# Patient Record
Sex: Male | Born: 1950 | Race: White | Hispanic: No | Smoking: Former smoker
Health system: Southern US, Community
[De-identification: ages and names within clinical notes are randomized; demographics above are authoritative.]

## PROBLEM LIST (undated history)

## (undated) DIAGNOSIS — R519 Headache, unspecified: Secondary | ICD-10-CM

## (undated) DIAGNOSIS — I714 Abdominal aortic aneurysm, without rupture, unspecified: Secondary | ICD-10-CM

## (undated) DIAGNOSIS — I10 Essential (primary) hypertension: Secondary | ICD-10-CM

## (undated) DIAGNOSIS — N429 Disorder of prostate, unspecified: Secondary | ICD-10-CM

## (undated) DIAGNOSIS — I719 Aortic aneurysm of unspecified site, without rupture: Secondary | ICD-10-CM

## (undated) DIAGNOSIS — M1990 Unspecified osteoarthritis, unspecified site: Secondary | ICD-10-CM

## (undated) DIAGNOSIS — J431 Panlobular emphysema: Secondary | ICD-10-CM

## (undated) DIAGNOSIS — K219 Gastro-esophageal reflux disease without esophagitis: Secondary | ICD-10-CM

## (undated) DIAGNOSIS — J449 Chronic obstructive pulmonary disease, unspecified: Secondary | ICD-10-CM

## (undated) DIAGNOSIS — I219 Acute myocardial infarction, unspecified: Secondary | ICD-10-CM

## (undated) DIAGNOSIS — J189 Pneumonia, unspecified organism: Secondary | ICD-10-CM

## (undated) DIAGNOSIS — F32A Depression, unspecified: Secondary | ICD-10-CM

## (undated) DIAGNOSIS — F329 Major depressive disorder, single episode, unspecified: Secondary | ICD-10-CM

## (undated) DIAGNOSIS — N400 Enlarged prostate without lower urinary tract symptoms: Secondary | ICD-10-CM

## (undated) DIAGNOSIS — R0600 Dyspnea, unspecified: Secondary | ICD-10-CM

## (undated) DIAGNOSIS — R51 Headache: Secondary | ICD-10-CM

## (undated) DIAGNOSIS — Z9981 Dependence on supplemental oxygen: Secondary | ICD-10-CM

## (undated) HISTORY — DX: Benign prostatic hyperplasia without lower urinary tract symptoms: N40.0

## (undated) HISTORY — DX: Abdominal aortic aneurysm, without rupture: I71.4

## (undated) HISTORY — DX: Panlobular emphysema: J43.1

## (undated) HISTORY — DX: Abdominal aortic aneurysm, without rupture, unspecified: I71.40

## (undated) HISTORY — PX: CARDIAC CATHETERIZATION: SHX172

## (undated) HISTORY — PX: LAPAROTOMY: SHX154

---

## 2013-07-09 DIAGNOSIS — I2510 Atherosclerotic heart disease of native coronary artery without angina pectoris: Secondary | ICD-10-CM | POA: Insufficient documentation

## 2013-07-09 DIAGNOSIS — I10 Essential (primary) hypertension: Secondary | ICD-10-CM | POA: Insufficient documentation

## 2013-07-14 DIAGNOSIS — D329 Benign neoplasm of meninges, unspecified: Secondary | ICD-10-CM | POA: Insufficient documentation

## 2013-07-14 HISTORY — PX: BRAIN SURGERY: SHX531

## 2014-12-13 ENCOUNTER — Emergency Department (HOSPITAL_COMMUNITY)
Admission: EM | Admit: 2014-12-13 | Discharge: 2014-12-13 | Disposition: A | Discharge status: Home / Self Care | Payer: Medicaid Other | Attending: Emergency Medicine | Admitting: Emergency Medicine

## 2014-12-13 ENCOUNTER — Encounter (HOSPITAL_COMMUNITY): Payer: Self-pay | Admitting: Emergency Medicine

## 2014-12-13 DIAGNOSIS — N401 Enlarged prostate with lower urinary tract symptoms: Secondary | ICD-10-CM | POA: Diagnosis not present

## 2014-12-13 DIAGNOSIS — R319 Hematuria, unspecified: Secondary | ICD-10-CM | POA: Diagnosis present

## 2014-12-13 DIAGNOSIS — J449 Chronic obstructive pulmonary disease, unspecified: Secondary | ICD-10-CM | POA: Diagnosis not present

## 2014-12-13 DIAGNOSIS — R3915 Urgency of urination: Secondary | ICD-10-CM

## 2014-12-13 HISTORY — DX: Disorder of prostate, unspecified: N42.9

## 2014-12-13 HISTORY — DX: Aortic aneurysm of unspecified site, without rupture: I71.9

## 2014-12-13 HISTORY — DX: Chronic obstructive pulmonary disease, unspecified: J44.9

## 2014-12-13 LAB — BASIC METABOLIC PANEL
Anion gap: 10 (ref 5–15)
BUN: 10 mg/dL (ref 6–20)
CO2: 26 mmol/L (ref 22–32)
CREATININE: 0.92 mg/dL (ref 0.61–1.24)
Calcium: 9.4 mg/dL (ref 8.9–10.3)
Chloride: 98 mmol/L — ABNORMAL LOW (ref 101–111)
GFR calc Af Amer: >60 mL/min (ref >60)
Glucose, Bld: 122 mg/dL — ABNORMAL HIGH (ref 65–99)
Potassium: 4.6 mmol/L (ref 3.5–5.1)
SODIUM: 134 mmol/L — AB (ref 135–145)

## 2014-12-13 LAB — URINALYSIS, ROUTINE W REFLEX MICROSCOPIC
BILIRUBIN URINE: NEGATIVE
Glucose, UA: NEGATIVE mg/dL
KETONES UR: NEGATIVE mg/dL
Leukocytes, UA: NEGATIVE
Nitrite: NEGATIVE
Protein, ur: NEGATIVE mg/dL
UROBILINOGEN UA: 1 mg/dL (ref 0.0–1.0)
pH: 6.5 (ref 5.0–8.0)

## 2014-12-13 LAB — CBC WITH DIFFERENTIAL/PLATELET
BASOS PCT: 1 %
Basophils Absolute: 0.1 10*3/uL (ref 0.0–0.1)
EOS ABS: 0.3 10*3/uL (ref 0.0–0.7)
EOS PCT: 3 %
HCT: 45.6 % (ref 39.0–52.0)
Hemoglobin: 15.2 g/dL (ref 13.0–17.0)
LYMPHS ABS: 2.3 10*3/uL (ref 0.7–4.0)
Lymphocytes Relative: 23 %
MCH: 29.2 pg (ref 26.0–34.0)
MCHC: 33.3 g/dL (ref 30.0–36.0)
MCV: 87.7 fL (ref 78.0–100.0)
MONOS PCT: 11 %
Monocytes Absolute: 1.1 10*3/uL — ABNORMAL HIGH (ref 0.1–1.0)
Neutro Abs: 6.1 10*3/uL (ref 1.7–7.7)
Neutrophils Relative %: 62 %
PLATELETS: 313 10*3/uL (ref 150–400)
RBC: 5.2 MIL/uL (ref 4.22–5.81)
RDW: 15.6 % — ABNORMAL HIGH (ref 11.5–15.5)
WBC: 9.8 10*3/uL (ref 4.0–10.5)

## 2014-12-13 LAB — URINE MICROSCOPIC-ADD ON

## 2014-12-13 NOTE — ED Notes (Signed)
Pt c/o blood in urine onset last week. Pt also has been having problems urinating where he has to catheterize himself.

## 2014-12-13 NOTE — ED Notes (Signed)
Bladder scan showed 393 ML.

## 2014-12-13 NOTE — ED Provider Notes (Signed)
Medical screening examination/treatment/procedure(s) were conducted as a shared visit with non-physician practitioner(s) and myself.  I personally evaluated the patient during the encounter. 64 yo M w/ h/o BPH and has to self catheterize once in awhile.had that need yesterday, hematuria since then. No pain. Had clots initially, improved since then. Here without abdominal pain or discomfort. No ttp. No tachycardia. Rest of exam normal. Will plan to check urine to ensure no large blood and dc.   Merrily Pew, MD 12/16/14 2203

## 2014-12-13 NOTE — ED Provider Notes (Signed)
CSN: [id number]     Arrival date & time 12/13/14  1201 History   First MD Initiated Contact with Patient 12/13/14 1219     Chief Complaint  Patient presents with  . Hematuria     (Consider location/radiation/quality/duration/timing/severity/associated sxs/prior Treatment) HPI Comments: Jay Mullins is a 64 y.o. male with a PMHx of aortic aneurysm, BPH, anxiety, insomnia, GERD, meningioma s/p resection, and COPD, who presents to the ED with complaints of hematuria 2 days. He reports he has had this in the past, but the last 2 days there've been clots present in the hematuria, which started after he catheterized himself. He describes a sensation of increased urinary frequency and urgency. He states that last year he was stopped on finasteride and Flomax and ever since then he has had more issues with urination. He denies any urinary retention or dysuria, fevers, chills, chest pain, shortness breath, abdominal pain, nausea, vomiting, diarrhea, constipation, melena, hematochezia, flank pain, testicular pain or swelling, penile discharge, numbness, tingling, or weakness. He is not on any blood thinners.  Patient is a 64 y.o. male presenting with hematuria. The history is provided by the patient. No language interpreter was used.  Hematuria This is a new problem. The current episode started in the past 7 days. The problem occurs constantly. The problem has been unchanged. Associated symptoms include urinary symptoms. Pertinent negatives include no abdominal pain, arthralgias, chest pain, chills, fever, myalgias, nausea, numbness, vomiting or weakness. Nothing aggravates the symptoms. He has tried nothing for the symptoms. The treatment provided no relief.    Past Medical History  Diagnosis Date  . Aortic aneurysm ([employer])   . Prostate disorder   . COPD (chronic obstructive pulmonary disease) (H[facility])    No past surgical history on file. No family history on file. Social History  Substance Use Topics   . Smoking status: Never Smoker   . Smokeless tobacco: None  . Alcohol Use: No    Review of Systems  Constitutional: Negative for fever and chills.  Respiratory: Negative for shortness of breath.   Cardiovascular: Negative for chest pain.  Gastrointestinal: Negative for nausea, vomiting, abdominal pain, diarrhea, constipation and blood in stool.  Genitourinary: Positive for urgency, frequency and hematuria. Negative for dysuria, flank pain, discharge, scrotal swelling, difficulty urinating, penile pain and testicular pain.  Musculoskeletal: Negative for myalgias and arthralgias.  Skin: Negative for color change.  Allergic/Immunologic: Negative for immunocompromised state.  Neurological: Negative for weakness and numbness.  Psychiatric/Behavioral: Negative for confusion.   10 Systems reviewed and are negative for acute change except as noted in the HPI.    Allergies  Review of patient's allergies indicates no known allergies.  Home Medications   Prior to Admission medications   Not on File   BP 112/76 mmHg  Pulse 79  Temp(Src) 98.1 F (36.7 C) (Oral)  Resp 18  Ht 5\' 9"  (1.753 m)  Wt 160 lb (72.576 kg)  BMI 23.62 kg/m2  SpO2 94%   Physical Exam  Constitutional: He is oriented to person, place, and time. Vital signs are normal. He appears well-developed and well-nourished.  Non-toxic appearance. No distress.  Afebrile, nontoxic, NAD  HENT:  Head: Normocephalic and atraumatic.  Mouth/Throat: Oropharynx is clear and moist and mucous membranes are normal.  Eyes: Conjunctivae and EOM are normal. Right eye exhibits no discharge. Left eye exhibits no discharge.  Neck: Normal range of motion. Neck supple.  Cardiovascular: Normal rate, regular rhythm, normal heart sounds and intact distal pulses.  Exam reveals no  gallop and no friction rub.   No murmur heard. Pulmonary/Chest: Effort normal. No respiratory distress. He has no decreased breath sounds. He has wheezes. He has no  rhonchi. He has no rales.  Faint expiratory wheezing, no rales/rhonchi, no increased WOB, no hypoxia, SpO2 94% on RA  Abdominal: Soft. Normal appearance and bowel sounds are normal. He exhibits no distension. There is no tenderness. There is no rigidity, no rebound, no guarding, no CVA tenderness, no tenderness at McBurney's point and negative Murphy's sign.  Soft, NTND, +BS throughout, no r/g/r, neg murphy's, neg mcburney's, no CVA TTP   Musculoskeletal: Normal range of motion.  Neurological: He is alert and oriented to person, place, and time. He has normal strength. No sensory deficit.  Skin: Skin is warm, dry and intact. No rash noted.  Psychiatric: He has a normal mood and affect.  Nursing note and vitals reviewed.   ED Course  Procedures (including critical care time) Labs Review Labs Reviewed  URINALYSIS, ROUTINE W REFLEX MICROSCOPIC (NOT AT [facility]) - Abnormal; Notable for the following:    Specific Gravity, Urine <1.005 (*)    Hgb urine dipstick LARGE (*)    All other components within normal limits  CBC WITH DIFFERENTIAL/PLATELET - Abnormal; Notable for the following:    RDW 15.6 (*)    Monocytes Absolute 1.1 (*)    All other components within normal limits  BASIC METABOLIC PANEL - Abnormal; Notable for the following:    Sodium 134 (*)    Chloride 98 (*)    Glucose, Bld 122 (*)    All other components within normal limits  URINE MICROSCOPIC-ADD ON    Bladder scan: 393 ML.  Imaging Review No results found. I have personally reviewed and evaluated these images and lab results as part of my medical decision-making.   EKG Interpretation None      MDM   Final diagnoses:  Hematuria  Benign prostatic hyperplasia (BPH) with urinary urgency    64 y.o. male here with hematuria and urinary frequency/urgency, no dysuria. Had clots of blood come out with the hematuria, likely from having to self-cath. No flank pain or tenderness. Will get bladder scan, U/A, and basic labs  to assess kidney function, then reassess.   2:31 PM U/A without signs of infection, shows hematuria 21-50 RBC/hpf. CBC w/diff unremarkable. BMP with retained kidney function and no acute abnormalities. Discussed option of starting flomax again to help with his BPH, he states he will continue this at home. Discussed being delicate with his catheterizing. F/up with his urologist in 3-5 days. I explained the diagnosis and have given explicit precautions to return to the ER including for any other new or worsening symptoms. The patient understands and accepts the medical plan as it's been dictated and I have answered their questions. Discharge instructions concerning home care and prescriptions have been given. The patient is STABLE and is discharged to home in good condition.  BP 115/64 mmHg  Pulse 74  Temp(Src) 98.1 F (36.7 C) (Oral)  Resp 18  Ht 5\' 9"  (1.753 m)  Wt 160 lb (72.576 kg)  BMI 23.62 kg/m2  SpO2 93%  No orders of the defined types were placed in this encounter.     Cheril Slattery Camprubi-Soms, PA-C 12/13/14 1442  Merrily Pew, MD 12/16/14 2201

## 2014-12-13 NOTE — Discharge Instructions (Signed)
Take your home flomax as directed. Stay well hydrated. Be gentle when you catheterize yourself. Follow up with your urologist in 5 days for recheck of symptoms and ongoing management. Return to the ER for changes or worsening symptoms.   Benign Prostatic Hyperplasia An enlarged prostate (benign prostatic hyperplasia) is common in older men. You may experience the following:  Weak urine stream.  Dribbling.  Feeling like the bladder has not emptied completely.  Difficulty starting urination.  Getting up frequently at night to urinate.  Urinating more frequently during the day. HOME CARE INSTRUCTIONS  Monitor your prostatic hyperplasia for any changes. The following actions may help to alleviate any discomfort you are experiencing:  Give yourself time when you urinate.  Stay away from alcohol.  Avoid beverages containing caffeine, such as coffee, tea, and colas, because they can make the problem worse.  Avoid decongestants, antihistamines, and some prescription medicines that can make the problem worse.  Follow up with your health care provider for further treatment as recommended. SEEK MEDICAL CARE IF:  You are experiencing progressive difficulty voiding.  Your urine stream is progressively getting narrower.  You are awaking from sleep with the urge to void more frequently.  You are constantly feeling the need to void.  You experience loss of urine, especially in small amounts. SEEK IMMEDIATE MEDICAL CARE IF:   You develop increased pain with urination or are unable to urinate.  You develop severe abdominal pain, vomiting, a high fever, or fainting.  You develop back pain or blood in your urine. MAKE SURE YOU:   Understand these instructions.  Will watch your condition.  Will get help right away if you are not doing well or get worse.   This information is not intended to replace advice given to you by your health care provider. Make sure you discuss any questions  you have with your health care provider.   Document Released: 02/20/2005 Document Revised: 03/13/2014 Document Reviewed: 07/23/2012 Elsevier Interactive Patient Education 2016 Elsevier Inc.  Hematuria, Adult Hematuria is blood in your urine. It can be caused by a bladder infection, kidney infection, prostate infection, kidney stone, or cancer of your urinary tract. Infections can usually be treated with medicine, and a kidney stone usually will pass through your urine. If neither of these is the cause of your hematuria, further workup to find out the reason may be needed. It is very important that you tell your health care provider about any blood you see in your urine, even if the blood stops without treatment or happens without causing pain. Blood in your urine that happens and then stops and then happens again can be a symptom of a very serious condition. Also, pain is not a symptom in the initial stages of many urinary cancers. HOME CARE INSTRUCTIONS   Drink lots of fluid, 3-4 quarts a day. If you have been diagnosed with an infection, cranberry juice is especially recommended, in addition to large amounts of water.  Avoid caffeine, tea, and carbonated beverages because they tend to irritate the bladder.  Avoid alcohol because it may irritate the prostate.  Take all medicines as directed by your health care provider.  If you were prescribed an antibiotic medicine, finish it all even if you start to feel better.  If you have been diagnosed with a kidney stone, follow your health care provider's instructions regarding straining your urine to catch the stone.  Empty your bladder often. Avoid holding urine for long periods of time.  After  a bowel movement, women should cleanse front to back. Use each tissue only once.  Empty your bladder before and after sexual intercourse if you are a male. SEEK MEDICAL CARE IF:  You develop back pain.  You have a fever.  You have a feeling of  sickness in your stomach (nausea) or vomiting.  Your symptoms are not better in 3 days. Return sooner if you are getting worse. SEEK IMMEDIATE MEDICAL CARE IF:   You develop severe vomiting and are unable to keep the medicine down.  You develop severe back or abdominal pain despite taking your medicines.  You begin passing a large amount of blood or clots in your urine.  You feel extremely weak or faint, or you pass out. MAKE SURE YOU:   Understand these instructions.  Will watch your condition.  Will get help right away if you are not doing well or get worse.   This information is not intended to replace advice given to you by your health care provider. Make sure you discuss any questions you have with your health care provider.   Document Released: 02/20/2005 Document Revised: 03/13/2014 Document Reviewed: 10/21/2012 Elsevier Interactive Patient Education [employer].

## 2014-12-22 ENCOUNTER — Encounter: Payer: Self-pay | Admitting: Vascular Surgery

## 2014-12-25 ENCOUNTER — Encounter: Payer: Self-pay | Admitting: Vascular Surgery

## 2015-01-11 ENCOUNTER — Encounter: Payer: Self-pay | Admitting: Vascular Surgery

## 2015-01-13 ENCOUNTER — Ambulatory Visit (INDEPENDENT_AMBULATORY_CARE_PROVIDER_SITE_OTHER): Payer: Medicaid Other | Admitting: Vascular Surgery

## 2015-01-13 ENCOUNTER — Encounter: Payer: Self-pay | Admitting: Vascular Surgery

## 2015-01-13 VITALS — BP 136/84 | HR 77 | Temp 97.6°F | Resp 16 | Ht 69.0 in | Wt 156.0 lb

## 2015-01-13 DIAGNOSIS — Z716 Tobacco abuse counseling: Secondary | ICD-10-CM | POA: Diagnosis not present

## 2015-01-13 DIAGNOSIS — I714 Abdominal aortic aneurysm, without rupture, unspecified: Secondary | ICD-10-CM

## 2015-01-13 NOTE — Patient Instructions (Signed)
Jay Jacobsen, RN [facility]   [phone]   Abdominal Aortic Aneurysm An aneurysm is a weakened or damaged part of an artery wall that bulges from the normal force of blood pumping through the body. An abdominal aortic aneurysm is an aneurysm that occurs in the lower part of the aorta, the main artery of the body.  The major concern with an abdominal aortic aneurysm is that it can enlarge and burst (rupture) or blood can flow between the layers of the wall of the aorta through a tear (aorticdissection). Both of these conditions can cause bleeding inside the body and can be life threatening unless diagnosed and treated promptly. CAUSES  The exact cause of an abdominal aortic aneurysm is unknown. Some contributing factors are:   A hardening of the arteries caused by the buildup of fat and other substances in the lining of a blood vessel (arteriosclerosis).  Inflammation of the walls of an artery (arteritis).   Connective tissue diseases, such as Marfan syndrome.   Abdominal trauma.   An infection, such as syphilis or staphylococcus, in the wall of the aorta (infectious aortitis) caused by bacteria. RISK FACTORS  Risk factors that contribute to an abdominal aortic aneurysm may include:  Age older than 61 years.   High blood pressure (hypertension).  Male gender.  Ethnicity (white race).  Obesity.  Family history of aneurysm (first degree relatives only).  Tobacco use. PREVENTION  The following healthy lifestyle habits may help decrease your risk of abdominal aortic aneurysm:  Quitting smoking. Smoking can raise your blood pressure and cause arteriosclerosis.  Limiting or avoiding alcohol.  Keeping your blood pressure, blood sugar level, and cholesterol levels within normal limits.  Decreasing your salt intake. In somepeople, too much salt can raise blood pressure and increase your risk of abdominal aortic  aneurysm.  Eating a diet low in saturated fats and cholesterol.  Increasing your fiber intake by including whole grains, vegetables, and fruits in your diet. Eating these foods may help lower blood pressure.  Maintaining a healthy weight.  Staying physically active and exercising regularly. SYMPTOMS  The symptoms of abdominal aortic aneurysm may vary depending on the size and rate of growth of the aneurysm.Most grow slowly and do not have any symptoms. When symptoms do occur, they may include:  Pain (abdomen, side, lower back, or groin). The pain may vary in intensity. A sudden onset of severe pain may indicate that the aneurysm has ruptured.  Feeling full after eating only small amounts of food.  Nausea or vomiting or both.  Feeling a pulsating lump in the abdomen.  Feeling faint or passing out. DIAGNOSIS  Since most unruptured abdominal aortic aneurysms have no symptoms, they are often discovered during diagnostic exams for other conditions. An aneurysm may be found during the following procedures:  Ultrasonography (A one-time screening for abdominal aortic aneurysm by ultrasonography is also recommended for all men aged 55-75 years who have ever smoked).  X-ray exams.  A computed tomography (CT).  Magnetic resonance imaging (MRI).  Angiography or arteriography. TREATMENT  Treatment of an abdominal aortic aneurysm depends on the size of your aneurysm, your age, and risk factors for rupture. Medication to control blood pressure and pain may be used to manage aneurysms smaller than 6 cm. Regular monitoring for enlargement may be recommended by your caregiver if:  The aneurysm is 3-4 cm in size (an annual ultrasonography may be recommended).  The aneurysm is 4-4.5 cm in size (an ultrasonography  every 6 months may be recommended).  The aneurysm is larger than 4.5 cm in size (your caregiver may ask that you be examined by a vascular surgeon). If your aneurysm is larger than 6  cm, surgical repair may be recommended. There are two main methods for repair of an aneurysm:   Endovascular repair (a minimally invasive surgery). This is done most often.  Open repair. This method is used if an endovascular repair is not possible.   This information is not intended to replace advice given to you by your health care provider. Make sure you discuss any questions you have with your health care provider.

## 2015-01-13 NOTE — Progress Notes (Signed)
[facility]  Patient name: Jay Mullins MRN: [id number] DOB: 04-11-1950 Sex: male  REASON FOR CONSULT: 5 cm abdominal aortic aneurysm.  HPI: Jay Mullins is a 64 y.o. male, who had been followed with an abdominal aortic aneurysm in [place]. He has recently moved to this area and wished [place] to follow his aneurysm. His most recent CT scan was on 10/14/2014. This showed that the maximum diameter of his aneurysm was 5.0 cm. In March 2015, the aneurysm was 3.9 cm in maximum diameter. A subsequent CT which I do not have the report showed that the aneurysm had enlarged to 4.5 cm.  The patient denies any abdominal pain or back pain. He has no family history of aneurysmal disease.  He does have a history of hypertension, hypercholesterolemia, and a "light heart attack" over 20 years ago.  I have reviewed all the records that were sent with him from [facility]. He has undergone previous craniotomy for a benign brain tumor.  Past Medical History  Diagnosis Date  . Aortic aneurysm ([place])   . Prostate disorder   . COPD (chronic obstructive pulmonary disease) ([place])   . AAA (abdominal aortic aneurysm) ([place])     infrarenal AAA  . Benign prostatic hyperplasia   . Panlobular emphysema (H[facility])     Family History  Problem Relation Age of Onset  . COPD Sister     FAMILY HISTORY:. He denies any family history of premature cardiovascular disease or aneurysmal disease.  PAST SURGICAL HISTORY: He is undergone previous surgery in his abdomen for a bleeding ulcer.  SOCIAL HISTORY: he smokes over a pack per day of cigarettes and has been smoking for 35 years. Social History   Social History  . Marital Status: Married    Spouse Name: N/A  . Number of Children: N/A  . Years of Education: N/A   Occupational History  . Not on file.   Social History Main Topics  . Smoking status: Current Every Day Smoker  . Smokeless tobacco: Never Used  . Alcohol Use: No  . Drug  Use: No  . Sexual Activity: Not on file   Other Topics Concern  . Not on file   Social History Narrative    Allergies  Allergen Reactions  . Ativan [Lorazepam] Other (See Comments)    hallucinations    Current Outpatient Prescriptions  Medication Sig Dispense Refill  . albuterol (PROAIR HFA) 108 (90 BASE) MCG/ACT inhaler Inhale 2 puffs into the lungs every 4 (four) hours as needed for wheezing or shortness of breath. Received from [facility]    . ALPRAZolam (XANAX) 1 MG tablet TAKE 1 TABLET BY MOUTH EVERY NIGHT AT BEDTIME AS NEEDED FOR SLEEP    . budesonide-formoterol (SYMBICORT) 160-4.5 MCG/ACT inhaler Inhale 2 puffs into the lungs.    . butalbital-acetaminophen-caffeine (FIORICET, ESGIC) 50-325-40 MG tablet TAKE 2 TABLETS BY MOUTH EVERY 4 HOURS AS NEEDED FOR PAIN OR HEADACHE    . Cholecalciferol (D 1000) 1000 UNITS capsule Take by mouth.    . colchicine 0.6 MG tablet TAKE 1 TABLET BY MOUTH THREE TIMES DAILY    . escitalopram (LEXAPRO) 20 MG tablet TAKE 1 TABLET BY MOUTH EVERY DAY    . HYDROcodone-homatropine (HYCODAN) 5-1.5 MG/5ML syrup Take 1-2 teaspoons (23mL to 82mL) at bedtime as needed for cough.    Marland Kitchen ipratropium-albuterol (DUONEB) 0.5-2.5 (3) MG/3ML SOLN 3 mLs.    Marland Kitchen lisinopril (PRINIVIL,ZESTRIL) 10 MG tablet TAKE 1 TABLET BY MOUTH EVERY DAY    .  pantoprazole (PROTONIX) 40 MG tablet Take 40 mg by mouth daily. Received from [facility]    . pravastatin (PRAVACHOL) 20 MG tablet Take 20 mg by mouth daily. Received from [facility]    . tamsulosin (FLOMAX) 0.4 MG CAPS capsule Take 0.4 mg by mouth. Received from [facility]    . Tiotropium Bromide Monohydrate 2.5 MCG/ACT AERS Inhale 1 puff into the lungs daily. Received from [facility]    . vitamin B-12 (CYANOCOBALAMIN) 1000 MCG tablet Take 1,000 mcg by mouth daily. Received from [facility]    . vitamin C (ASCORBIC ACID) 250 MG tablet Take 250 mg by mouth daily. Received from [facility]     No current  facility-administered medications for this visit.    REVIEW OF SYSTEMS:  [X]  denotes positive finding, [ ]  denotes negative finding Cardiac  Comments:  Chest pain or chest pressure:    Shortness of breath upon exertion: X   Short of breath when lying flat: X   Irregular heart rhythm:        Vascular    Pain in calf, thigh, or hip brought on by ambulation:    Pain in feet at night that wakes you up from your sleep:     Blood clot in your veins:    Leg swelling:         Pulmonary    Oxygen at home:    Productive cough:  X   Wheezing:  X       Neurologic    Sudden weakness in arms or legs:     Sudden numbness in arms or legs:     Sudden onset of difficulty speaking or slurred speech:    Temporary loss of vision in one eye:     Problems with dizziness:  X       Gastrointestinal    Blood in stool:     Vomited blood:         Genitourinary    Burning when urinating:     Blood in urine:        Psychiatric    Major depression:         Hematologic    Bleeding problems:    Problems with blood clotting too easily:        Skin    Rashes or ulcers:        Constitutional    Fever or chills:      PHYSICAL EXAM: Filed Vitals:   01/13/15 1023  BP: 136/84  Pulse: 77  Temp: 97.6 F (36.4 C)  TempSrc: Oral  Resp: 16  Height: 5\' 9"  (1.753 m)  Weight: 156 lb (70.761 kg)  SpO2: 95%    GENERAL: The patient is a well-nourished male, in no acute distress. The vital signs are documented above. CARDIAC: There is a regular rate and rhythm.  VASCULAR: I did not detect carotid bruits. On the right side, he has a palpable femoral, popliteal, and posterior tibial pulse. On the left side, he has a palpable femoral and popliteal pulse. I cannot palpate a dorsalis pedis or posterior tibial pulse. He has no significant lower extremity swelling. PULMONARY: There is good air exchange bilaterally without wheezing or rales. ABDOMEN: Soft and non-tender with normal pitched bowel sounds.  His aneurysm is nontender. He has an upper midline incision which is healed. MUSCULOSKELETAL: There are no major deformities or cyanosis. NEUROLOGIC: No focal weakness or paresthesias are detected. SKIN: There are no ulcers or rashes noted. He has no  evidence of atheroembolic disease. PSYCHIATRIC: The patient has a normal affect.  DATA:  He brought his Ct scans with him and I have reviewed these images. His most recent study on 10/14/2014 shows a 5.0 cm infrarenal abdominal aortic aneurysm. This study was done without contrast.  MEDICAL ISSUES:  5.0 CM INFRARENAL ABDOMINAL AORTIC ANEURYSM: I have explained that in a normal risk patient we would recommend elective repair at 5.5 cm. We have discussed the importance of tobacco cessation and I spent greater than 3 minutes on this discussion. We have discussed the importance of tight blood pressure control. I have ordered a follow up CT scan in 6 months and I'll see him back at that time. If the aneurysm continues to enlarge and I think we should consider elective repair. His CT scan with contrast will help me determine if he is a candidate for an endovascular repair. He knows to call sooner if he has problems.   Deitra Mayo [facility] [place]: [phone]

## 2015-03-10 DIAGNOSIS — F17200 Nicotine dependence, unspecified, uncomplicated: Secondary | ICD-10-CM | POA: Diagnosis present

## 2015-03-10 DIAGNOSIS — J441 Chronic obstructive pulmonary disease with (acute) exacerbation: Secondary | ICD-10-CM | POA: Diagnosis present

## 2015-06-16 ENCOUNTER — Inpatient Hospital Stay: Admission: RE | Admit: 2015-06-16 | Payer: Medicaid Other | Source: Ambulatory Visit

## 2015-06-16 ENCOUNTER — Encounter: Payer: Self-pay | Admitting: Vascular Surgery

## 2015-06-22 ENCOUNTER — Ambulatory Visit
Admission: RE | Admit: 2015-06-22 | Discharge: 2015-06-22 | Disposition: A | Discharge status: Home / Self Care | Payer: Medicaid Other | Source: Ambulatory Visit | Attending: Vascular Surgery | Admitting: Vascular Surgery

## 2015-06-22 DIAGNOSIS — I714 Abdominal aortic aneurysm, without rupture, unspecified: Secondary | ICD-10-CM

## 2015-06-22 MED ORDER — IOPAMIDOL (ISOVUE-370) INJECTION 76%
80.0000 mL | Freq: Once | INTRAVENOUS | Status: AC | PRN
  Administered 2015-06-22: 80 mL via INTRAVENOUS

## 2015-06-23 ENCOUNTER — Encounter: Payer: Self-pay | Admitting: Vascular Surgery

## 2015-06-23 ENCOUNTER — Ambulatory Visit (INDEPENDENT_AMBULATORY_CARE_PROVIDER_SITE_OTHER): Payer: Medicaid Other | Admitting: Vascular Surgery

## 2015-06-23 VITALS — BP 130/82 | HR 48 | Temp 98.3°F | Resp 18 | Ht 69.0 in | Wt 151.0 lb

## 2015-06-23 DIAGNOSIS — I714 Abdominal aortic aneurysm, without rupture, unspecified: Secondary | ICD-10-CM

## 2015-06-23 NOTE — Progress Notes (Signed)
Vascular and Vein Specialist of [facility]  Patient name: Jay Mullins MRN: [id number] DOB: 1950/04/28 Sex: male  REASON FOR VISIT: Follow up of abdominal aortic aneurysm.  HPI: Jay Mullins is a 65 y.o. male who I last saw on 01/13/2015. He had been followed with an abdominal aortic aneurysm in [facility] but recently moved to this area. His most recent study on 10/14/2014 showed that his aneurysm was 5.0 cm in maximum diameter. The study was done without contrast. I explained that a normal wrist patient we would consider elective repair at 5.5 cm. We also discussed the importance of tobacco cessation. I ranged for a follow up CT scan in 6 months with contrast.  Since I saw him last, he denies any history of abdominal pain or back pain. He does continue to smoke a pack per day of cigarettes.  Past Medical History  Diagnosis Date  . Aortic aneurysm ([place])   . Prostate disorder   . COPD (chronic obstructive pulmonary disease) ([place])   . AAA (abdominal aortic aneurysm) ([place])     infrarenal AAA  . Benign prostatic hyperplasia   . Panlobular emphysema ([facility])     Family History  Problem Relation Age of Onset  . COPD Sister     SOCIAL HISTORY: Social History  Substance Use Topics  . Smoking status: Current Every Day Smoker  . Smokeless tobacco: Never Used  . Alcohol Use: No    Allergies  Allergen Reactions  . Ativan [Lorazepam] Other (See Comments)    hallucinations    Current Outpatient Prescriptions  Medication Sig Dispense Refill  . albuterol (PROAIR HFA) 108 (90 BASE) MCG/ACT inhaler Inhale 2 puffs into the lungs every 4 (four) hours as needed for wheezing or shortness of breath. Received from [facility]    . ALPRAZolam (XANAX) 1 MG tablet TAKE 1 TABLET BY MOUTH EVERY NIGHT AT BEDTIME AS NEEDED FOR SLEEP    . budesonide-formoterol (SYMBICORT) 160-4.5 MCG/ACT inhaler Inhale 2 puffs into the lungs.    . butalbital-acetaminophen-caffeine (FIORICET, ESGIC)  50-325-40 MG tablet TAKE 2 TABLETS BY MOUTH EVERY 4 HOURS AS NEEDED FOR PAIN OR HEADACHE    . Cholecalciferol (D 1000) 1000 UNITS capsule Take by mouth.    . colchicine 0.6 MG tablet TAKE 1 TABLET BY MOUTH THREE TIMES DAILY    . escitalopram (LEXAPRO) 20 MG tablet TAKE 1 TABLET BY MOUTH EVERY DAY    . ipratropium-albuterol (DUONEB) 0.5-2.5 (3) MG/3ML SOLN 3 mLs.    Marland Kitchen lisinopril (PRINIVIL,ZESTRIL) 10 MG tablet TAKE 1 TABLET BY MOUTH EVERY DAY    . pantoprazole (PROTONIX) 40 MG tablet Take 40 mg by mouth daily. Received from [facility]    . pravastatin (PRAVACHOL) 20 MG tablet Take 20 mg by mouth daily. Received from [facility]    . tamsulosin (FLOMAX) 0.4 MG CAPS capsule Take 0.4 mg by mouth. Received from [facility]    . Tiotropium Bromide Monohydrate 2.5 MCG/ACT AERS Inhale 1 puff into the lungs daily. Received from [facility]    . vitamin B-12 (CYANOCOBALAMIN) 1000 MCG tablet Take 1,000 mcg by mouth daily. Received from [facility]    . vitamin C (ASCORBIC ACID) 250 MG tablet Take 250 mg by mouth daily. Received from [facility]    . HYDROcodone-homatropine (HYCODAN) 5-1.5 MG/5ML syrup Reported on 06/23/2015     No current facility-administered medications for this visit.    REVIEW OF SYSTEMS:  [X]  denotes positive finding, [ ]  denotes negative finding  Cardiac  Comments:  Chest pain or chest pressure:    Shortness of breath upon exertion: X   Short of breath when lying flat:    Irregular heart rhythm:        Vascular    Pain in calf, thigh, or hip brought on by ambulation:    Pain in feet at night that wakes you up from your sleep:     Blood clot in your veins:    Leg swelling:         Pulmonary    Oxygen at home: X   Productive cough:     Wheezing:         Neurologic    Sudden weakness in arms or legs:     Sudden numbness in arms or legs:     Sudden onset of difficulty speaking or slurred speech:    Temporary loss of vision in one eye:     Problems with  dizziness:         Gastrointestinal    Blood in stool:     Vomited blood:         Genitourinary    Burning when urinating:     Blood in urine:        Psychiatric    Major depression:         Hematologic    Bleeding problems:    Problems with blood clotting too easily:        Skin    Rashes or ulcers:        Constitutional    Fever or chills:      PHYSICAL EXAM: Filed Vitals:   06/23/15 1121  BP: 130/82  Pulse: 48  Temp: 98.3 F (36.8 C)  Resp: 18  Height: 5\' 9"  (1.753 m)  Weight: 151 lb (68.493 kg)  SpO2: 99%    GENERAL: The patient is a well-nourished male, in no acute distress. The vital signs are documented above. CARDIAC: There is a regular rate and rhythm.  VASCULAR: I do not detect carotid bruits. He has palpable femoral, popliteal, and pedal pulses bilaterally. He has no significant lower extremity swelling. PULMONARY: There is good air exchange bilaterally without wheezing or rales. ABDOMEN: Soft and non-tender with normal pitched bowel sounds. His aneurysm is palpable and nontender. MUSCULOSKELETAL: There are no major deformities or cyanosis. NEUROLOGIC: No focal weakness or paresthesias are detected. SKIN: There are no ulcers or rashes noted. PSYCHIATRIC: The patient has a normal affect.  DATA:   CT ANGIOGRAM ABDOMEN PELVIS: have reviewed the images of his CAT scan. His aneurysm measures 5.2 cm in maximum diameter. The right common iliac artery measures 2.3 cm in maximum diameter. The left common iliac artery measures 2.0 cm in maximum diameter. Based on his CAT scan, if his aneurysm does enlarge enough to consider elective repair, I think he would be a candidate for an endovascular repair.  MEDICAL ISSUES:  5.2 CM INFRARENAL ABDOMINAL AORTIC ANEURYSM: his aneurysm has enlarged only 2 mm from August of last year. He understands we would not consider elective repair lesser reached 5.5 cm in maximum diameter. I ordered a follow up ultrasound in 6 months  and also him back at that time. If the aneurysm enlarges any further we should consider elective repair. Based on his CAT scan it appears that he would be a candidate for an endovascular repair. We have again discussed the importance of tobacco cessation. His blood pressure was mildly elevated today and we also discussed the importance  of keeping his blood pressure well controlled.   Deitra Mayo [facility] of [place]: [phone]

## 2015-07-30 ENCOUNTER — Other Ambulatory Visit: Payer: Self-pay | Admitting: *Deleted

## 2015-07-30 DIAGNOSIS — I714 Abdominal aortic aneurysm, without rupture, unspecified: Secondary | ICD-10-CM

## 2015-12-30 ENCOUNTER — Encounter: Payer: Self-pay | Admitting: Vascular Surgery

## 2016-01-05 ENCOUNTER — Encounter: Payer: Self-pay | Admitting: Vascular Surgery

## 2016-01-05 ENCOUNTER — Ambulatory Visit (INDEPENDENT_AMBULATORY_CARE_PROVIDER_SITE_OTHER): Payer: Medicaid Other | Admitting: Family

## 2016-01-05 ENCOUNTER — Ambulatory Visit (HOSPITAL_COMMUNITY)
Admission: RE | Admit: 2016-01-05 | Discharge: 2016-01-05 | Disposition: A | Discharge status: Home / Self Care | Payer: Medicaid Other | Source: Ambulatory Visit | Attending: Vascular Surgery | Admitting: Vascular Surgery

## 2016-01-05 VITALS — BP 138/86 | HR 68 | Temp 97.1°F | Ht 68.25 in | Wt 141.8 lb

## 2016-01-05 DIAGNOSIS — I723 Aneurysm of iliac artery: Secondary | ICD-10-CM | POA: Diagnosis not present

## 2016-01-05 DIAGNOSIS — I714 Abdominal aortic aneurysm, without rupture, unspecified: Secondary | ICD-10-CM

## 2016-01-05 DIAGNOSIS — F17200 Nicotine dependence, unspecified, uncomplicated: Secondary | ICD-10-CM

## 2016-01-05 NOTE — Patient Instructions (Addendum)
Abdominal Aortic Aneurysm An aneurysm is a weakened or damaged part of an artery wall that bulges from the normal force of blood pumping through the body. An abdominal aortic aneurysm is an aneurysm that occurs in the lower part of the aorta, the main artery of the body.  The major concern with an abdominal aortic aneurysm is that it can enlarge and burst (rupture) or blood can flow between the layers of the wall of the aorta through a tear (aorticdissection). Both of these conditions can cause bleeding inside the body and can be life threatening unless diagnosed and treated promptly. CAUSES  The exact cause of an abdominal aortic aneurysm is unknown. Some contributing factors are:   A hardening of the arteries caused by the buildup of fat and other substances in the lining of a blood vessel (arteriosclerosis).  Inflammation of the walls of an artery (arteritis).   Connective tissue diseases, such as Marfan syndrome.   Abdominal trauma.   An infection, such as syphilis or staphylococcus, in the wall of the aorta (infectious aortitis) caused by bacteria. RISK FACTORS  Risk factors that contribute to an abdominal aortic aneurysm may include:  Age older than 60 years.   High blood pressure (hypertension).  Male gender.  Ethnicity (white race).  Obesity.  Family history of aneurysm (first degree relatives only).  Tobacco use. PREVENTION  The following healthy lifestyle habits may help decrease your risk of abdominal aortic aneurysm:  Quitting smoking. Smoking can raise your blood pressure and cause arteriosclerosis.  Limiting or avoiding alcohol.  Keeping your blood pressure, blood sugar level, and cholesterol levels within normal limits.  Decreasing your salt intake. In somepeople, too much salt can raise blood pressure and increase your risk of abdominal aortic aneurysm.  Eating a diet low in saturated fats and cholesterol.  Increasing your fiber intake by including  whole grains, vegetables, and fruits in your diet. Eating these foods may help lower blood pressure.  Maintaining a healthy weight.  Staying physically active and exercising regularly. SYMPTOMS  The symptoms of abdominal aortic aneurysm may vary depending on the size and rate of growth of the aneurysm.Most grow slowly and do not have any symptoms. When symptoms do occur, they may include:  Pain (abdomen, side, lower back, or groin). The pain may vary in intensity. A sudden onset of severe pain may indicate that the aneurysm has ruptured.  Feeling full after eating only small amounts of food.  Nausea or vomiting or both.  Feeling a pulsating lump in the abdomen.  Feeling faint or passing out. DIAGNOSIS  Since most unruptured abdominal aortic aneurysms have no symptoms, they are often discovered during diagnostic exams for other conditions. An aneurysm may be found during the following procedures:  Ultrasonography (A one-time screening for abdominal aortic aneurysm by ultrasonography is also recommended for all men aged 65-75 years who have ever smoked).  X-ray exams.  A computed tomography (CT).  Magnetic resonance imaging (MRI).  Angiography or arteriography. TREATMENT  Treatment of an abdominal aortic aneurysm depends on the size of your aneurysm, your age, and risk factors for rupture. Medication to control blood pressure and pain may be used to manage aneurysms smaller than 6 cm. Regular monitoring for enlargement may be recommended by your caregiver if:  The aneurysm is 3-4 cm in size (an annual ultrasonography may be recommended).  The aneurysm is 4-4.5 cm in size (an ultrasonography every 6 months may be recommended).  The aneurysm is larger than 4.5 cm in   size (your caregiver may ask that you be examined by a vascular surgeon). If your aneurysm is larger than 6 cm, surgical repair may be recommended. There are two main methods for repair of an aneurysm:   Endovascular  repair (a minimally invasive surgery). This is done most often.  Open repair. This method is used if an endovascular repair is not possible.   This information is not intended to replace advice given to you by your health care provider. Make sure you discuss any questions you have with your health care provider.   Document Released: 11/30/2004 Document Revised: 06/17/2012 Document Reviewed: 03/22/2012 Elsevier Interactive Patient Education 2016 Elsevier Inc.     Steps to Quit Smoking  Smoking tobacco can be harmful to your health and can affect almost every organ in your body. Smoking puts you, and those around you, at risk for developing many serious chronic diseases. Quitting smoking is difficult, but it is one of the best things that you can do for your health. It is never too late to quit. WHAT ARE THE BENEFITS OF QUITTING SMOKING? When you quit smoking, you lower your risk of developing serious diseases and conditions, such as:  Lung cancer or lung disease, such as COPD.  Heart disease.  Stroke.  Heart attack.  Infertility.  Osteoporosis and bone fractures. Additionally, symptoms such as coughing, wheezing, and shortness of breath may get better when you quit. You may also find that you get sick less often because your body is stronger at fighting off colds and infections. If you are pregnant, quitting smoking can help to reduce your chances of having a baby of low birth weight. HOW DO I GET READY TO QUIT? When you decide to quit smoking, create a plan to make sure that you are successful. Before you quit:  Pick a date to quit. Set a date within the next two weeks to give you time to prepare.  Write down the reasons why you are quitting. Keep this list in places where you will see it often, such as on your bathroom mirror or in your car or wallet.  Identify the people, places, things, and activities that make you want to smoke (triggers) and avoid them. Make sure to take  these actions:  Throw away all cigarettes at home, at work, and in your car.  Throw away smoking accessories, such as ashtrays and lighters.  Clean your car and make sure to empty the ashtray.  Clean your home, including curtains and carpets.  Tell your family, friends, and coworkers that you are quitting. Support from your loved ones can make quitting easier.  Talk with your health care provider about your options for quitting smoking.  Find out what treatment options are covered by your health insurance. WHAT STRATEGIES CAN I USE TO QUIT SMOKING?  Talk with your healthcare provider about different strategies to quit smoking. Some strategies include:  Quitting smoking altogether instead of gradually lessening how much you smoke over a period of time. Research shows that quitting "cold turkey" is more successful than gradually quitting.  Attending in-person counseling to help you build problem-solving skills. You are more likely to have success in quitting if you attend several counseling sessions. Even short sessions of 10 minutes can be effective.  Finding resources and support systems that can help you to quit smoking and remain smoke-free after you quit. These resources are most helpful when you use them often. They can include:  Online chats with a counselor.  Telephone   quitlines.  Printed self-help materials.  Support groups or group counseling.  Text messaging programs.  Mobile phone applications.  Taking medicines to help you quit smoking. (If you are pregnant or breastfeeding, talk with your health care provider first.) Some medicines contain nicotine and some do not. Both types of medicines help with cravings, but the medicines that include nicotine help to relieve withdrawal symptoms. Your health care provider may recommend:  Nicotine patches, gum, or lozenges.  Nicotine inhalers or sprays.  Non-nicotine medicine that is taken by mouth. Talk with your health care  provider about combining strategies, such as taking medicines while you are also receiving in-person counseling. Using these two strategies together makes you more likely to succeed in quitting than if you used either strategy on its own. If you are pregnant or breastfeeding, talk with your health care provider about finding counseling or other support strategies to quit smoking. Do not take medicine to help you quit smoking unless told to do so by your health care provider. WHAT THINGS CAN I DO TO MAKE IT EASIER TO QUIT? Quitting smoking might feel overwhelming at first, but there is a lot that you can do to make it easier. Take these important actions:  Reach out to your family and friends and ask that they support and encourage you during this time. Call telephone quitlines, reach out to support groups, or work with a counselor for support.  Ask people who smoke to avoid smoking around you.  Avoid places that trigger you to smoke, such as bars, parties, or smoke-break areas at work.  Spend time around people who do not smoke.  Lessen stress in your life, because stress can be a smoking trigger for some people. To lessen stress, try:  Exercising regularly.  Deep-breathing exercises.  Yoga.  Meditating.  Performing a body scan. This involves closing your eyes, scanning your body from head to toe, and noticing which parts of your body are particularly tense. Purposefully relax the muscles in those areas.  Download or purchase mobile phone or tablet apps (applications) that can help you stick to your quit plan by providing reminders, tips, and encouragement. There are many free apps, such as QuitGuide from the CDC (Centers for Disease Control and Prevention). You can find other support for quitting smoking (smoking cessation) through smokefree.gov and other websites. HOW WILL I FEEL WHEN I QUIT SMOKING? Within the first 24 hours of quitting smoking, you may start to feel some withdrawal  symptoms. These symptoms are usually most noticeable 2-3 days after quitting, but they usually do not last beyond 2-3 weeks. Changes or symptoms that you might experience include:  Mood swings.  Restlessness, anxiety, or irritation.  Difficulty concentrating.  Dizziness.  Strong cravings for sugary foods in addition to nicotine.  Mild weight gain.  Constipation.  Nausea.  Coughing or a sore throat.  Changes in how your medicines work in your body.  A depressed mood.  Difficulty sleeping (insomnia). After the first 2-3 weeks of quitting, you may start to notice more positive results, such as:  Improved sense of smell and taste.  Decreased coughing and sore throat.  Slower heart rate.  Lower blood pressure.  Clearer skin.  The ability to breathe more easily.  Fewer sick days. Quitting smoking is very challenging for most people. Do not get discouraged if you are not successful the first time. Some people need to make many attempts to quit before they achieve long-term success. Do your best to stick to   your quit plan, and talk with your health care provider if you have any questions or concerns.   This information is not intended to replace advice given to you by your health care provider. Make sure you discuss any questions you have with your health care provider.   Document Released: 02/14/2001 Document Revised: 07/07/2014 Document Reviewed: 07/07/2014 Elsevier Interactive Patient Education 2016 Elsevier Inc.  

## 2016-01-05 NOTE — Progress Notes (Signed)
[facility] [place]   CC: Follow up Abdominal Aortic Aneurysm  History of Present Illness  Jay Mullins is a 65 y.o. (06/09/1950) male patient whom Dr. Scot Dock has monitored for an AAA.  Pt had been followed with an abdominal aortic aneurysm in [facility] but recently moved to this area. A study on 10/14/2014 showed that his aneurysm was 5.0 cm in maximum diameter. The study was done without contrast. In a normal risk patient we would consider elective repair at 5.5 cm. Dr. Scot Dock discussed the importance of tobacco cessation at pt's previous visit. Dr. Scot Dock arranged for a follow up CT scan in 6 months with contrast.  He denies any history of abdominal pain or back pain. He does continue to smoke a pack per day or more of cigarettes. CT ANGIOGRAM ABDOMEN PELVIS April 2017: Dr. Scot Dock reviewed the images of his CAT scan at the April 2017 visit. His aneurysm measured 5.2 cm in maximum diameter. The right common iliac artery measured 2.3 cm in maximum diameter. The left common iliac artery measured 2.0 cm in maximum diameter.  His aneurysm has enlarged only 2 mm from August of 2016 to April of 2017. Pt understands we would not consider elective repair unless the AAA reached 5.5 cm in maximum diameter. Dr. Scot Dock ordered a follow up ultrasound and evaluation in 6 months. If the aneurysm enlarges any further we should consider elective repair. Based on his CAT scan it appears that he would be a candidate for an endovascular repair.   The patient denies claudication in legs with walking. The patient denies history of stroke or TIA symptoms.  Pt Diabetic: No Pt smoker: smoker  (1+ ppd, started at age 89 yrs, quit several times)  Past Medical History:  Diagnosis Date  . AAA (abdominal aortic aneurysm) ([place])    infrarenal AAA  . Aortic aneurysm ([place])   . Benign prostatic hyperplasia   . COPD (chronic obstructive pulmonary disease) ([place])   . Panlobular  emphysema ([place])   . Prostate disorder    Past Surgical History:  Procedure Laterality Date  . BRAIN SURGERY  Jul 14, 2013   Mass front Left side   Social History Social History   Social History  . Marital status: Married    Spouse name: N/A  . Number of children: N/A  . Years of education: N/A   Occupational History  . Not on file.   Social History Main Topics  . Smoking status: Current Every Day Smoker  . Smokeless tobacco: Never Used  . Alcohol use No  . Drug use: No  . Sexual activity: Not on file   Other Topics Concern  . Not on file   Social History Narrative  . No narrative on file   Family History Family History  Problem Relation Age of Onset  . COPD Sister     Current Outpatient Prescriptions on File Prior to Visit  Medication Sig Dispense Refill  . albuterol (PROAIR HFA) 108 (90 BASE) MCG/ACT inhaler Inhale 2 puffs into the lungs every 4 (four) hours as needed for wheezing or shortness of breath. Received from [facility]    . ALPRAZolam (XANAX) 1 MG tablet TAKE 1 TABLET BY MOUTH EVERY NIGHT AT BEDTIME AS NEEDED FOR SLEEP    . butalbital-acetaminophen-caffeine (FIORICET, ESGIC) 50-325-40 MG tablet TAKE 2 TABLETS BY MOUTH EVERY 4 HOURS AS NEEDED FOR PAIN OR HEADACHE    . Cholecalciferol (D 1000) 1000 UNITS capsule Take by mouth.    Marland Kitchen  colchicine 0.6 MG tablet TAKE 1 TABLET BY MOUTH THREE TIMES DAILY    . escitalopram (LEXAPRO) 20 MG tablet TAKE 1 TABLET BY MOUTH EVERY DAY    . ipratropium-albuterol (DUONEB) 0.5-2.5 (3) MG/3ML SOLN 3 mLs.    Marland Kitchen lisinopril (PRINIVIL,ZESTRIL) 10 MG tablet TAKE 1 TABLET BY MOUTH EVERY DAY    . pantoprazole (PROTONIX) 40 MG tablet Take 40 mg by mouth daily. Received from [facility]    . pravastatin (PRAVACHOL) 20 MG tablet Take 20 mg by mouth daily. Received from [facility]    . tamsulosin (FLOMAX) 0.4 MG CAPS capsule Take 0.4 mg by mouth. Received from [facility]    . Tiotropium Bromide Monohydrate 2.5 MCG/ACT AERS  Inhale 1 puff into the lungs daily. Received from [facility]    . vitamin B-12 (CYANOCOBALAMIN) 1000 MCG tablet Take 1,000 mcg by mouth daily. Received from [facility]    . vitamin C (ASCORBIC ACID) 250 MG tablet Take 250 mg by mouth daily. Received from [facility]    . budesonide-formoterol (SYMBICORT) 160-4.5 MCG/ACT inhaler Inhale 2 puffs into the lungs.    Marland Kitchen HYDROcodone-homatropine (HYCODAN) 5-1.5 MG/5ML syrup Reported on 06/23/2015     No current facility-administered medications on file prior to visit.    Allergies  Allergen Reactions  . Ativan [Lorazepam] Other (See Comments)    hallucinations    ROS: See HPI for pertinent positives and negatives.  Physical Examination  Vitals:   01/05/16 0949  BP: 138/86  Pulse: 68  Temp: 97.1 F (36.2 C)  TempSrc: Oral  SpO2: 92%  Weight: 141 lb 12.8 oz (64.3 kg)  Height: 5' 8.25" (1.734 m)   Body mass index is 21.4 kg/m.  General: A&O x 3, WD, cachectic appearing male.  Pulmonary: Sym exp, respirations are slightly labored, limited air movt, moist cough  Cardiac: RRR, Nl S1, S2, no detected murmur.   Carotid Bruits Right Left   Negative Negative   Aorta is moderately palpable Radial pulses are 2+ palpable and =                          VASCULAR EXAM:                                                                                                         LE Pulses Right Left       FEMORAL  3+ palpable  3+ palpable        POPLITEAL  1+ palpable   1+ palpable       POSTERIOR TIBIAL  2+ palpable   2+ palpable        DORSALIS PEDIS      ANTERIOR TIBIAL not palpable  not palpable      Gastrointestinal: soft, NTND, -G/R, - HSM, - masses palpated, - CVAT B.  Musculoskeletal: M/S 5/5 throughout, Extremities without ischemic changes.  Neurologic: CN 2-12 intact, Pain and light touch intact in extremities are intact, Motor exam as listed above.  Non-Invasive Vascular Imaging  AAA Duplex  (  01/05/2016)  Previous size: 5.2 cm  (CT) (Date: 06/22/15)  Current size:  5.42 cm (Date: 01/05/16) Right CIA: 2.17 cm; Left CIA: 1.96 cm. No significant stenosis of the abdominal aorta and bilateral common iliac arteries.   Medical Decision Making  The patient is a 65 y.o. male who presents with asymptomatic AAA with slight increase in size based on limited visualization due to overlying bowel gas.  The patient was counseled re smoking cessation and given several free resources re smoking cessation.    Based on this patient's exam and diagnostic studies, and after discussing with Dr. Scot Dock the patient will be scheduled in 6 months for CTA ab/pelvis with 1 mm cuts, follow up with Dr. Scot Dock for discussion of results  Consideration for repair of AAA would be made when the size is 5.5 cm, growth > 1 cm/yr, and symptomatic status.  I emphasized the importance of maximal medical management including strict control of blood pressure, blood glucose, and lipid levels, antiplatelet agents, obtaining regular exercise, and cessation of smoking.   The patient was given information about AAA including signs, symptoms, treatment, and how to minimize the risk of enlargement and rupture of aneurysms.    The patient was advised to call 911 should the patient experience sudden onset abdominal or back pain.   Thank you for allowing [place] to participate in this patient's care.  Clemon Chambers, RN, MSN, FNP-C [facility] [place] Office: [phone]  Clinic Physician: Scot Dock  01/05/2016, 10:28 AM

## 2016-01-21 NOTE — Addendum Note (Signed)
Addended by: Thresa Ross C on: 01/21/2016 10:39 AM   Modules accepted: Orders

## 2016-06-27 ENCOUNTER — Ambulatory Visit
Admission: RE | Admit: 2016-06-27 | Discharge: 2016-06-27 | Disposition: A | Discharge status: Home / Self Care | Payer: Medicare Other | Source: Ambulatory Visit | Attending: Vascular Surgery | Admitting: Vascular Surgery

## 2016-06-27 DIAGNOSIS — I714 Abdominal aortic aneurysm, without rupture, unspecified: Secondary | ICD-10-CM

## 2016-06-27 MED ORDER — IOPAMIDOL (ISOVUE-370) INJECTION 76%
75.0000 mL | Freq: Once | INTRAVENOUS | Status: AC | PRN
  Administered 2016-06-27: 75 mL via INTRAVENOUS

## 2016-06-28 ENCOUNTER — Encounter: Payer: Self-pay | Admitting: Vascular Surgery

## 2016-07-05 ENCOUNTER — Ambulatory Visit (INDEPENDENT_AMBULATORY_CARE_PROVIDER_SITE_OTHER): Payer: Medicare Other | Admitting: Vascular Surgery

## 2016-07-05 ENCOUNTER — Encounter: Payer: Self-pay | Admitting: Vascular Surgery

## 2016-07-05 VITALS — BP 116/79 | HR 81 | Temp 98.5°F | Resp 20 | Ht 68.0 in | Wt 139.0 lb

## 2016-07-05 DIAGNOSIS — I714 Abdominal aortic aneurysm, without rupture, unspecified: Secondary | ICD-10-CM

## 2016-07-05 DIAGNOSIS — Z01812 Encounter for preprocedural laboratory examination: Secondary | ICD-10-CM

## 2016-07-05 NOTE — Progress Notes (Signed)
Patient name: Jay Mullins MRN: [id number] DOB: Aug 09, 1950 Sex: male  REASON FOR VISIT:    Follow up of abdominal aortic aneurysm.  HPI:   Jay Mullins is a 66 y.o. male who I last saw a year ago. I have been following him with an abdominal aortic aneurysm. He was last seen by our nurse practitioner 6 months ago at that time the aneurysm was 5.4 cm in maximum diameter. He comes in for a 6 month follow up visit with a CT scan.  Since I saw him last, he denies any history of abdominal pain or back pain.  He continues to smoke a pack per day and has been smoking since he was 66 years old.  He denies any history of previous myocardial infarction or history of congestive heart failure.  He has had previous abdominal surgery. He tells me that he is had surgery on his stomach twice in the remote past.  Past Medical History:  Diagnosis Date  . AAA (abdominal aortic aneurysm) ([place])    infrarenal AAA  . Aortic aneurysm ([place])   . Benign prostatic hyperplasia   . COPD (chronic obstructive pulmonary disease) ([place])   . Panlobular emphysema ([place])   . Prostate disorder     Family History  Problem Relation Age of Onset  . COPD Sister     SOCIAL HISTORY: Social History  Substance Use Topics  . Smoking status: Current Every Day Smoker  . Smokeless tobacco: Never Used     Comment: 1 pk per day  . Alcohol use No    Allergies  Allergen Reactions  . Ativan [Lorazepam] Other (See Comments)    hallucinations    Current Outpatient Prescriptions  Medication Sig Dispense Refill  . albuterol (PROAIR HFA) 108 (90 BASE) MCG/ACT inhaler Inhale 2 puffs into the lungs every 4 (four) hours as needed for wheezing or shortness of breath. Received from [facility]    . ALPRAZolam (XANAX) 1 MG tablet TAKE 1 TABLET BY MOUTH EVERY NIGHT AT BEDTIME AS NEEDED FOR SLEEP    . butalbital-acetaminophen-caffeine (FIORICET, ESGIC) 50-325-40 MG tablet TAKE 2 TABLETS BY MOUTH EVERY 4 HOURS AS NEEDED FOR PAIN  OR HEADACHE    . Cholecalciferol (D 1000) 1000 UNITS capsule Take by mouth.    . colchicine 0.6 MG tablet TAKE 1 TABLET BY MOUTH THREE TIMES DAILY    . escitalopram (LEXAPRO) 20 MG tablet TAKE 1 TABLET BY MOUTH EVERY DAY    . finasteride (PROSCAR) 5 MG tablet Take 1 tablet by mouth daily.  3  . HYDROcodone-homatropine (HYCODAN) 5-1.5 MG/5ML syrup Reported on 06/23/2015    . ipratropium-albuterol (DUONEB) 0.5-2.5 (3) MG/3ML SOLN 3 mLs.    Marland Kitchen lisinopril (PRINIVIL,ZESTRIL) 10 MG tablet TAKE 1 TABLET BY MOUTH EVERY DAY    . pantoprazole (PROTONIX) 40 MG tablet Take 40 mg by mouth daily. Received from [facility]    . pravastatin (PRAVACHOL) 20 MG tablet Take 20 mg by mouth daily. Received from [facility]    . tamsulosin (FLOMAX) 0.4 MG CAPS capsule Take 0.4 mg by mouth. Received from [facility]    . Tiotropium Bromide Monohydrate 2.5 MCG/ACT AERS Inhale 1 puff into the lungs daily. Received from [facility]    . vitamin B-12 (CYANOCOBALAMIN) 1000 MCG tablet Take 1,000 mcg by mouth daily. Received from [facility]    . vitamin C (ASCORBIC ACID) 250 MG tablet Take 250 mg by mouth daily. Received from [facility]    . budesonide-formoterol (SYMBICORT)  160-4.5 MCG/ACT inhaler Inhale 2 puffs into the lungs.     No current facility-administered medications for this visit.     REVIEW OF SYSTEMS:  [X]  denotes positive finding, [ ]  denotes negative finding Cardiac  Comments:  Chest pain or chest pressure:    Shortness of breath upon exertion: X   Short of breath when lying flat:    Irregular heart rhythm: X       Vascular    Pain in calf, thigh, or hip brought on by ambulation:    Pain in feet at night that wakes you up from your sleep:     Blood clot in your veins:    Leg swelling:         Pulmonary    Oxygen at home: X   Productive cough:     Wheezing:         Neurologic    Sudden weakness in arms or legs:     Sudden numbness in arms or legs:     Sudden onset of difficulty  speaking or slurred speech:    Temporary loss of vision in one eye:     Problems with dizziness:         Gastrointestinal    Blood in stool:     Vomited blood:         Genitourinary    Burning when urinating:     Blood in urine:        Psychiatric    Major depression:         Hematologic    Bleeding problems:    Problems with blood clotting too easily:        Skin    Rashes or ulcers:        Constitutional    Fever or chills:     PHYSICAL EXAM:   Vitals:   07/05/16 1419  BP: 116/79  Pulse: 81  Resp: 20  Temp: 98.5 F (36.9 C)  TempSrc: Oral  SpO2: 91%  Weight: 139 lb (63 kg)  Height: 5\' 8"  (1.727 m)    GENERAL: The patient is a well-nourished male, in no acute distress. The vital signs are documented above. CARDIAC: There is a regular rate and rhythm.  VASCULAR: I do not detect carotid bruits. He has palpable femoral, popliteal, and posterior tibial pulses bilaterally. PULMONARY: There is good air exchange bilaterally without wheezing or rales. ABDOMEN: Soft and non-tender with normal pitched bowel sounds. His aneurysm is palpable and nontender. MUSCULOSKELETAL: There are no major deformities or cyanosis. NEUROLOGIC: No focal weakness or paresthesias are detected. SKIN: There are no ulcers or rashes noted. PSYCHIATRIC: The patient has a normal affect.  DATA:    CT ANGIOGRAM ABDOMEN PELVIS: I have reviewed his CT angiogram which shows that the aneurysm has enlarged to 5.6 cm. 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  An incidental finding on CT scan is a 1.8 centimeter parenchymal density at the right lung base which may simply represent some atelectasis. However a follow up CT scan in 3 months was recommended. I discussed these results with the patient.  MEDICAL ISSUES:   5.5 CM INFRARENAL ABDOMINAL AORTIC ANEURYSM: Given that the aneurysm has enlarged to 5.5 cm, I think we should  consider elective repair. He has significant COPD and emphysema and therefore an endovascular approach would be ideal. In addition he had 2 previous operations on his stomach which  would further complicate open repair. Regardless, based on his CT scan, I think he is a reasonable candidate for an endovascular repair. I will arrange for preoperative cardiac clearance. His wife is followed by Dr. Dorris Carnes and therefore we will schedule an appointment with Dr. Harrington Challenger for preoperative evaluation.  I have discussed the indications for aneurysm repair. I have explained that without repair, the risk of the aneurysm rupturing is 10% per year. We have discussed the advantages and disadvantages of open versus endovascular repair. The patient wishes to proceed with endovascular aneurysm repair (EVAR). I have discussed the potential complications of EVAR, including, but not limited to: bleeding, infection, arterial injury, graft migration, endoleak, renal failure, MI or other unpredictable medical problems. We have discussed the possibility of having to convert to open repair. We also discussed the need for continued lifelong follow-up after EVAR. All of the patients questions were answered and they are agreeable to proceed with surgery once his cardiac workup is complete.  Deitra Mayo [facility] [place] [phone]

## 2016-07-06 ENCOUNTER — Other Ambulatory Visit: Payer: Self-pay

## 2016-07-13 ENCOUNTER — Encounter: Payer: Self-pay | Admitting: Physician Assistant

## 2016-07-14 NOTE — Progress Notes (Signed)
Cardiology Office Note    Date:  07/17/2016   ID:  Jay Mullins, DOB Feb 02, 1951, MRN [id number]  PCP:  Chesley Noon, MD  Cardiologist:  New (wife follows with Dr. Harrington Challenger)   CC: pre op clearance for AAA repair   History of Present Illness:  Jay Mullins is a 66 y.o. male with a history of long term tobacco abuse, HTN, HLD, COPD, BPH and AAA and bilateral common iliac aneurysms   She has been followed by Dr. Scot Dock with [facility]. Recent CT angiogram showed CT showed that the abdominal aneurysm has enlarged to 5.6 cm. 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  An incidental finding on CT scan is a 1.8 centimeter parenchymal density at the right lung base which may simply represent some atelectasis. However a follow up CT scan in 3 months was recommended.  Dr. Scot Dock felt that elective repair should be considered given recent growth from 5.2 (6 months ago). Plan will be endovascular repair given COPD. He was referred to [place] for pre op clearance.  Today he presents to clinic for new patient evaluation. He said he had a mild heart attack about 30 years ago where he had a mild heart attack that was opened up with a balloon. Last week he was out walking and came back to the house. His wife checked his oxygen at home and his HR 175. She rechecked it with the BP machine which verified this reading. He felt totally fine during this episode. His wife gave him a Xanax and laid down and HR went back down to normal. He does not ever get chest pain. He had chronic dyspnea on exertion that he attributes to COPD. 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 He likes to watch [employer] and follows Trump. He isn't as active as he used to be. He deos    Past Medical History:  Diagnosis Date  . AAA (abdominal aortic aneurysm) ([place])    infrarenal AAA  . Aortic aneurysm ([place])   . Benign prostatic hyperplasia   . COPD (chronic  obstructive pulmonary disease) ([place])   . Panlobular emphysema ([place])   . Prostate disorder     Past Surgical History:  Procedure Laterality Date  . BRAIN SURGERY  Jul 14, 2013   Mass front Left side    Current Medications: Outpatient Medications Prior to Visit  Medication Sig Dispense Refill  . albuterol (PROAIR HFA) 108 (90 BASE) MCG/ACT inhaler Inhale 2 puffs into the lungs every 4 (four) hours as needed for wheezing or shortness of breath. Received from [facility]    . ALPRAZolam (XANAX) 1 MG tablet TAKE 1 TABLET BY MOUTH EVERY NIGHT AT BEDTIME AS NEEDED FOR SLEEP    . budesonide-formoterol (SYMBICORT) 160-4.5 MCG/ACT inhaler Inhale 2 puffs into the lungs.    . butalbital-acetaminophen-caffeine (FIORICET, ESGIC) 50-325-40 MG tablet TAKE 2 TABLETS BY MOUTH EVERY 4 HOURS AS NEEDED FOR PAIN OR HEADACHE    . Cholecalciferol (D 1000) 1000 UNITS capsule Take by mouth.    . colchicine 0.6 MG tablet TAKE 1 TABLET BY MOUTH THREE TIMES DAILY    . escitalopram (LEXAPRO) 20 MG tablet TAKE 1 TABLET BY MOUTH EVERY DAY    . finasteride (PROSCAR) 5 MG tablet Take 1 tablet by mouth daily.  3  . ipratropium-albuterol (DUONEB) 0.5-2.5 (3) MG/3ML SOLN 3 mLs.    Marland Kitchen lisinopril (PRINIVIL,ZESTRIL)  10 MG tablet TAKE 1 TABLET BY MOUTH EVERY DAY    . pantoprazole (PROTONIX) 40 MG tablet Take 40 mg by mouth daily. Received from [facility]    . pravastatin (PRAVACHOL) 20 MG tablet Take 20 mg by mouth daily. Received from [facility]    . tamsulosin (FLOMAX) 0.4 MG CAPS capsule Take 0.4 mg by mouth. Received from [facility]    . Tiotropium Bromide Monohydrate 2.5 MCG/ACT AERS Inhale 1 puff into the lungs daily. Received from [facility]    . vitamin B-12 (CYANOCOBALAMIN) 1000 MCG tablet Take 1,000 mcg by mouth daily. Received from [facility]    . vitamin C (ASCORBIC ACID) 250 MG tablet Take 250 mg by mouth daily. Received from [facility]    . HYDROcodone-homatropine (HYCODAN) 5-1.5 MG/5ML syrup  Reported on 06/23/2015     No facility-administered medications prior to visit.      Allergies:   Ativan [lorazepam]   Social History   Social History  . Marital status: Married    Spouse name: N/A  . Number of children: N/A  . Years of education: N/A   Social History Main Topics  . Smoking status: Current Every Day Smoker  . Smokeless tobacco: Never Used     Comment: 1 pk per day  . Alcohol use No  . Drug use: No  . Sexual activity: Not Asked   Other Topics Concern  . None   Social History Narrative  . None     Family History:  The patient's family history includes COPD in his sister.      ROS:   Please see the history of present illness.    ROS All other systems reviewed and are negative.   PHYSICAL EXAM:   VS:  BP 122/74   Pulse 60   Ht 5\' 9"  (1.753 m)   Wt 138 lb 1.9 oz (62.7 kg)   BMI 20.40 kg/m    GEN: Well nourished, well developed, in no acute distress  HEENT: normal  Neck: no JVD, carotid bruits, or masses Cardiac: RRR; no murmurs, rubs, or gallops,no edema  Respiratory:  clear to auscultation bilaterally, normal work of breathing GI: soft, nontender, nondistended, + BS MS: no deformity or atrophy  Skin: warm and dry, no rash Neuro:  Alert and Oriented x 3, Strength and sensation are intact Psych: euthymic mood, full affect    Wt Readings from Last 3 Encounters:  07/17/16 138 lb 1.9 oz (62.7 kg)  07/05/16 139 lb (63 kg)  01/05/16 141 lb 12.8 oz (64.3 kg)      Studies/Labs Reviewed:   EKG:  EKG is ordered today.  The ekg ordered today demonstrates NSR  HR 84.   Recent Labs: No results found for requested labs within last 8760 hours.   Lipid Panel No results found for: CHOL, TRIG, HDL, CHOLHDL, VLDL, LDLCALC, LDLDIRECT  Additional studies/ records that were reviewed today include:  CT angio abdomen pelvis 06/27/16 IMPRESSION: VASCULAR Abdominal aortic aneurysm has increased in diameter from 5.2 cm to 5.6 cm.  Stable right and  left common iliac artery aneurysm at 2.3 and 2.0 cm respectively.  NON-VASCULAR  1.8 cm nodular parenchymal density at the right lung base may represent round atelectasis. Follow-up CT in 3 months is recommended. True pulmonary nodule is not excluded. Initial follow-up by chest CT without contrast is recommended in 3 months to confirm persistence. This recommendation follows the consensus statement: Recommendations for the Management of Subsolid Pulmonary Nodules Detected at  CT: A Statement from the [place] as published in Radiology 2013; 266:304-317.  Chronic changes as described. Stability of these findings supports benign etiology.    ASSESSMENT & PLAN:   AAA: enlarging from 5.2--> 5.6 in past 6 months. Plan is for elective EVAR by Dr. Scot Dock. He may have a history of CAD (reports remote "small heart attack" in his early 49s at [facility]). He has RFs for CAD including heavy tobacco abuse, HTN and HLD. He denies chest pain but does have chronic DOE that he attributes to COPD. Will get a nuclear stress test for pre op risk stratification.   COPD: he wears 02 at night.   HTN: BP well controlled currently   HLD: continue statin   Tobacco abuse: with ongoing tobacco abuse and no intentions to quit  Tachycardia: had an episode of HR in 175s by pulse oximeter and BP cuff. Will get a 30 day monitor.   Weight loss: his wife is concerned with him loosing weight. His weight 141--> 138 lbs since last November. I have asked them to continue to monitor and follow up with PCP.   Medication Adjustments/Labs and Tests Ordered: Current medicines are reviewed at length with the patient today.  Concerns regarding medicines are outlined above.  Medication changes, Labs and Tests ordered today are listed in the Patient Instructions below. Patient Instructions  Medication Instructions:  Your physician recommends that you continue on your current medications as directed. Please refer to  the Current Medication list given to you today.   Labwork: None ordered  Testing/Procedures: Your physician has requested that you have a [place].  For further information please visit [email or web address]. Please follow instruction sheet, as given.  Your physician has recommended that you wear an event monitor AFTER 07/27/16 SURGERY.  Event monitors are medical devices that record the heart's electrical activity. Doctors most often [place] these monitors to diagnose arrhythmias. Arrhythmias are problems with the speed or rhythm of the heartbeat. The monitor is a small, portable device. You can wear one while you do your normal daily activities. This is usually used to diagnose what is causing palpitations/syncope (passing out).    Follow-Up: Your physician recommends that you schedule a follow-up appointment in: KATIE Carlicia Leavens, PA-C OR DR. ROSS IN 4 MONTHS    Any Other Special Instructions Will Be Listed Below (If Applicable).  Pharmacologic Stress Electrocardiogram Introduction A pharmacologic stress electrocardiogram is a heart (cardiac) test that uses nuclear imaging to evaluate the blood supply to your heart. This test may also be called a pharmacologic stress electrocardiography. Pharmacologic means that a medicine is used to increase your heart rate and blood pressure. This stress test is done to find areas of poor blood flow to the heart by determining the extent of coronary artery disease (CAD). Some people exercise on a treadmill, which naturally increases the blood flow to the heart. For those people unable to exercise on a treadmill, a medicine is used. This medicine stimulates your heart and will cause your heart to beat harder and more quickly, as if you were exercising. Pharmacologic stress tests can help determine:  The adequacy of blood flow to your heart during increased levels of activity in order to clear you for discharge home.  The extent  of coronary artery blockage caused by CAD.  Your prognosis if you have suffered a heart attack.  The effectiveness of cardiac procedures done, such as an angioplasty, which can increase the circulation in your coronary arteries.  Causes of chest pain or pressure. LET [facility] CARE PROVIDER KNOW ABOUT:  Any allergies you have.  All medicines you are taking, including vitamins, herbs, eye drops, creams, and over-the-counter medicines.  Previous problems you or members of your family have had with the use of anesthetics.  Any blood disorders you have.  Previous surgeries you have had.  Medical conditions you have.  Possibility of pregnancy, if this applies.  If you are currently breastfeeding. RISKS AND COMPLICATIONS Generally, this is a safe procedure. However, as with any procedure, complications can occur. Possible complications include:  You develop pain or pressure in the following areas:  Chest.  Jaw or neck.  Between your shoulder blades.  Radiating down your left arm.  Headache.  Dizziness or light-headedness.  Shortness of breath.  Increased or irregular heartbeat.  Low blood pressure.  Nausea or vomiting.  Flushing.  Redness going up the arm and slight pain during injection of medicine.  Heart attack (rare). BEFORE THE PROCEDURE  Avoid all forms of caffeine for 24 hours before your test or as directed by your health care provider. This includes coffee, tea (even decaffeinated tea), caffeinated sodas, chocolate, cocoa, and certain pain medicines.  Follow your health care provider's instructions regarding eating and drinking before the test.  Take your medicines as directed at regular times with water unless instructed otherwise. Exceptions may include:  If you have diabetes, ask how you are to take your insulin or pills. It is common to adjust insulin dosing the morning of the test.  If you are taking beta-blocker medicines, it is important to  talk to your health care provider about these medicines well before the date of your test. Taking beta-blocker medicines may interfere with the test. In some cases, these medicines need to be changed or stopped 24 hours or more before the test.  If you wear a nitroglycerin patch, it may need to be removed prior to the test. Ask your health care provider if the patch should be removed before the test.  If you use an inhaler for any breathing condition, bring it with you to the test.  If you are an outpatient, bring a snack so you can eat right after the stress phase of the test.  Do not smoke for 4 hours prior to the test or as directed by your health care provider.  Do not apply lotions, powders, creams, or oils on your chest prior to the test.  Wear comfortable shoes and clothing. Let your health care provider know if you were unable to complete or follow the preparations for your test. PROCEDURE  Multiple patches (electrodes) will be put on your chest. If needed, small areas of your chest may be shaved to get better contact with the electrodes. Once the electrodes are attached to your body, multiple wires will be attached to the electrodes, and your heart rate will be monitored.  An IV access will be started. A nuclear trace (isotope) is given. The isotope may be given intravenously, or it may be swallowed. Nuclear refers to several types of radioactive isotopes, and the nuclear isotope lights up the arteries so that the nuclear images are clear. The isotope is absorbed by your body. This results in low radiation exposure.  A resting nuclear image is taken to show how your heart functions at rest.  A medicine is given through the IV access.  A second scan is done about 1 hour after the medicine injection and determines how your heart  functions under stress.  During this stress phase, you will be connected to an electrocardiogram machine. Your blood pressure and oxygen levels will be  monitored. What to expect after the procedure  Your heart rate and blood pressure will be monitored after the test.  You may return to your normal schedule, including diet,activities, and medicines, unless your health care provider tells you otherwise. This information is not intended to replace advice given to you by your health care provider. Make sure you discuss any questions you have with your health care provider. Document Released: 07/09/2008 Document Revised: 07/29/2015 Document Reviewed: 08/30/2015 Elsevier Interactive Patient Education  2017 [place].    Cardiac Event Monitoring A cardiac event monitor is a small recording device that is used to detect abnormal heart rhythms (arrhythmias). The monitor is used to record your heart rhythm when you have symptoms, such as:  Fast heartbeats (palpitations), such as heart racing or fluttering.  Dizziness.  Fainting or light-headedness.  Unexplained weakness. Some monitors are wired to electrodes placed on your chest. Electrodes are flat, sticky disks that attach to your skin. Other monitors may be hand-held or worn on the wrist. The monitor can be worn for up to 30 days. If the monitor is attached to your chest, a technician will prepare your chest for the electrode placement and show you how to work the monitor. Take time to practice using the monitor before you leave the office. Make sure you understand how to send the information from the monitor to your health care provider. In some cases, you may need to use a landline telephone instead of a cell phone. What are the risks? Generally, this device is safe to use, but it possible that the skin under the electrodes will become irritated. How to use your cardiac event monitor  Wear your monitor at all times, except when you are in water:  Do not let the monitor get wet.  Take the monitor off when you bathe. Do not swim or use a hot tub with it on.  Keep your skin clean. Do  not put body lotion or moisturizer on your chest.  Change the electrodes as told by your health care provider or any time they stop sticking to your skin. You may need to use medical tape to keep them on.  Try to put the electrodes in slightly different places on your chest to help prevent skin irritation. They must remain in the area under your left breast and in the upper right section of your chest.  Make sure the monitor is safely clipped to your clothing or in a location close to your body that your health care provider recommends.  Press the button to record as soon as you feel heart-related symptoms, such as:  Dizziness.  Weakness.  Light-headedness.  Palpitations.  Thumping or pounding in your chest.  Shortness of breath.  Unexplained weakness.  Keep a diary of your activities, such as walking, doing chores, and taking medicine. It is very important to note what you were doing when you pushed the button to record your symptoms. This will help your health care provider determine what might be contributing to your symptoms.  Send the recorded information as recommended by your health care provider. It may take some time for your health care provider to process the results.  Change the batteries as told by your health care provider.  Keep electronic devices away from your monitor. This includes:  Tablets.  MP3 players.  Cell phones.  While wearing your monitor you should avoid:  Electric blankets.  Armed forces operational officer.  Electric toothbrushes.  Microwave ovens.  Magnets.  Metal detectors. Get help right away if:  You have chest pain.  You have extreme difficulty breathing or shortness of breath.  You develop a very fast heartbeat that persists.  You develop dizziness that does not go away.  You faint or constantly feel like you are about to faint. Summary  A cardiac event monitor is a small recording device that is used to help detect abnormal heart  rhythms (arrhythmias).  The monitor is used to record your heart rhythm when you have heart-related symptoms.  Make sure you understand how to send the information from the monitor to your health care provider.  It is important to press the button on the monitor when you have any heart-related symptoms.  Keep a diary of your activities, such as walking, doing chores, and taking medicine. It is very important to note what you were doing when you pushed the button to record your symptoms. This will help your health care provider learn what might be causing your symptoms. This information is not intended to replace advice given to you by your health care provider. Make sure you discuss any questions you have with your health care provider. Document Released: 11/30/2007 Document Revised: 02/05/2016 Document Reviewed: 02/05/2016 Elsevier Interactive Patient Education  2017 [employer].  If you need a refill on your cardiac medications before your next appointment, please call your pharmacy.      Signed, Angelena Form, PA-C  07/17/2016 10:10 AM    [facility] [place], [place], [place]  [address] Phone: [phone]; Fax: [phone]

## 2016-07-17 ENCOUNTER — Encounter: Payer: Self-pay | Admitting: Physician Assistant

## 2016-07-17 ENCOUNTER — Telehealth (HOSPITAL_COMMUNITY): Payer: Self-pay | Admitting: *Deleted

## 2016-07-17 ENCOUNTER — Ambulatory Visit (INDEPENDENT_AMBULATORY_CARE_PROVIDER_SITE_OTHER): Payer: Medicare Other | Admitting: Physician Assistant

## 2016-07-17 VITALS — BP 122/74 | HR 60 | Ht 69.0 in | Wt 138.1 lb

## 2016-07-17 DIAGNOSIS — Z01818 Encounter for other preprocedural examination: Secondary | ICD-10-CM

## 2016-07-17 DIAGNOSIS — Z720 Tobacco use: Secondary | ICD-10-CM

## 2016-07-17 DIAGNOSIS — R000 Tachycardia, unspecified: Secondary | ICD-10-CM | POA: Diagnosis not present

## 2016-07-17 DIAGNOSIS — I714 Abdominal aortic aneurysm, without rupture, unspecified: Secondary | ICD-10-CM

## 2016-07-17 DIAGNOSIS — J449 Chronic obstructive pulmonary disease, unspecified: Secondary | ICD-10-CM

## 2016-07-17 NOTE — Patient Instructions (Addendum)
Medication Instructions:  Your physician recommends that you continue on your current medications as directed. Please refer to the Current Medication list given to you today.   Labwork: None ordered  Testing/Procedures: Your physician has requested that you have a [place].  For further information please visit [email or web address]. Please follow instruction sheet, as given.  Your physician has recommended that you wear an event monitor AFTER 07/27/16 SURGERY.  Event monitors are medical devices that record the heart's electrical activity. Doctors most often [place] these monitors to diagnose arrhythmias. Arrhythmias are problems with the speed or rhythm of the heartbeat. The monitor is a small, portable device. You can wear one while you do your normal daily activities. This is usually used to diagnose what is causing palpitations/syncope (passing out).    Follow-Up: Your physician recommends that you schedule a follow-up appointment in: KATIE THOMPSON, PA-C OR DR. ROSS IN 4 MONTHS    Any Other Special Instructions Will Be Listed Below (If Applicable).  Pharmacologic Stress Electrocardiogram Introduction A pharmacologic stress electrocardiogram is a heart (cardiac) test that uses nuclear imaging to evaluate the blood supply to your heart. This test may also be called a pharmacologic stress electrocardiography. Pharmacologic means that a medicine is used to increase your heart rate and blood pressure. This stress test is done to find areas of poor blood flow to the heart by determining the extent of coronary artery disease (CAD). Some people exercise on a treadmill, which naturally increases the blood flow to the heart. For those people unable to exercise on a treadmill, a medicine is used. This medicine stimulates your heart and will cause your heart to beat harder and more quickly, as if you were exercising. Pharmacologic stress tests can help determine:  The  adequacy of blood flow to your heart during increased levels of activity in order to clear you for discharge home.  The extent of coronary artery blockage caused by CAD.  Your prognosis if you have suffered a heart attack.  The effectiveness of cardiac procedures done, such as an angioplasty, which can increase the circulation in your coronary arteries.  Causes of chest pain or pressure. LET [facility] CARE PROVIDER KNOW ABOUT:  Any allergies you have.  All medicines you are taking, including vitamins, herbs, eye drops, creams, and over-the-counter medicines.  Previous problems you or members of your family have had with the use of anesthetics.  Any blood disorders you have.  Previous surgeries you have had.  Medical conditions you have.  Possibility of pregnancy, if this applies.  If you are currently breastfeeding. RISKS AND COMPLICATIONS Generally, this is a safe procedure. However, as with any procedure, complications can occur. Possible complications include:  You develop pain or pressure in the following areas:  Chest.  Jaw or neck.  Between your shoulder blades.  Radiating down your left arm.  Headache.  Dizziness or light-headedness.  Shortness of breath.  Increased or irregular heartbeat.  Low blood pressure.  Nausea or vomiting.  Flushing.  Redness going up the arm and slight pain during injection of medicine.  Heart attack (rare). BEFORE THE PROCEDURE  Avoid all forms of caffeine for 24 hours before your test or as directed by your health care provider. This includes coffee, tea (even decaffeinated tea), caffeinated sodas, chocolate, cocoa, and certain pain medicines.  Follow your health care provider's instructions regarding eating and drinking before the test.  Take your medicines as directed at regular times with water unless instructed otherwise.  Exceptions may include:  If you have diabetes, ask how you are to take your insulin or  pills. It is common to adjust insulin dosing the morning of the test.  If you are taking beta-blocker medicines, it is important to talk to your health care provider about these medicines well before the date of your test. Taking beta-blocker medicines may interfere with the test. In some cases, these medicines need to be changed or stopped 24 hours or more before the test.  If you wear a nitroglycerin patch, it may need to be removed prior to the test. Ask your health care provider if the patch should be removed before the test.  If you use an inhaler for any breathing condition, bring it with you to the test.  If you are an outpatient, bring a snack so you can eat right after the stress phase of the test.  Do not smoke for 4 hours prior to the test or as directed by your health care provider.  Do not apply lotions, powders, creams, or oils on your chest prior to the test.  Wear comfortable shoes and clothing. Let your health care provider know if you were unable to complete or follow the preparations for your test. PROCEDURE  Multiple patches (electrodes) will be put on your chest. If needed, small areas of your chest may be shaved to get better contact with the electrodes. Once the electrodes are attached to your body, multiple wires will be attached to the electrodes, and your heart rate will be monitored.  An IV access will be started. A nuclear trace (isotope) is given. The isotope may be given intravenously, or it may be swallowed. Nuclear refers to several types of radioactive isotopes, and the nuclear isotope lights up the arteries so that the nuclear images are clear. The isotope is absorbed by your body. This results in low radiation exposure.  A resting nuclear image is taken to show how your heart functions at rest.  A medicine is given through the IV access.  A second scan is done about 1 hour after the medicine injection and determines how your heart functions under  stress.  During this stress phase, you will be connected to an electrocardiogram machine. Your blood pressure and oxygen levels will be monitored. What to expect after the procedure  Your heart rate and blood pressure will be monitored after the test.  You may return to your normal schedule, including diet,activities, and medicines, unless your health care provider tells you otherwise. This information is not intended to replace advice given to you by your health care provider. Make sure you discuss any questions you have with your health care provider. Document Released: 07/09/2008 Document Revised: 07/29/2015 Document Reviewed: 08/30/2015 Elsevier Interactive Patient Education  2017 [employer].    Cardiac Event Monitoring A cardiac event monitor is a small recording device that is used to detect abnormal heart rhythms (arrhythmias). The monitor is used to record your heart rhythm when you have symptoms, such as:  Fast heartbeats (palpitations), such as heart racing or fluttering.  Dizziness.  Fainting or light-headedness.  Unexplained weakness. Some monitors are wired to electrodes placed on your chest. Electrodes are flat, sticky disks that attach to your skin. Other monitors may be hand-held or worn on the wrist. The monitor can be worn for up to 30 days. If the monitor is attached to your chest, a technician will prepare your chest for the electrode placement and show you how to work the  monitor. Take time to practice using the monitor before you leave the office. Make sure you understand how to send the information from the monitor to your health care provider. In some cases, you may need to use a landline telephone instead of a cell phone. What are the risks? Generally, this device is safe to use, but it possible that the skin under the electrodes will become irritated. How to use your cardiac event monitor  Wear your monitor at all times, except when you are in water:  Do  not let the monitor get wet.  Take the monitor off when you bathe. Do not swim or use a hot tub with it on.  Keep your skin clean. Do not put body lotion or moisturizer on your chest.  Change the electrodes as told by your health care provider or any time they stop sticking to your skin. You may need to use medical tape to keep them on.  Try to put the electrodes in slightly different places on your chest to help prevent skin irritation. They must remain in the area under your left breast and in the upper right section of your chest.  Make sure the monitor is safely clipped to your clothing or in a location close to your body that your health care provider recommends.  Press the button to record as soon as you feel heart-related symptoms, such as:  Dizziness.  Weakness.  Light-headedness.  Palpitations.  Thumping or pounding in your chest.  Shortness of breath.  Unexplained weakness.  Keep a diary of your activities, such as walking, doing chores, and taking medicine. It is very important to note what you were doing when you pushed the button to record your symptoms. This will help your health care provider determine what might be contributing to your symptoms.  Send the recorded information as recommended by your health care provider. It may take some time for your health care provider to process the results.  Change the batteries as told by your health care provider.  Keep electronic devices away from your monitor. This includes:  Tablets.  MP3 players.  Cell phones.  While wearing your monitor you should avoid:  Electric blankets.  Armed forces operational officer.  Electric toothbrushes.  Microwave ovens.  Magnets.  Metal detectors. Get help right away if:  You have chest pain.  You have extreme difficulty breathing or shortness of breath.  You develop a very fast heartbeat that persists.  You develop dizziness that does not go away.  You faint or constantly feel  like you are about to faint. Summary  A cardiac event monitor is a small recording device that is used to help detect abnormal heart rhythms (arrhythmias).  The monitor is used to record your heart rhythm when you have heart-related symptoms.  Make sure you understand how to send the information from the monitor to your health care provider.  It is important to press the button on the monitor when you have any heart-related symptoms.  Keep a diary of your activities, such as walking, doing chores, and taking medicine. It is very important to note what you were doing when you pushed the button to record your symptoms. This will help your health care provider learn what might be causing your symptoms. This information is not intended to replace advice given to you by your health care provider. Make sure you discuss any questions you have with your health care provider. Document Released: 11/30/2007 Document Revised: 02/05/2016 Document Reviewed: 02/05/2016 Elsevier  Interactive Patient Education  2017 [employer].  If you need a refill on your cardiac medications before your next appointment, please call your pharmacy.

## 2016-07-17 NOTE — Telephone Encounter (Signed)
Left message on voicemail in reference to upcoming appointment scheduled for 07/20/16. Phone number given for a call back so details instructions can be given. Phillips Goulette, Ranae Palms

## 2016-07-18 ENCOUNTER — Telehealth (HOSPITAL_COMMUNITY): Payer: Self-pay | Admitting: *Deleted

## 2016-07-18 NOTE — Telephone Encounter (Signed)
Patient given detailed instructions per Myocardial Perfusion Study Information Sheet for the test on 07/20/16 at 0945. Patient notified to arrive 15 minutes early and that it is imperative to arrive on time for appointment to keep from having the test rescheduled.  If you need to cancel or reschedule your appointment, please call the office within 24 hours of your appointment. Failure to do so may result in a cancellation of your appointment, and a $50 no show fee. Patient verbalized understanding.Syesha Thaw, Ranae Palms

## 2016-07-18 NOTE — Pre-Procedure Instructions (Signed)
Jay Mullins  07/18/2016      [facility] - [place], [place] - [address] [place] [address] [place] [address] [place] [address] [place] [address] Phone: [phone] Fax: [phone]    Your procedure is scheduled on Thurs. May 24  Report to [facility] Admitting at 8:00 A.M.  Call this number if you have problems the morning of surgery:  [phone]   Remember:  Do not eat food or drink liquids after midnight on Wed. May 23   Take these medicines the morning of surgery with A SIP OF WATER : albuterol inhaler if needed-bring to hospital, symbocort-bring to hospital, fioricet if needed, colchicine if needed, lexapro, finasteride (proscar), duoneb, protonix (pantoprazole), tamsulosin (flomax),             1 week prior to surgery stop: aspirin, advil, motrin, aleve, ibuprofen, BC Powders, goody's, vitamins/herbal medciines.   Do not wear jewelry, make-up or nail polish.  Do not wear lotions, powders, or perfumes, or deoderant.  Do not shave 48 hours prior to surgery.  Men may shave face and neck.  Do not bring valuables to the hospital.  [facility] is not responsible for any belongings or valuables.  Contacts, dentures or bridgework may not be worn into surgery.  Leave your suitcase in the car.  After surgery it may be brought to your room.  For patients admitted to the hospital, discharge time will be determined by your treatment team.  Patients discharged the day of surgery will not be allowed to drive home.    Special instructions:   [place]- Preparing For Surgery  Before surgery, you can play an important role. Because skin is not sterile, your skin needs to be as free of germs as possible. You can reduce the number of germs on your skin by washing with CHG (chlorahexidine gluconate) Soap before surgery.  CHG is an antiseptic cleaner which kills germs and bonds with the skin to continue killing germs even after washing.  Please  do not use if you have an allergy to CHG or antibacterial soaps. If your skin becomes reddened/irritated stop using the CHG.  Do not shave (including legs and underarms) for at least 48 hours prior to first CHG shower. It is OK to shave your face.  Please follow these instructions carefully.   1. Shower the NIGHT BEFORE SURGERY and the MORNING OF SURGERY with CHG.   2. If you chose to wash your hair, wash your hair first as usual with your normal shampoo.  3. After you shampoo, rinse your hair and body thoroughly to remove the shampoo.  4. Use CHG as you would any other liquid soap. You can apply CHG directly to the skin and wash gently with a scrungie or a clean washcloth.   5. Apply the CHG Soap to your body ONLY FROM THE NECK DOWN.  Do not use on open wounds or open sores. Avoid contact with your eyes, ears, mouth and genitals (private parts). Wash genitals (private parts) with your normal soap.  6. Wash thoroughly, paying special attention to the area where your surgery will be performed.  7. Thoroughly rinse your body with warm water from the neck down.  8. DO NOT shower/wash with your normal soap after using and rinsing off the CHG Soap.  9. Pat yourself dry with a CLEAN TOWEL.   10. Wear CLEAN PAJAMAS   11. Place CLEAN SHEETS on  your bed the night of your first shower and DO NOT SLEEP WITH PETS.    Day of Surgery: Do not apply any deodorants/lotions. Please wear clean clothes to the hospital/surgery center.      Please read over the following fact sheets that you were given. Coughing and Deep Breathing, MRSA Information and Surgical Site Infection Prevention

## 2016-07-19 ENCOUNTER — Encounter (HOSPITAL_COMMUNITY): Payer: Self-pay

## 2016-07-19 ENCOUNTER — Other Ambulatory Visit: Payer: Self-pay

## 2016-07-19 ENCOUNTER — Encounter (HOSPITAL_COMMUNITY)
Admission: RE | Admit: 2016-07-19 | Discharge: 2016-07-19 | Disposition: A | Discharge status: Home / Self Care | Payer: Medicare Other | Source: Ambulatory Visit | Attending: Vascular Surgery | Admitting: Vascular Surgery

## 2016-07-19 DIAGNOSIS — I10 Essential (primary) hypertension: Secondary | ICD-10-CM | POA: Diagnosis not present

## 2016-07-19 DIAGNOSIS — I252 Old myocardial infarction: Secondary | ICD-10-CM | POA: Insufficient documentation

## 2016-07-19 DIAGNOSIS — Z79899 Other long term (current) drug therapy: Secondary | ICD-10-CM | POA: Insufficient documentation

## 2016-07-19 DIAGNOSIS — Z01818 Encounter for other preprocedural examination: Secondary | ICD-10-CM | POA: Insufficient documentation

## 2016-07-19 DIAGNOSIS — I714 Abdominal aortic aneurysm, without rupture: Secondary | ICD-10-CM | POA: Insufficient documentation

## 2016-07-19 DIAGNOSIS — Z7951 Long term (current) use of inhaled steroids: Secondary | ICD-10-CM | POA: Diagnosis not present

## 2016-07-19 DIAGNOSIS — Z01812 Encounter for preprocedural laboratory examination: Secondary | ICD-10-CM | POA: Diagnosis not present

## 2016-07-19 DIAGNOSIS — Z0183 Encounter for blood typing: Secondary | ICD-10-CM | POA: Insufficient documentation

## 2016-07-19 DIAGNOSIS — J439 Emphysema, unspecified: Secondary | ICD-10-CM | POA: Diagnosis not present

## 2016-07-19 HISTORY — DX: Acute myocardial infarction, unspecified: I21.9

## 2016-07-19 HISTORY — DX: Essential (primary) hypertension: I10

## 2016-07-19 HISTORY — DX: Dyspnea, unspecified: R06.00

## 2016-07-19 HISTORY — DX: Headache, unspecified: R51.9

## 2016-07-19 HISTORY — DX: Headache: R51

## 2016-07-19 HISTORY — DX: Dependence on supplemental oxygen: Z99.81

## 2016-07-19 HISTORY — DX: Unspecified osteoarthritis, unspecified site: M19.90

## 2016-07-19 LAB — PROTIME-INR
INR: 0.91
Prothrombin Time: 12.3 seconds (ref 11.4–15.2)

## 2016-07-19 LAB — CBC
HEMATOCRIT: 42.5 % (ref 39.0–52.0)
Hemoglobin: 13.9 g/dL (ref 13.0–17.0)
MCH: 29.3 pg (ref 26.0–34.0)
MCHC: 32.7 g/dL (ref 30.0–36.0)
MCV: 89.7 fL (ref 78.0–100.0)
Platelets: 230 10*3/uL (ref 150–400)
RBC: 4.74 MIL/uL (ref 4.22–5.81)
RDW: 14.8 % (ref 11.5–15.5)
WBC: 10 10*3/uL (ref 4.0–10.5)

## 2016-07-19 LAB — COMPREHENSIVE METABOLIC PANEL
ALBUMIN: 4 g/dL (ref 3.5–5.0)
ALT: 11 U/L — ABNORMAL LOW (ref 17–63)
AST: 15 U/L (ref 15–41)
Alkaline Phosphatase: 60 U/L (ref 38–126)
Anion gap: 9 (ref 5–15)
BUN: 21 mg/dL — ABNORMAL HIGH (ref 6–20)
CALCIUM: 8.8 mg/dL — AB (ref 8.9–10.3)
CHLORIDE: 105 mmol/L (ref 101–111)
CO2: 24 mmol/L (ref 22–32)
Creatinine, Ser: 0.93 mg/dL (ref 0.61–1.24)
GFR calc non Af Amer: >60 mL/min (ref >60)
GLUCOSE: 111 mg/dL — AB (ref 65–99)
POTASSIUM: 4.4 mmol/L (ref 3.5–5.1)
Sodium: 138 mmol/L (ref 135–145)
Total Bilirubin: 0.7 mg/dL (ref 0.3–1.2)
Total Protein: 7 g/dL (ref 6.5–8.1)

## 2016-07-19 LAB — BLOOD GAS, ARTERIAL
ACID-BASE EXCESS: 2.1 mmol/L — AB (ref 0.0–2.0)
BICARBONATE: 26 mmol/L (ref 20.0–28.0)
Drawn by: 421801
FIO2: 0.21
O2 SAT: 93.8 %
PO2 ART: 65.5 mmHg — AB (ref 83.0–108.0)
Patient temperature: 98.6
pCO2 arterial: 39.3 mmHg (ref 32.0–48.0)
pH, Arterial: 7.436 (ref 7.350–7.450)

## 2016-07-19 LAB — URINALYSIS, ROUTINE W REFLEX MICROSCOPIC
BILIRUBIN URINE: NEGATIVE
Bacteria, UA: NONE SEEN
Glucose, UA: NEGATIVE mg/dL
Ketones, ur: NEGATIVE mg/dL
Leukocytes, UA: NEGATIVE
Nitrite: NEGATIVE
PROTEIN: NEGATIVE mg/dL
Specific Gravity, Urine: 1.016 (ref 1.005–1.030)
WBC UA: NONE SEEN WBC/hpf (ref 0–5)
pH: 7 (ref 5.0–8.0)

## 2016-07-19 LAB — SURGICAL PCR SCREEN
MRSA, PCR: NEGATIVE
STAPHYLOCOCCUS AUREUS: NEGATIVE

## 2016-07-19 LAB — APTT: APTT: 33 s (ref 24–36)

## 2016-07-19 NOTE — Progress Notes (Signed)
PCP: Dr. Anastasia Pall [place] Cardiologist: Dr. Harrington Challenger, [place], [place]   Oxygen 2 liters/[place]  Pt. Is schedule to have stress test 07/20/16 @ Dr. Harrington Challenger' office.

## 2016-07-20 ENCOUNTER — Ambulatory Visit (HOSPITAL_BASED_OUTPATIENT_CLINIC_OR_DEPARTMENT_OTHER): Payer: Medicare Other

## 2016-07-20 DIAGNOSIS — Z01818 Encounter for other preprocedural examination: Secondary | ICD-10-CM | POA: Diagnosis not present

## 2016-07-20 DIAGNOSIS — I714 Abdominal aortic aneurysm, without rupture, unspecified: Secondary | ICD-10-CM

## 2016-07-20 MED ORDER — TECHNETIUM TC 99M TETROFOSMIN IV KIT
31.5000 | PACK | Freq: Once | INTRAVENOUS | Status: AC | PRN
  Administered 2016-07-20: 31.5 via INTRAVENOUS
  Filled 2016-07-20: qty 32

## 2016-07-20 NOTE — Progress Notes (Addendum)
Anesthesia Chart Review:  Pt is a 66 year old male scheduled for abdominal aortic endovascular stent graft on 07/27/2016 with Deitra Mayo, MD  - PCP is Anastasia Pall, MD - Saw Angelena Form, [place] with cardiology for pre-op eval. Stress test ordered for 07/20/16. Pt also c/o tachycardic episode; cardiac event monitor ordered for after surgery.   PMH includes:  MI (1980's, ? Balloon angioplasty?), HTN, AAA, emphysema, COPD. Current smoker. BMI 20.5.  Medications include: Albuterol, Symbicort, DuoNeb, lisinopril, Protonix, pravastatin, Spiriva  Preoperative labs reviewed.  CT angiogram abdomen pelvis 06/27/16: VASCULAR - Abdominal aortic aneurysm has increased in diameter from 5.2 cm to 5.6 cm. 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  NON-VASCULAR - 1.8 cm nodular parenchymal density at the right lung base may represent round atelectasis. Follow-up CT in 3 months is recommended. True pulmonary nodule is not excluded. Initial follow-up by chest CT without contrast is recommended in 3 months to confirm persistence.  - Chronic changes as described. Stability of these findings supports benign etiology.  Will revisit chart when results of stress test available.   Willeen Cass, FNP-BC [facility]/Anesthesiology Phone: [phone] 07/20/2016 1:33 PM  Addendum:   Pt had nuclear stress test 07/21/16, results below, and was cleared for surgery.   Nuclear stress test 07/21/16:   Nuclear stress EF: 53%.  There was no ST segment deviation noted during stress.  Defect 1: There is a large defect of moderate severity present in the basal inferior and mid inferior location.  Defect 2: There is a small defect of moderate severity present in the basal anteroseptal location. Low risk stress nuclear study with prominent diaphragmatic attenuation and very mild ischemia in the basal septum; EF 53 with normal wall motion.   If  no changes, I anticipate pt can proceed with surgery as scheduled.   Willeen Cass, FNP-BC [facility] Stay Surgical Center/Anesthesiology Phone: [phone] 07/21/2016 4:21 PM

## 2016-07-21 ENCOUNTER — Ambulatory Visit (HOSPITAL_COMMUNITY): Payer: Medicare Other

## 2016-07-21 DIAGNOSIS — Z01818 Encounter for other preprocedural examination: Secondary | ICD-10-CM | POA: Diagnosis not present

## 2016-07-21 LAB — MYOCARDIAL PERFUSION IMAGING
CHL CUP NUCLEAR SDS: 8
CHL CUP NUCLEAR SRS: 0
CHL CUP NUCLEAR SSS: 8
LHR: 0.25
LV dias vol: 80 mL (ref 62–150)
LV sys vol: 38 mL
NUC STRESS TID: 0.78
Peak HR: 115 {beats}/min
Rest HR: 100 {beats}/min

## 2016-07-21 MED ORDER — REGADENOSON 0.4 MG/5ML IV SOLN
0.4000 mg | Freq: Once | INTRAVENOUS | Status: AC
  Administered 2016-07-21: 0.4 mg via INTRAVENOUS

## 2016-07-21 MED ORDER — TECHNETIUM TC 99M TETROFOSMIN IV KIT
32.9000 | PACK | Freq: Once | INTRAVENOUS | Status: AC | PRN
  Administered 2016-07-21: 32.9 via INTRAVENOUS
  Filled 2016-07-21: qty 33

## 2016-07-25 ENCOUNTER — Ambulatory Visit: Payer: Medicare Other | Admitting: Physician Assistant

## 2016-07-27 ENCOUNTER — Inpatient Hospital Stay (HOSPITAL_COMMUNITY): Payer: Medicare Other | Admitting: Emergency Medicine

## 2016-07-27 ENCOUNTER — Inpatient Hospital Stay (HOSPITAL_COMMUNITY)
Admission: RE | Admit: 2016-07-27 | Discharge: 2016-07-28 | DRG: 269 | Disposition: A | Discharge status: Home / Self Care | Payer: Medicare Other | Source: Ambulatory Visit | Attending: Vascular Surgery | Admitting: Vascular Surgery

## 2016-07-27 ENCOUNTER — Other Ambulatory Visit: Payer: Self-pay

## 2016-07-27 ENCOUNTER — Encounter (HOSPITAL_COMMUNITY)
Admission: RE | Disposition: A | Discharge status: Home / Self Care | Payer: Self-pay | Source: Ambulatory Visit | Attending: Vascular Surgery

## 2016-07-27 ENCOUNTER — Inpatient Hospital Stay (HOSPITAL_COMMUNITY): Payer: Medicare Other | Admitting: Certified Registered"

## 2016-07-27 ENCOUNTER — Telehealth: Payer: Self-pay | Admitting: Vascular Surgery

## 2016-07-27 ENCOUNTER — Encounter (HOSPITAL_COMMUNITY): Payer: Self-pay

## 2016-07-27 DIAGNOSIS — I10 Essential (primary) hypertension: Secondary | ICD-10-CM | POA: Diagnosis present

## 2016-07-27 DIAGNOSIS — I714 Abdominal aortic aneurysm, without rupture: Principal | ICD-10-CM | POA: Diagnosis present

## 2016-07-27 DIAGNOSIS — F17210 Nicotine dependence, cigarettes, uncomplicated: Secondary | ICD-10-CM | POA: Diagnosis present

## 2016-07-27 DIAGNOSIS — Z9981 Dependence on supplemental oxygen: Secondary | ICD-10-CM | POA: Diagnosis not present

## 2016-07-27 DIAGNOSIS — F329 Major depressive disorder, single episode, unspecified: Secondary | ICD-10-CM | POA: Diagnosis present

## 2016-07-27 DIAGNOSIS — J449 Chronic obstructive pulmonary disease, unspecified: Secondary | ICD-10-CM | POA: Diagnosis present

## 2016-07-27 DIAGNOSIS — I252 Old myocardial infarction: Secondary | ICD-10-CM

## 2016-07-27 DIAGNOSIS — Z79899 Other long term (current) drug therapy: Secondary | ICD-10-CM | POA: Diagnosis not present

## 2016-07-27 DIAGNOSIS — Z8679 Personal history of other diseases of the circulatory system: Secondary | ICD-10-CM

## 2016-07-27 HISTORY — PX: ABDOMINAL AORTIC ENDOVASCULAR STENT GRAFT: SHX5707

## 2016-07-27 HISTORY — DX: Depression, unspecified: F32.A

## 2016-07-27 HISTORY — DX: Major depressive disorder, single episode, unspecified: F32.9

## 2016-07-27 LAB — BASIC METABOLIC PANEL
ANION GAP: 8 (ref 5–15)
BUN: 19 mg/dL (ref 6–20)
CALCIUM: 8.2 mg/dL — AB (ref 8.9–10.3)
CHLORIDE: 107 mmol/L (ref 101–111)
CO2: 26 mmol/L (ref 22–32)
Creatinine, Ser: 0.78 mg/dL (ref 0.61–1.24)
GFR calc non Af Amer: >60 mL/min (ref >60)
GLUCOSE: 111 mg/dL — AB (ref 65–99)
POTASSIUM: 4.2 mmol/L (ref 3.5–5.1)
Sodium: 141 mmol/L (ref 135–145)

## 2016-07-27 LAB — TYPE AND SCREEN
ABO/RH(D): O NEG
ANTIBODY SCREEN: POSITIVE

## 2016-07-27 LAB — CBC
HEMATOCRIT: 39.3 % (ref 39.0–52.0)
HEMOGLOBIN: 12.7 g/dL — AB (ref 13.0–17.0)
MCH: 29.6 pg (ref 26.0–34.0)
MCHC: 32.3 g/dL (ref 30.0–36.0)
MCV: 91.6 fL (ref 78.0–100.0)
Platelets: 245 10*3/uL (ref 150–400)
RBC: 4.29 MIL/uL (ref 4.22–5.81)
RDW: 15 % (ref 11.5–15.5)
WBC: 10.6 10*3/uL — AB (ref 4.0–10.5)

## 2016-07-27 LAB — MAGNESIUM: MAGNESIUM: 2.1 mg/dL (ref 1.7–2.4)

## 2016-07-27 LAB — GLUCOSE, CAPILLARY: Glucose-Capillary: 122 mg/dL — ABNORMAL HIGH (ref 65–99)

## 2016-07-27 LAB — APTT: APTT: 38 s — AB (ref 24–36)

## 2016-07-27 LAB — PROTIME-INR
INR: 1.03
Prothrombin Time: 13.5 seconds (ref 11.4–15.2)

## 2016-07-27 SURGERY — INSERTION, ENDOVASCULAR STENT GRAFT, AORTA, ABDOMINAL
Anesthesia: General

## 2016-07-27 MED ORDER — ROCURONIUM BROMIDE 10 MG/ML (PF) SYRINGE
PREFILLED_SYRINGE | INTRAVENOUS | Status: AC
  Filled 2016-07-27: qty 5

## 2016-07-27 MED ORDER — VITAMIN B-12 1000 MCG PO TABS
1000.0000 ug | ORAL_TABLET | Freq: Every day | ORAL | Status: DC
  Administered 2016-07-27 – 2016-07-28 (×2): 1000 ug via ORAL
  Filled 2016-07-27 (×2): qty 1

## 2016-07-27 MED ORDER — LACTATED RINGERS IV SOLN: INTRAVENOUS | Status: DC | PRN

## 2016-07-27 MED ORDER — PRAVASTATIN SODIUM 20 MG PO TABS
20.0000 mg | ORAL_TABLET | Freq: Every day | ORAL | Status: DC
  Administered 2016-07-27 – 2016-07-28 (×2): 20 mg via ORAL
  Filled 2016-07-27 (×2): qty 1

## 2016-07-27 MED ORDER — COLCHICINE 0.6 MG PO TABS: 0.6000 mg | ORAL_TABLET | Freq: Three times a day (TID) | ORAL | Status: DC | PRN

## 2016-07-27 MED ORDER — LABETALOL HCL 5 MG/ML IV SOLN: 10.0000 mg | INTRAVENOUS | Status: DC | PRN

## 2016-07-27 MED ORDER — ALBUTEROL SULFATE (2.5 MG/3ML) 0.083% IN NEBU: 3.0000 mL | INHALATION_SOLUTION | RESPIRATORY_TRACT | Status: DC | PRN

## 2016-07-27 MED ORDER — CHLORHEXIDINE GLUCONATE CLOTH 2 % EX PADS: 6.0000 | MEDICATED_PAD | Freq: Once | CUTANEOUS | Status: DC

## 2016-07-27 MED ORDER — ALUM & MAG HYDROXIDE-SIMETH 200-200-20 MG/5ML PO SUSP: 15.0000 mL | ORAL | Status: DC | PRN

## 2016-07-27 MED ORDER — MIDAZOLAM HCL 2 MG/2ML IJ SOLN
INTRAMUSCULAR | Status: DC | PRN
  Administered 2016-07-27: 1 mg via INTRAVENOUS

## 2016-07-27 MED ORDER — SODIUM CHLORIDE 0.9 % IV SOLN
INTRAVENOUS | Status: DC | PRN
  Administered 2016-07-27 (×2): via INTRAVENOUS

## 2016-07-27 MED ORDER — ONDANSETRON HCL 4 MG/2ML IJ SOLN
INTRAMUSCULAR | Status: AC
  Filled 2016-07-27: qty 2

## 2016-07-27 MED ORDER — GUAIFENESIN-DM 100-10 MG/5ML PO SYRP: 15.0000 mL | ORAL_SOLUTION | ORAL | Status: DC | PRN

## 2016-07-27 MED ORDER — CHOLECALCIFEROL 25 MCG (1000 UT) PO CAPS: 1000.0000 [IU] | ORAL_CAPSULE | Freq: Every day | ORAL | Status: DC

## 2016-07-27 MED ORDER — ACETAMINOPHEN 650 MG RE SUPP
325.0000 mg | RECTAL | Status: DC | PRN
Start: 2016-07-27 — End: 2016-07-28

## 2016-07-27 MED ORDER — HYDRALAZINE HCL 20 MG/ML IJ SOLN: 5.0000 mg | INTRAMUSCULAR | Status: DC | PRN

## 2016-07-27 MED ORDER — FENTANYL CITRATE (PF) 250 MCG/5ML IJ SOLN
INTRAMUSCULAR | Status: AC
  Filled 2016-07-27: qty 5

## 2016-07-27 MED ORDER — HEPARIN SODIUM (PORCINE) 1000 UNIT/ML IJ SOLN
INTRAMUSCULAR | Status: AC
  Filled 2016-07-27: qty 1

## 2016-07-27 MED ORDER — MIDAZOLAM HCL 2 MG/2ML IJ SOLN
INTRAMUSCULAR | Status: AC
  Filled 2016-07-27: qty 2

## 2016-07-27 MED ORDER — PROPOFOL 10 MG/ML IV BOLUS
INTRAVENOUS | Status: AC
  Filled 2016-07-27: qty 20

## 2016-07-27 MED ORDER — LISINOPRIL 10 MG PO TABS
10.0000 mg | ORAL_TABLET | Freq: Every day | ORAL | Status: DC
Start: 2016-07-28 — End: 2016-07-28
  Administered 2016-07-28: 10 mg via ORAL
  Filled 2016-07-27: qty 1

## 2016-07-27 MED ORDER — PHENYLEPHRINE HCL 10 MG/ML IJ SOLN
INTRAVENOUS | Status: DC | PRN
  Administered 2016-07-27: 50 ug/min via INTRAVENOUS

## 2016-07-27 MED ORDER — IODIXANOL 320 MG/ML IV SOLN
INTRAVENOUS | Status: DC | PRN
  Administered 2016-07-27: 150 mL

## 2016-07-27 MED ORDER — KCL IN DEXTROSE-NACL 20-5-0.45 MEQ/L-%-% IV SOLN
INTRAVENOUS | Status: DC
  Administered 2016-07-27: 14:00:00 via INTRAVENOUS
  Filled 2016-07-27 (×2): qty 1000

## 2016-07-27 MED ORDER — MORPHINE SULFATE (PF) 4 MG/ML IV SOLN: 2.0000 mg | INTRAVENOUS | Status: DC | PRN

## 2016-07-27 MED ORDER — ONDANSETRON HCL 4 MG/2ML IJ SOLN: 4.0000 mg | Freq: Four times a day (QID) | INTRAMUSCULAR | Status: DC | PRN

## 2016-07-27 MED ORDER — METOPROLOL TARTRATE 5 MG/5ML IV SOLN
2.0000 mg | INTRAVENOUS | Status: DC | PRN
Start: 2016-07-27 — End: 2016-07-28

## 2016-07-27 MED ORDER — HEPARIN SODIUM (PORCINE) 1000 UNIT/ML IJ SOLN
INTRAMUSCULAR | Status: DC | PRN
  Administered 2016-07-27: 6000 [IU] via INTRAVENOUS

## 2016-07-27 MED ORDER — PANTOPRAZOLE SODIUM 40 MG PO TBEC: 40.0000 mg | DELAYED_RELEASE_TABLET | Freq: Every day | ORAL | Status: DC

## 2016-07-27 MED ORDER — LIDOCAINE HCL (CARDIAC) 20 MG/ML IV SOLN
INTRAVENOUS | Status: DC | PRN
  Administered 2016-07-27: 60 mg via INTRAVENOUS

## 2016-07-27 MED ORDER — SODIUM CHLORIDE 0.9 % IV SOLN
INTRAVENOUS | Status: DC
  Administered 2016-07-27: 09:00:00 via INTRAVENOUS

## 2016-07-27 MED ORDER — IPRATROPIUM-ALBUTEROL 0.5-2.5 (3) MG/3ML IN SOLN: 3.0000 mL | RESPIRATORY_TRACT | Status: DC | PRN

## 2016-07-27 MED ORDER — SUGAMMADEX SODIUM 200 MG/2ML IV SOLN
INTRAVENOUS | Status: DC | PRN
  Administered 2016-07-27: 130 mg via INTRAVENOUS

## 2016-07-27 MED ORDER — POTASSIUM CHLORIDE CRYS ER 20 MEQ PO TBCR: 20.0000 meq | EXTENDED_RELEASE_TABLET | Freq: Every day | ORAL | Status: DC | PRN

## 2016-07-27 MED ORDER — SODIUM CHLORIDE 0.9 % IV SOLN: 500.0000 mL | Freq: Once | INTRAVENOUS | Status: DC | PRN

## 2016-07-27 MED ORDER — ALPRAZOLAM 0.5 MG PO TABS: 1.0000 mg | ORAL_TABLET | Freq: Every evening | ORAL | Status: DC | PRN

## 2016-07-27 MED ORDER — BUTALBITAL-APAP-CAFFEINE 50-325-40 MG PO TABS
1.0000 | ORAL_TABLET | ORAL | Status: DC | PRN
  Administered 2016-07-27: 1 via ORAL
  Filled 2016-07-27: qty 1

## 2016-07-27 MED ORDER — MOMETASONE FURO-FORMOTEROL FUM 200-5 MCG/ACT IN AERO
2.0000 | INHALATION_SPRAY | Freq: Two times a day (BID) | RESPIRATORY_TRACT | Status: DC
  Administered 2016-07-27 – 2016-07-28 (×2): 2 via RESPIRATORY_TRACT
  Filled 2016-07-27: qty 8.8

## 2016-07-27 MED ORDER — SUGAMMADEX SODIUM 200 MG/2ML IV SOLN
INTRAVENOUS | Status: AC
  Filled 2016-07-27: qty 2

## 2016-07-27 MED ORDER — DEXTROSE 5 % IV SOLN
1.5000 g | Freq: Two times a day (BID) | INTRAVENOUS | Status: AC
  Administered 2016-07-27 – 2016-07-28 (×2): 1.5 g via INTRAVENOUS
  Filled 2016-07-27 (×2): qty 1.5

## 2016-07-27 MED ORDER — SODIUM CHLORIDE 0.9 % IV SOLN
INTRAVENOUS | Status: DC | PRN
  Administered 2016-07-27: 11:00:00

## 2016-07-27 MED ORDER — PROPOFOL 10 MG/ML IV BOLUS
INTRAVENOUS | Status: DC | PRN
  Administered 2016-07-27: 90 mg via INTRAVENOUS

## 2016-07-27 MED ORDER — 0.9 % SODIUM CHLORIDE (POUR BTL) OPTIME
TOPICAL | Status: DC | PRN
  Administered 2016-07-27: 1000 mL

## 2016-07-27 MED ORDER — FENTANYL CITRATE (PF) 100 MCG/2ML IJ SOLN
INTRAMUSCULAR | Status: DC | PRN
  Administered 2016-07-27 (×2): 50 ug via INTRAVENOUS

## 2016-07-27 MED ORDER — ACETAMINOPHEN 325 MG PO TABS
325.0000 mg | ORAL_TABLET | ORAL | Status: DC | PRN
  Administered 2016-07-27: 650 mg via ORAL
  Filled 2016-07-27: qty 2

## 2016-07-27 MED ORDER — TIOTROPIUM BROMIDE MONOHYDRATE 2.5 MCG/ACT IN AERS: 1.0000 | INHALATION_SPRAY | Freq: Every day | RESPIRATORY_TRACT | Status: DC

## 2016-07-27 MED ORDER — FENTANYL CITRATE (PF) 100 MCG/2ML IJ SOLN: 25.0000 ug | INTRAMUSCULAR | Status: DC | PRN

## 2016-07-27 MED ORDER — LIDOCAINE 2% (20 MG/ML) 5 ML SYRINGE
INTRAMUSCULAR | Status: AC
  Filled 2016-07-27: qty 5

## 2016-07-27 MED ORDER — PANTOPRAZOLE SODIUM 40 MG PO TBEC
40.0000 mg | DELAYED_RELEASE_TABLET | Freq: Every day | ORAL | Status: DC
  Administered 2016-07-27 – 2016-07-28 (×2): 40 mg via ORAL
  Filled 2016-07-27 (×2): qty 1

## 2016-07-27 MED ORDER — VITAMIN C 500 MG PO TABS
250.0000 mg | ORAL_TABLET | Freq: Every day | ORAL | Status: DC
  Administered 2016-07-27 – 2016-07-28 (×2): 250 mg via ORAL
  Filled 2016-07-27 (×2): qty 1

## 2016-07-27 MED ORDER — ENOXAPARIN SODIUM 40 MG/0.4ML ~~LOC~~ SOLN
40.0000 mg | SUBCUTANEOUS | Status: DC
  Administered 2016-07-28: 40 mg via SUBCUTANEOUS
  Filled 2016-07-27: qty 0.4

## 2016-07-27 MED ORDER — PROTAMINE SULFATE 10 MG/ML IV SOLN
INTRAVENOUS | Status: DC | PRN
  Administered 2016-07-27 (×3): 10 mg via INTRAVENOUS

## 2016-07-27 MED ORDER — PHENOL 1.4 % MT LIQD: 1.0000 | OROMUCOSAL | Status: DC | PRN

## 2016-07-27 MED ORDER — TIOTROPIUM BROMIDE MONOHYDRATE 18 MCG IN CAPS
18.0000 ug | ORAL_CAPSULE | Freq: Every day | RESPIRATORY_TRACT | Status: DC
  Administered 2016-07-27 – 2016-07-28 (×2): 18 ug via RESPIRATORY_TRACT
  Filled 2016-07-27: qty 5

## 2016-07-27 MED ORDER — OXYCODONE-ACETAMINOPHEN 5-325 MG PO TABS
1.0000 | ORAL_TABLET | ORAL | Status: DC | PRN
  Administered 2016-07-27 – 2016-07-28 (×2): 2 via ORAL
  Filled 2016-07-27 (×2): qty 2

## 2016-07-27 MED ORDER — ALBUTEROL SULFATE HFA 108 (90 BASE) MCG/ACT IN AERS
INHALATION_SPRAY | RESPIRATORY_TRACT | Status: DC | PRN
  Administered 2016-07-27: 2 via RESPIRATORY_TRACT

## 2016-07-27 MED ORDER — DEXTROSE 5 % IV SOLN
INTRAVENOUS | Status: AC
  Filled 2016-07-27: qty 1.5

## 2016-07-27 MED ORDER — PROTAMINE SULFATE 10 MG/ML IV SOLN
INTRAVENOUS | Status: AC
  Filled 2016-07-27: qty 5

## 2016-07-27 MED ORDER — MAGNESIUM SULFATE 2 GM/50ML IV SOLN: 2.0000 g | Freq: Every day | INTRAVENOUS | Status: DC | PRN

## 2016-07-27 MED ORDER — TAMSULOSIN HCL 0.4 MG PO CAPS
0.4000 mg | ORAL_CAPSULE | Freq: Every day | ORAL | Status: DC
  Administered 2016-07-27 – 2016-07-28 (×2): 0.4 mg via ORAL
  Filled 2016-07-27 (×2): qty 1

## 2016-07-27 MED ORDER — FINASTERIDE 5 MG PO TABS
5.0000 mg | ORAL_TABLET | Freq: Every day | ORAL | Status: DC
  Administered 2016-07-27 – 2016-07-28 (×2): 5 mg via ORAL
  Filled 2016-07-27 (×2): qty 1

## 2016-07-27 MED ORDER — DOCUSATE SODIUM 100 MG PO CAPS
100.0000 mg | ORAL_CAPSULE | Freq: Every day | ORAL | Status: DC
  Administered 2016-07-28: 100 mg via ORAL
  Filled 2016-07-27: qty 1

## 2016-07-27 MED ORDER — VITAMIN D 1000 UNITS PO TABS
1000.0000 [IU] | ORAL_TABLET | Freq: Every day | ORAL | Status: DC
  Administered 2016-07-28: 1000 [IU] via ORAL
  Filled 2016-07-27 (×2): qty 1

## 2016-07-27 MED ORDER — ESCITALOPRAM OXALATE 20 MG PO TABS
20.0000 mg | ORAL_TABLET | Freq: Every day | ORAL | Status: DC
  Administered 2016-07-27 – 2016-07-28 (×2): 20 mg via ORAL
  Filled 2016-07-27 (×2): qty 1

## 2016-07-27 MED ORDER — ONDANSETRON HCL 4 MG/2ML IJ SOLN
INTRAMUSCULAR | Status: DC | PRN
  Administered 2016-07-27: 4 mg via INTRAVENOUS

## 2016-07-27 MED ORDER — ROCURONIUM BROMIDE 100 MG/10ML IV SOLN
INTRAVENOUS | Status: DC | PRN
  Administered 2016-07-27: 50 mg via INTRAVENOUS
  Administered 2016-07-27: 10 mg via INTRAVENOUS

## 2016-07-27 MED ORDER — DEXTROSE 5 % IV SOLN
1.5000 g | INTRAVENOUS | Status: AC
  Administered 2016-07-27: 1.5 g via INTRAVENOUS

## 2016-07-27 SURGICAL SUPPLY — 50 items
BLADE CLIPPER SURG (BLADE) ×3 IMPLANT
CANISTER SUCT 3000ML PPV (MISCELLANEOUS) ×3 IMPLANT
CATH ACCU-VU SIZ PIG 5F 70CM (CATHETERS) IMPLANT
CATH ANGIO 5F BER2 65CM (CATHETERS) ×3 IMPLANT
CATH BEACON 5.038 65CM KMP-01 (CATHETERS) IMPLANT
CATH OMNI FLUSH .035X70CM (CATHETERS) ×3 IMPLANT
COVER PROBE W GEL 5X96 (DRAPES) IMPLANT
DERMABOND ADVANCED (GAUZE/BANDAGES/DRESSINGS) ×2
DERMABOND ADVANCED .7 DNX12 (GAUZE/BANDAGES/DRESSINGS) ×1 IMPLANT
DEVICE CLOSURE PERCLS PRGLD 6F (VASCULAR PRODUCTS) ×4 IMPLANT
DRSG TEGADERM 2-3/8X2-3/4 SM (GAUZE/BANDAGES/DRESSINGS) ×6 IMPLANT
DRYSEAL FLEXSHEATH 12FR 33CM (SHEATH) ×2
DRYSEAL FLEXSHEATH 18FR 33CM (SHEATH) ×2
ELECT REM PT RETURN 9FT ADLT (ELECTROSURGICAL) ×6
ELECTRODE REM PT RTRN 9FT ADLT (ELECTROSURGICAL) ×2 IMPLANT
EXCLUDER TNK 28X14.5MMX12CM (Endovascular Graft) ×1 IMPLANT
EXCLUDER TRUNK 28X14.5MMX12CM (Endovascular Graft) ×3 IMPLANT
GAUZE SPONGE 2X2 8PLY STRL LF (GAUZE/BANDAGES/DRESSINGS) ×2 IMPLANT
GLOVE BIO SURGEON STRL SZ7.5 (GLOVE) ×3 IMPLANT
GLOVE BIOGEL PI IND STRL 8 (GLOVE) ×1 IMPLANT
GLOVE BIOGEL PI INDICATOR 8 (GLOVE) ×2
GOWN STRL REUS W/ TWL LRG LVL3 (GOWN DISPOSABLE) ×3 IMPLANT
GOWN STRL REUS W/TWL LRG LVL3 (GOWN DISPOSABLE) ×6
GRAFT BALLN CATH 65CM (STENTS) IMPLANT
KIT BASIN OR (CUSTOM PROCEDURE TRAY) ×3 IMPLANT
KIT ROOM TURNOVER OR (KITS) ×3 IMPLANT
LEG CONTRALATERAL 16X16X13.5 (Endovascular Graft) ×2 IMPLANT
LEG CONTRALATERAL 16X20X9.5 (Endovascular Graft) ×2 IMPLANT
NEEDLE PERC 18GX7CM (NEEDLE) ×3 IMPLANT
NS IRRIG 1000ML POUR BTL (IV SOLUTION) ×3 IMPLANT
PACK ENDOVASCULAR (PACKS) ×3 IMPLANT
PAD ARMBOARD 7.5X6 YLW CONV (MISCELLANEOUS) ×6 IMPLANT
PERCLOSE PROGLIDE 6F (VASCULAR PRODUCTS) ×12
SHEATH AVANTI 11CM 8FR (MISCELLANEOUS) IMPLANT
SHEATH BRITE TIP 8FR 23CM (MISCELLANEOUS) IMPLANT
SHEATH DRYSEAL FLEX 12FR 33CM (SHEATH) ×1 IMPLANT
SHEATH DRYSEAL FLEX 18FR 33CM (SHEATH) ×1 IMPLANT
SPONGE GAUZE 2X2 STER 10/PKG (GAUZE/BANDAGES/DRESSINGS) ×4
STAPLER VISISTAT (STAPLE) IMPLANT
STENT GRAFT BALLN CATH 65CM (STENTS)
STENT GRAFT CONTRALAT 16X13.5 (Endovascular Graft) ×1 IMPLANT
STENT GRAFT CONTRALAT 16X20X9. (Endovascular Graft) ×1 IMPLANT
STOPCOCK MORSE 400PSI 3WAY (MISCELLANEOUS) ×3 IMPLANT
SUT PROLENE 5 0 C 1 24 (SUTURE) IMPLANT
SUT VICRYL 4-0 PS2 18IN ABS (SUTURE) ×6 IMPLANT
SYR 30ML LL (SYRINGE) ×3 IMPLANT
TRAY FOLEY W/METER SILVER 16FR (SET/KITS/TRAYS/PACK) ×3 IMPLANT
TUBING HIGH PRESSURE 120CM (CONNECTOR) ×3 IMPLANT
WIRE AMPLATZ SS-J .035X180CM (WIRE) ×6 IMPLANT
WIRE BENTSON .035X145CM (WIRE) ×6 IMPLANT

## 2016-07-27 NOTE — Addendum Note (Signed)
Addended by: Lianne Cure A on: 07/27/2016 02:57 PM   Modules accepted: Orders

## 2016-07-27 NOTE — Telephone Encounter (Signed)
-----   Message from Mena Goes, RN sent at 07/27/2016 12:46 PM EDT ----- Regarding: 1 month w/ CTA   ----- Message ----- From: Angelia Mould, MD Sent: 07/27/2016  11:44 AM To: [facility] Charge Pool Subject: charge                                          PROCEDURE:   Percutaneous endovascular repair of abdominal aortic aneurysm using 3 components  CO-SURGEON's: Judeth Cornfield. Scot Dock, MD, Adele Barthel, MD  He will need a follow up visit in 1 month with a CT angiogram of the abdomen and pelvis. Thank you. CD

## 2016-07-27 NOTE — H&P (View-Only) (Signed)
Patient name: Felder Lebeda MRN: [id number] DOB: [birth year] Sex: male  REASON FOR VISIT:    Follow up of abdominal aortic aneurysm.  HPI:   Cicero Noy is a 66 y.o. male who I last saw a year ago. I have been following him with an abdominal aortic aneurysm. He was last seen by our nurse practitioner 6 months ago at that time the aneurysm was 5.4 cm in maximum diameter. He comes in for a 6 month follow up visit with a CT scan.  Since I saw him last, he denies any history of abdominal pain or back pain.  He continues to smoke a pack per day and has been smoking since he was 66 years old.  He denies any history of previous myocardial infarction or history of congestive heart failure.  He has had previous abdominal surgery. He tells me that he is had surgery on his stomach twice in the remote past.  Past Medical History:  Diagnosis Date  . AAA (abdominal aortic aneurysm) ([place])    infrarenal AAA  . Aortic aneurysm ([place])   . Benign prostatic hyperplasia   . COPD (chronic obstructive pulmonary disease) ([place])   . Panlobular emphysema ([place])   . Prostate disorder     Family History  Problem Relation Age of Onset  . COPD Sister     SOCIAL HISTORY: Social History  Substance Use Topics  . Smoking status: Current Every Day Smoker  . Smokeless tobacco: Never Used     Comment: 1 pk per day  . Alcohol use No    Allergies  Allergen Reactions  . Ativan [Lorazepam] Other (See Comments)    hallucinations    Current Outpatient Prescriptions  Medication Sig Dispense Refill  . albuterol (PROAIR HFA) 108 (90 BASE) MCG/ACT inhaler Inhale 2 puffs into the lungs every 4 (four) hours as needed for wheezing or shortness of breath. Received from [facility]    . ALPRAZolam (XANAX) 1 MG tablet TAKE 1 TABLET BY MOUTH EVERY NIGHT AT BEDTIME AS NEEDED FOR SLEEP    . butalbital-acetaminophen-caffeine (FIORICET, ESGIC) 50-325-40 MG tablet TAKE 2 TABLETS BY MOUTH EVERY 4 HOURS AS NEEDED FOR PAIN  OR HEADACHE    . Cholecalciferol (D 1000) 1000 UNITS capsule Take by mouth.    . colchicine 0.6 MG tablet TAKE 1 TABLET BY MOUTH THREE TIMES DAILY    . escitalopram (LEXAPRO) 20 MG tablet TAKE 1 TABLET BY MOUTH EVERY DAY    . finasteride (PROSCAR) 5 MG tablet Take 1 tablet by mouth daily.  3  . HYDROcodone-homatropine (HYCODAN) 5-1.5 MG/5ML syrup Reported on 06/23/2015    . ipratropium-albuterol (DUONEB) 0.5-2.5 (3) MG/3ML SOLN 3 mLs.    Marland Kitchen lisinopril (PRINIVIL,ZESTRIL) 10 MG tablet TAKE 1 TABLET BY MOUTH EVERY DAY    . pantoprazole (PROTONIX) 40 MG tablet Take 40 mg by mouth daily. Received from [facility]    . pravastatin (PRAVACHOL) 20 MG tablet Take 20 mg by mouth daily. Received from [facility]    . tamsulosin (FLOMAX) 0.4 MG CAPS capsule Take 0.4 mg by mouth. Received from [facility]    . Tiotropium Bromide Monohydrate 2.5 MCG/ACT AERS Inhale 1 puff into the lungs daily. Received from [facility]    . vitamin B-12 (CYANOCOBALAMIN) 1000 MCG tablet Take 1,000 mcg by mouth daily. Received from [facility]    . vitamin C (ASCORBIC ACID) 250 MG tablet Take 250 mg by mouth daily. Received from [facility]    . budesonide-formoterol (SYMBICORT)  160-4.5 MCG/ACT inhaler Inhale 2 puffs into the lungs.     No current facility-administered medications for this visit.     REVIEW OF SYSTEMS:  [X]  denotes positive finding, [ ]  denotes negative finding Cardiac  Comments:  Chest pain or chest pressure:    Shortness of breath upon exertion: X   Short of breath when lying flat:    Irregular heart rhythm: X       Vascular    Pain in calf, thigh, or hip brought on by ambulation:    Pain in feet at night that wakes you up from your sleep:     Blood clot in your veins:    Leg swelling:         Pulmonary    Oxygen at home: X   Productive cough:     Wheezing:         Neurologic    Sudden weakness in arms or legs:     Sudden numbness in arms or legs:     Sudden onset of difficulty  speaking or slurred speech:    Temporary loss of vision in one eye:     Problems with dizziness:         Gastrointestinal    Blood in stool:     Vomited blood:         Genitourinary    Burning when urinating:     Blood in urine:        Psychiatric    Major depression:         Hematologic    Bleeding problems:    Problems with blood clotting too easily:        Skin    Rashes or ulcers:        Constitutional    Fever or chills:     PHYSICAL EXAM:   Vitals:   07/05/16 1419  BP: 116/79  Pulse: 81  Resp: 20  Temp: 98.5 F (36.9 C)  TempSrc: Oral  SpO2: 91%  Weight: 139 lb (63 kg)  Height: 5\' 8"  (1.727 m)    GENERAL: The patient is a well-nourished male, in no acute distress. The vital signs are documented above. CARDIAC: There is a regular rate and rhythm.  VASCULAR: I do not detect carotid bruits. He has palpable femoral, popliteal, and posterior tibial pulses bilaterally. PULMONARY: There is good air exchange bilaterally without wheezing or rales. ABDOMEN: Soft and non-tender with normal pitched bowel sounds. His aneurysm is palpable and nontender. MUSCULOSKELETAL: There are no major deformities or cyanosis. NEUROLOGIC: No focal weakness or paresthesias are detected. SKIN: There are no ulcers or rashes noted. PSYCHIATRIC: The patient has a normal affect.  DATA:    CT ANGIOGRAM ABDOMEN PELVIS: I have reviewed his CT angiogram which shows that the aneurysm has enlarged to 5.6 cm. 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  An incidental finding on CT scan is a 1.8 centimeter parenchymal density at the right lung base which may simply represent some atelectasis. However a follow up CT scan in 3 months was recommended. I discussed these results with the patient.  MEDICAL ISSUES:   5.5 CM INFRARENAL ABDOMINAL AORTIC ANEURYSM: Given that the aneurysm has enlarged to 5.5 cm, I think we should  consider elective repair. He has significant COPD and emphysema and therefore an endovascular approach would be ideal. In addition he had 2 previous operations on his stomach which  would further complicate open repair. Regardless, based on his CT scan, I think he is a reasonable candidate for an endovascular repair. I will arrange for preoperative cardiac clearance. His wife is followed by Dr. Dorris Carnes and therefore we will schedule an appointment with Dr. Harrington Challenger for preoperative evaluation.  I have discussed the indications for aneurysm repair. I have explained that without repair, the risk of the aneurysm rupturing is 10% per year. We have discussed the advantages and disadvantages of open versus endovascular repair. The patient wishes to proceed with endovascular aneurysm repair (EVAR). I have discussed the potential complications of EVAR, including, but not limited to: bleeding, infection, arterial injury, graft migration, endoleak, renal failure, MI or other unpredictable medical problems. We have discussed the possibility of having to convert to open repair. We also discussed the need for continued lifelong follow-up after EVAR. All of the patients questions were answered and they are agreeable to proceed with surgery once his cardiac workup is complete.  Deitra Mayo [facility] [phone]

## 2016-07-27 NOTE — Progress Notes (Signed)
   VASCULAR SURGERY POSTOP CHECK:   Doing well.  Anticipate discharge in a.m.   SUBJECTIVE:   No complaints.  PHYSICAL EXAM:   Vitals:   07/27/16 1230 07/27/16 1245 07/27/16 1302 07/27/16 1316  BP: 122/72 126/74 (!) 149/90 (!) 152/89  Pulse: 72 72 73 74  Resp: (!) 22 (!) 22 (!) 22 (!) 25  Temp:  97.8 F (36.6 C) 98.3 F (36.8 C)   TempSrc:   Oral   SpO2: 95% 95% 92% 94%  Weight:   138 lb 7.2 oz (62.8 kg)   Height:   5\' 9"  (1.753 m)    Groins look fine. Palpable posterior tibial pulses.  LABS:   Lab Results  Component Value Date   WBC 10.6 (H) 07/27/2016   HGB 12.7 (L) 07/27/2016   HCT 39.3 07/27/2016   MCV 91.6 07/27/2016   PLT 245 07/27/2016   Lab Results  Component Value Date   CREATININE 0.78 07/27/2016   Lab Results  Component Value Date   INR 1.03 07/27/2016    PROBLEM LIST:    Active Problems:   History of abdominal aortic aneurysm (AAA)   CURRENT MEDS:   . [START ON 07/28/2016] cholecalciferol  1,000 Units Oral Daily  . [START ON 07/28/2016] docusate sodium  100 mg Oral Daily  . [START ON 07/28/2016] enoxaparin (LOVENOX) injection  40 mg Subcutaneous Q24H  . escitalopram  20 mg Oral Daily  . finasteride  5 mg Oral Daily  . [START ON 07/28/2016] lisinopril  10 mg Oral Daily  . mometasone-formoterol  2 puff Inhalation BID  . pantoprazole  40 mg Oral Daily  . pravastatin  20 mg Oral Daily  . tamsulosin  0.4 mg Oral Daily  . tiotropium  18 mcg Inhalation Daily  . vitamin B-12  1,000 mcg Oral Daily  . vitamin C  250 mg Oral Daily    Gae Gallop Beeper: [phone] Office: [phone] 07/27/2016

## 2016-07-27 NOTE — Anesthesia Preprocedure Evaluation (Addendum)
Anesthesia Evaluation  Patient identified by MRN, date of birth, ID band Patient awake    Reviewed: Allergy & Precautions, H&P , NPO status , Patient's Chart, lab work & pertinent test results  Airway Mallampati: II  TM Distance: >3 FB Neck ROM: Full    Dental no notable dental hx. (+) Edentulous Upper, Edentulous Lower, Dental Advisory Given   Pulmonary COPD,  COPD inhaler and oxygen dependent, Current Smoker,    Pulmonary exam normal breath sounds clear to auscultation       Cardiovascular hypertension, Pt. on medications + Peripheral Vascular Disease   Rhythm:Regular Rate:Normal     Neuro/Psych  Headaches, negative psych ROS   GI/Hepatic negative GI ROS, Neg liver ROS,   Endo/Other  negative endocrine ROS  Renal/GU negative Renal ROS  negative genitourinary   Musculoskeletal  (+) Arthritis , Osteoarthritis,    Abdominal   Peds  Hematology negative hematology ROS (+)   Anesthesia Other Findings   Reproductive/Obstetrics negative OB ROS                            Anesthesia Physical Anesthesia Plan  ASA: III  Anesthesia Plan: General   Post-op Pain Management:    Induction: Intravenous  Airway Management Planned: Oral ETT  Additional Equipment: Arterial line  Intra-op Plan:   Post-operative Plan: Extubation in OR and Possible Post-op intubation/ventilation  Informed Consent: I have reviewed the patients History and Physical, chart, labs and discussed the procedure including the risks, benefits and alternatives for the proposed anesthesia with the patient or authorized representative who has indicated his/her understanding and acceptance.   Dental advisory given  Plan Discussed with: CRNA  Anesthesia Plan Comments:        Anesthesia Quick Evaluation

## 2016-07-27 NOTE — Transfer of Care (Signed)
Immediate Anesthesia Transfer of Care Note  Patient: Jay Mullins  Procedure(s) Performed: Procedure(s): ABDOMINAL AORTIC ENDOVASCULAR STENT GRAFT (N/A)  Patient Location: PACU  Anesthesia Type:General  Level of Consciousness: awake and patient cooperative  Airway & Oxygen Therapy: Patient Spontanous Breathing and Patient connected to nasal cannula oxygen  Post-op Assessment: Report given to RN  Post vital signs: Reviewed and stable  Last Vitals:  Vitals:   07/27/16 0819  BP: (!) 140/92  Pulse: 91  Resp: 20  Temp: 36.6 C    Last Pain:  Vitals:   07/27/16 0910  TempSrc:   PainSc: 5          Complications: No apparent anesthesia complications

## 2016-07-27 NOTE — Interval H&P Note (Signed)
History and Physical Interval Note:  07/27/2016 9:24 AM  Jay Mullins  has presented today for surgery, with the diagnosis of Abdominal aortic aneurysm I71.4  The various methods of treatment have been discussed with the patient and family. After consideration of risks, benefits and other options for treatment, the patient has consented to  Procedure(s): ABDOMINAL AORTIC ENDOVASCULAR STENT GRAFT (N/A) as a surgical intervention .  The patient's history has been reviewed, patient examined, no change in status, stable for surgery.  I have reviewed the patient's chart and labs.  Questions were answered to the patient's satisfaction.     Deitra Mayo

## 2016-07-27 NOTE — Anesthesia Postprocedure Evaluation (Signed)
Anesthesia Post Note  Patient: Jay Mullins  Procedure(s) Performed: Procedure(s) (LRB): ABDOMINAL AORTIC ENDOVASCULAR STENT GRAFT (N/A)  Patient location during evaluation: PACU Anesthesia Type: General Level of consciousness: awake and alert Pain management: pain level controlled Vital Signs Assessment: post-procedure vital signs reviewed and stable Respiratory status: spontaneous breathing, nonlabored ventilation, respiratory function stable and patient connected to nasal cannula oxygen Cardiovascular status: blood pressure returned to baseline and stable Postop Assessment: no signs of nausea or vomiting Anesthetic complications: no       Last Vitals:  Vitals:   07/27/16 1230 07/27/16 1245  BP: 122/72 126/74  Pulse: 72 72  Resp: (!) 22 (!) 22  Temp:  36.6 C    Last Pain:  Vitals:   07/27/16 1245  TempSrc:   PainSc: 1                  Shraddha Lebron,W. EDMOND

## 2016-07-27 NOTE — Op Note (Signed)
OPERATIVE NOTE   PROCEDURE: 1. right common femoral artery cannulation under ultrasound guidance 2. "Preclose" repair of right common femoral artery, i.e. proglide placement x 2 3. Placement of contralateral iliac limb (Gore 16 mm x 13.5 cm)  PRE-OPERATIVE DIAGNOSIS: large abdominal aortic aneurysm   POST-OPERATIVE DIAGNOSIS: same as above   CO-SURGEONS:   Gae Gallop, MD, Adele Barthel, MD  ANESTHESIA: general  ESTIMATED BLOOD LOSS: see Dr. Nicole Cella note  FINDING(S): 1.  Successful exclusion of aneurysm at end of case  2.  Endoleaks: small Type 2 3.  Bilateral patent renal arteries and internal iliac arteries  SPECIMEN(S):  none  INDICATIONS:   Jay Mullins is a 66 y.o. ([birth year]) male who presents with large abdominal aortic aneurysm.  Dr. Scot Dock recommended endovascular aortic repair.  The patient is aware the risks of endovascular aortic surgery include but are not limited to: bleeding, need for transfusion, infection, death, stroke, paralysis, wound complications, spinal, pelvic and bowel ischemia, extended ventilation, anaphylactic reaction to contrast, contrast induced nephropathy, embolism, and need for additional procedure to address endoleaks.  Overall, a mortality rate of 1-2% and morbidity rate of 15% was cited.  The patient is aware of the risks and agree to proceed.   DESCRIPTION: After obtaining full informed written consent, the patient was brought back to the operating room and placed supine upon the operating table.  The patient received IV antibiotics prior to induction.  A procedure time out was completed and the correct surgical site was verified.  After obtaining adequate anesthesia, the patient was prepped and draped in the standard fashion for: open or endovascular aortic procedure.  A two surgeon technique was utilized to maintain constant control of the main body of the endograft and to expedite completion of this case.  Ipsilateral  Access See Dr. Nicole Cella note for details of ipsilateral access.  Contralateral Access I turned my attention to the right groin  Under ultrasound guidance,  the common femoral artery was then cannulated with a 18 gauge needle.  The Bentson wire was passed up into the aorta.  The needle was exchanged for a 8-Fr dilator, which was used to dilate the subcutaneous tract and arterial puncture.  The dilator was exchanged for a Proglide device.  This was deployed at 30 degrees medial rotation.  The Proglide sutures were tagged and then the Bentson wire was reloaded in the used Proglide device.  The used device was exchanged for a new Proglide device.  The new device was deployed at 30 degrees lateral rotation.  The new Proglide sutures were tagged and then the Bentson wire was reloaded in the Proglide device.  The device was exchanged for a long 8-Fr sheath.  In this fashion, the initial stage of the "Preclose" technique repair of the common femoral artery was completed.    Heparinization and Placement of Main Body of Endograft and Initial Aortogram See Dr. Nicole Cella note for details on anticoagulation and placement of main body of endograft.  I steered a Bentson wire into the suprarenal aorta with the assistance of a BER-2 catheter.  The catheter was exchanged for a marker pigtail catheter.  The catheter was placed in the suprarenal segment.  The wire was removed and the catheter was connected to the power injector circuit.  The catheter was de-airred and de-cloted.  A power injector aortogram was completed.  Dr. Scot Dock deployed the main body of the endograft in the infrarenal position (see his note for details).  Contralateral Gate Cannnulation At  this point, I exchanged the pigtail catheter for a Bentson wire.  A BER-2 catheter was loaded over the wire.  Eventually using a BER-2 and a Bentson wire, I was able to cannulate the contralateral gate.  I advanced the wire and catheter into the main body without  difficulty.  The wire was exchanged for the Amplatz wire.  I exchanged the catheter for a pigtail catheter.  I pulled the Amplatz wire back and allowed the crook of the catheter to reform.  I spun the formed catheter at the top of the main body, demonstrating intragraft location.  I replaced the Amplatz wire in the descending thoracic aorta and then pulled the marker pigtail catheter down to the flow divider.    Contralateral Limb Placement I then did a left anterior oblique pelvic injection to image the takeoff of the contralateral internal iliac artery.  Based on the measurements, I selected a 16 mm x 13.5 cm iliac limb.  The contralateral sheath was exchanged for a 12-Fr Dryseal sheath.  The iliac limb was advanced into the main body.  This limb was deployed with adequate overlap.  The stent delivery device was removed.  Ipsilateral Limb Extension At this point, Dr. Scot Dock fully deployed the ipsilateral limb and removed the main body stent delivery device.  Based on the images, ipsilateral limb extension was needed.  See Dr. Nicole Cella note for details on ipsilateral limb extension.  Aortic and Limb Molding The Q50 molding balloon was loaded on the contralateral wire and advanced into the main body.  I molded the proximal graft, overlapping segments just distal to the flow divider and the distal extent of the graft in the contralateral iliac artery.  The balloon was deflated and placed on the ipsilateral wire.  Dr. Scot Dock inflated the balloon at the overlapping segment in the ipsilateral limb and in the distal extent of the ipsilateral iliac artery.  The balloon was deflated and removed.  Completion Aortogram I replaced the pigtail catheter was replaced on the contralateral wire and advanced into the suprarenal aorta.  The wire was removed and the catheter was connected to the power injector circuit.  A power injector aortogram was completed: small proximal Type 2 vs 1 endoleak was evident with  intact bilateral renal artery flow and bilateral internal iliac artery flow.  Dr Scot Dock replaced the molding balloon on the left wire and remolded the top of the graft.  On completion aortogram the possible Type 1 endoleak was gone.  There was a delayed Type 2 endoleak present with some delayed presentation.  I replaced the Bentson wire into the catheter, straightening out the crook in the catheter.  I removed the catheter.  Dr. Scot Dock then placed a BER-2 catheter over the ipsilateral wire and then exchanged the wire for a Bentson wire.    Completion of Pre-close Repair of Contralateral Common Femoral Artery At this point, Dr. Scot Dock gave Protamine to reverse anticoagulation (see his note for details).  I turned my attention to the ipsilateral groin.  Dr. Scot Dock held pressure proximally and distally on this common femoral artery as I removed the femoral sheath.  There was no change in blood pressure with this maneuver.  I then sequentially tightened the previously tagged Proglide sutures.  There was minimal bleeding with the wire still in place, so I removed the wire.  I sequentially tightened the previous tagged Proglide sutures again and locked each set of sutures by applying tension to the locking stitch in each pair.  In  this fashion, I repaired the contralateral common femoral artery.  This exact same process was completed by Dr. Scot Dock in the ipsilateral groin, after removing the sheath on that side.  Hemostats were applied to the sutures in both groin.    After waiting a few minutes, I transected the sutures in the contralateral groin.  Dr. Scot Dock did the same with the sutures in the ipsilateral groin.  The skin incisions in both groin were repaired with U-stitches of 4-0 Vicryl.  The skin was cleaned, dried, and Dermabond used to reinforce the skin closures.  A sterile dressing was applied to both groins.   Adele Barthel, MD, FACS [facility] [place] Office:  [phone] Pager: [phone]  07/27/2016, 11:28 AM

## 2016-07-27 NOTE — Op Note (Signed)
NAME: Jay Mullins    MRN: [id number] DOB: 10-16-1950    DATE OF OPERATION: 07/27/2016  PREOP DIAGNOSIS:    5.6 cm infrarenal abdominal aortic aneurysm  POSTOP DIAGNOSIS:    Same  PROCEDURE:    Percutaneous endovascular repair of abdominal aortic aneurysm using 3 components  CO-SURGEON's: Judeth Cornfield. Scot Dock, MD, Adele Barthel, MD  ANESTHESIA: Gen.   EBL: 100 cc  INDICATIONS:    Jay Mullins is a 66 y.o. male who had been following with an abdominal aortic aneurysm. This enlarged to 5.6 cm. Given the risk of rupture, elective repair was recommended. He was felt to be a good candidate for an endovascular repair.  FINDINGS:    Completion arteriogram showed the graft and excellent position with no endoleak identified.  TECHNIQUE:    The patient was taken to the operating room and received a general anesthetic. Monitoring lines had been placed by anesthesia. The abdomen and groins were prepped and draped in usual sterile fashion. On the left side, under ultrasound guidance, the left common femoral artery was cannulated and a wedge reduced under fluoroscopic control. The artery was free closed with 2 Perclose devices. The initial Perclose device was rotated 15 medially. The second Perclose device was rotated 15 laterally. A short 8 French sheath was then placed on the left side. I then exchange the [facility] wire for an Film/video editor using a Berenstein catheter.  Similarly, on the right side, the right common femoral artery was free closed with 2 Perclose devices as dictated separately by Dr. Adele Barthel. A long 8 French sheath was placed on the right side. The patient was then heparinized.  On the left side, the 8 French sheath was exchanged for the 18 French sheath over an Amplatz wire. The 9 French sheath was advanced up to the level of the renal arteries. A PICC Catheter was placed in the right side. The trunk ipsilateral component was then placed from the left side with the  contralateral gate positioned in a crossed fashion. The device was a 28 mm x 14 mm x 12 cm device. This was positioned below the level of the renal arteries and an arteriogram obtained to demonstrate the position of the renal arteries. The proximal graft was then deployed and was in excellent position. Completion film showed good flow to both renal arteries.   Next as dictated by Dr. Adele Barthel, the contralateral gate was cannulated in the contralateral limb placed without difficulty.   I then deployed the trunk ipsilateral components fully and then the ipsilateral side was extended with a 20 mm x 9.5 cm device. Prior to placing the device and arteriotomy was obtained and then LAO projection to demonstrate the position of the hypogastric artery. The contralateral limb was deployed without difficulty. Next using the Q 50 balloon the proximal graft, overlap regions, and distal graft was ballooned. Completion arteriogram showed a slight blush proximally likely from a type II endoleak. I then re-ballooned the proximal anastomosis and follow up film showed no evidence of endoleak.  On the left side the sheath was removed and the Perclose device is secured with good hemostasis. The wire was removed and the sutures tied. Likewise, on the right side sheath was removed and the sutures were then secured with good hemostasis. Each of the incisions was closed with a 4-0 Vicryl. Liquid and was applied. At the completion of the procedure the feet were warm and well-perfused.  The patient tolerated the procedure well and was transferred  to the recovery room in stable condition. All needle and sponge counts were correct.   Deitra Mayo, MD, FACS [facility]  DATE OF DICTATION:   07/27/2016

## 2016-07-27 NOTE — Anesthesia Procedure Notes (Signed)
Procedure Name: Intubation Date/Time: 07/27/2016 10:05 AM Performed by: Sampson Si E Pre-anesthesia Checklist: Patient identified, Emergency Drugs available, Suction available and Patient being monitored Patient Re-evaluated:Patient Re-evaluated prior to inductionOxygen Delivery Method: Circle System Utilized Preoxygenation: Pre-oxygenation with 100% oxygen Intubation Type: IV induction Ventilation: Mask ventilation without difficulty, Oral airway inserted - appropriate to patient size and Two handed mask ventilation required Laryngoscope Size: Miller and 2 Tube type: Oral Tube size: 7.5 mm Number of attempts: 1 Airway Equipment and Method: Stylet and Oral airway Placement Confirmation: ETT inserted through vocal cords under direct vision,  positive ETCO2 and breath sounds checked- equal and bilateral Secured at: 21 cm Tube secured with: Tape Dental Injury: Teeth and Oropharynx as per pre-operative assessment

## 2016-07-27 NOTE — Telephone Encounter (Signed)
Sched appts 08/30/16; CTA at 10:45 at [facility] and MD at 11:45. Lm on hm# for pt to confirm appts.

## 2016-07-28 ENCOUNTER — Encounter (HOSPITAL_COMMUNITY): Payer: Self-pay | Admitting: Vascular Surgery

## 2016-07-28 LAB — BASIC METABOLIC PANEL
Anion gap: 6 (ref 5–15)
BUN: 12 mg/dL (ref 6–20)
CO2: 31 mmol/L (ref 22–32)
CREATININE: 0.91 mg/dL (ref 0.61–1.24)
Calcium: 8.5 mg/dL — ABNORMAL LOW (ref 8.9–10.3)
Chloride: 103 mmol/L (ref 101–111)
GFR calc non Af Amer: >60 mL/min (ref >60)
Glucose, Bld: 114 mg/dL — ABNORMAL HIGH (ref 65–99)
Potassium: 4.1 mmol/L (ref 3.5–5.1)
SODIUM: 140 mmol/L (ref 135–145)

## 2016-07-28 LAB — CBC
HCT: 38 % — ABNORMAL LOW (ref 39.0–52.0)
Hemoglobin: 12 g/dL — ABNORMAL LOW (ref 13.0–17.0)
MCH: 29.3 pg (ref 26.0–34.0)
MCHC: 31.6 g/dL (ref 30.0–36.0)
MCV: 92.7 fL (ref 78.0–100.0)
PLATELETS: 243 10*3/uL (ref 150–400)
RBC: 4.1 MIL/uL — ABNORMAL LOW (ref 4.22–5.81)
RDW: 15 % (ref 11.5–15.5)
WBC: 9.1 10*3/uL (ref 4.0–10.5)

## 2016-07-28 MED ORDER — OXYCODONE-ACETAMINOPHEN 5-325 MG PO TABS: 1.0000 | ORAL_TABLET | Freq: Four times a day (QID) | ORAL | 0 refills | Status: DC | PRN

## 2016-07-28 NOTE — Discharge Summary (Signed)
EVAR Discharge Summary   Jay Mullins 29-May-1950 66 y.o. male  MRN: [id number]  Admission Date: 07/27/2016  Discharge Date: 07/28/16  Physician: Angelia Mould, *  Admission Diagnosis: Abdominal aortic aneurysm I71.4   HPI:   This is a 66 y.o. male who I last saw a year ago. I have been following him with an abdominal aortic aneurysm. He was last seen by our nurse practitioner 6 months ago at that time the aneurysm was 5.4 cm in maximum diameter. He comes in for a 6 month follow up visit with a CT scan.  Since I saw him last, he denies any history of abdominal pain or back pain.  He continues to smoke a pack per day and has been smoking since he was 66 years old.  He denies any history of previous myocardial infarction or history of congestive heart failure.  He has had previous abdominal surgery. He tells me that he is had surgery on his stomach twice in the remote past.  Hospital Course:  The patient was admitted to the hospital and taken to the operating room on 07/27/2016 and underwent: Percutaneous endovascular repair of abdominal aortic aneurysm using 3 components    The pt tolerated the procedure well and was transported to the PACU in good condition.   The pt was doing well.  He had palpable pedal pulses.  He was voiding without difficulty.  He is on home oxygen.   The remainder of the hospital course consisted of increasing mobilization and increasing intake of solids without difficulty.  CBC    Component Value Date/Time   WBC 9.1 07/28/2016 0326   RBC 4.10 (L) 07/28/2016 0326   HGB 12.0 (L) 07/28/2016 0326   HCT 38.0 (L) 07/28/2016 0326   PLT 243 07/28/2016 0326   MCV 92.7 07/28/2016 0326   MCH 29.3 07/28/2016 0326   MCHC 31.6 07/28/2016 0326   RDW 15.0 07/28/2016 0326   LYMPHSABS 2.3 12/13/2014 1224   MONOABS 1.1 (H) 12/13/2014 1224   EOSABS 0.3 12/13/2014 1224   BASOSABS 0.1 12/13/2014 1224    BMET    Component Value Date/Time   NA  140 07/28/2016 0326   K 4.1 07/28/2016 0326   CL 103 07/28/2016 0326   CO2 31 07/28/2016 0326   GLUCOSE 114 (H) 07/28/2016 0326   BUN 12 07/28/2016 0326   CREATININE 0.91 07/28/2016 0326   CALCIUM 8.5 (L) 07/28/2016 0326   GFRNONAA >60 07/28/2016 0326   GFRAA >60 07/28/2016 0326       Discharge Instructions    ABDOMINAL PROCEDURE/ANEURYSM REPAIR/AORTO-BIFEMORAL BYPASS:  Call MD for increased abdominal pain; cramping diarrhea; nausea/vomiting    Complete by:  As directed    Call MD for:  redness, tenderness, or signs of infection (pain, swelling, bleeding, redness, odor or green/yellow discharge around incision site)    Complete by:  As directed    Call MD for:  severe or increased pain, loss or decreased feeling  in affected limb(s)    Complete by:  As directed    Call MD for:  temperature >100.5    Complete by:  As directed    Discharge wound care:    Complete by:  As directed    Shower daily with soap and water starting 07/29/16   Driving Restrictions    Complete by:  As directed    No driving for 2 weeks   Lifting restrictions    Complete by:  As directed    No lifting for  4 weeks   Resume previous diet    Complete by:  As directed       Discharge Diagnosis:  Abdominal aortic aneurysm I71.4  Secondary Diagnosis: Patient Active Problem List   Diagnosis Date Noted  . History of abdominal aortic aneurysm (AAA) 07/27/2016   Past Medical History:  Diagnosis Date  . AAA (abdominal aortic aneurysm) ([place])    infrarenal AAA  . Aortic aneurysm ([place])   . Arthritis   . Benign prostatic hyperplasia   . COPD (chronic obstructive pulmonary disease) ([place])   . Depression   . Dyspnea    on exertion  . Headache   . Hypertension   . Myocardial infarction ([place]) 1980's  . On home oxygen therapy    "2l; 24/7 when I'm home" (07/27/2016)  . Panlobular emphysema ([place])   . Prostate disorder      Allergies as of 07/28/2016      Reactions   Ativan [lorazepam] Other (See  Comments)   hallucinations      Medication List    TAKE these medications   ALPRAZolam 1 MG tablet Commonly known as:  XANAX TAKE 1 TABLET BY MOUTH EVERY NIGHT AT BEDTIME AS NEEDED FOR SLEEP   butalbital-acetaminophen-caffeine 50-325-40 MG tablet Commonly known as:  FIORICET, ESGIC TAKE 2 TABLETS BY MOUTH EVERY 4 HOURS AS NEEDED FOR PAIN OR HEADACHE   colchicine 0.6 MG tablet TAKE 1 TABLET BY MOUTH THREE TIMES DAILY AS NEEDED   D 1000 1000 units capsule Generic drug:  Cholecalciferol Take 1,000 Units by mouth daily.   escitalopram 20 MG tablet Commonly known as:  LEXAPRO TAKE 1 TABLET BY MOUTH EVERY DAY   finasteride 5 MG tablet Commonly known as:  PROSCAR Take 1 tablet by mouth daily.   ipratropium-albuterol 0.5-2.5 (3) MG/3ML Soln Commonly known as:  DUONEB Inhale 3 mLs into the lungs every 4 (four) hours as needed.   lisinopril 10 MG tablet Commonly known as:  PRINIVIL,ZESTRIL TAKE 1 TABLET BY MOUTH EVERY DAY   oxyCODONE-acetaminophen 5-325 MG tablet Commonly known as:  PERCOCET/ROXICET Take 1 tablet by mouth every 6 (six) hours as needed for moderate pain.   pantoprazole 40 MG tablet Commonly known as:  PROTONIX Take 40 mg by mouth daily. Received from [place]   pravastatin 20 MG tablet Commonly known as:  PRAVACHOL Take 20 mg by mouth daily. Received from [place] 108 [phone] Base) MCG/ACT inhaler Generic drug:  albuterol Inhale 2 puffs into the lungs every 4 (four) hours as needed for wheezing or shortness of breath. Received from [facility] 160-4.5 MCG/ACT inhaler Generic drug:  budesonide-formoterol Inhale 1 puff into the lungs 2 (two) times daily.   tamsulosin 0.4 MG Caps capsule Commonly known as:  FLOMAX Take 0.4 mg by mouth daily. Received from [place]   Tiotropium Bromide Monohydrate 2.5 MCG/ACT Aers Inhale 1 puff into the lungs daily. Received from [place]   vitamin B-12 1000 MCG  tablet Commonly known as:  CYANOCOBALAMIN Take 1,000 mcg by mouth daily. Received from [place] 250 MG tablet Commonly known as:  ASCORBIC ACID Take 250 mg by mouth daily. Received from [place] given: Roxicet #10 No Refill  Instructions: 1.  No heavy lifting x 4 weeks 2.  No driving x 2 weeks and while taking pain medication 3.  Shower daily starting 07/29/16  Disposition: home  Patient's condition: is  Good  Follow up: 1. Dr. Scot Dock in 4 weeks with CTA protocol   Leontine Locket, PA-C [facility] [phone] 07/28/2016  7:28 AM   - For VQI Registry use - Post-op:  Time to Extubation: [x]  In OR, [ ]  < 12 hrs, [ ]  12-24 hrs, [ ]  >=24 hrs Vasopressors Req. Post-op: No MI: No., [ ]  Troponin only, [ ]  EKG or Clinical New Arrhythmia: No CHF: No ICU Stay: 1 day in stepdown Transfusion: No     If yes, n/a units given  Complications: Resp failure: No., [ ]  Pneumonia, [ ]  Ventilator Chg in renal function: No., [ ]  Inc. Cr > 0.5, [ ]  Temp. Dialysis,  [ ]  Permanent dialysis Leg ischemia: No., no Surgery needed, [ ]  Yes, Surgery needed,  [ ]  Amputation Bowel ischemia: No., [ ]  Medical Rx, [ ]  Surgical Rx Wound complication: No., [ ]  Superficial separation/infection, [ ]  Return to OR Return to OR: No  Return to OR for bleeding: No Stroke: No., [ ]  Minor, [ ]  Major  Discharge medications: Statin use:  Yes If No:  ASA use:  No  If No:  Plavix use:  No  Beta blocker use:  No  ARB use:  No ACEI use:  Yes CCB use:  No

## 2016-07-28 NOTE — Progress Notes (Signed)
Discharge instructions given to patient and family, all questions answered at this time.  Prescription given in discharge packet.  Pt. VSS with no s/s of distress noted.  Patient stable at discharge.

## 2016-07-28 NOTE — Progress Notes (Signed)
  Progress Note    07/28/2016 7:20 AM 1 Day Post-Op  Subjective:  No complaints this morning.  Pt is voiding well.  Has not walked.  Wears O2 at home.  Tolerating diet.  Afebrile HR  60's-80's NSR 235'T-732'K systolic 02-54% 2HC6CB  Vitals:   07/27/16 2026 07/27/16 2350  BP: (!) 147/81 121/90  Pulse: 68 69  Resp: (!) 25 (!) 22  Temp: 98.3 F (36.8 C) 98.7 F (37.1 C)    Physical Exam: Cardiac:  regular Lungs:  Non labored Incisions:  Bilateral groins are soft without hematoma Extremities:  Bilateral feet warm with + palpable PT pulses bilaterally Abdomen:  Soft, NT/ND  CBC    Component Value Date/Time   WBC 9.1 07/28/2016 0326   RBC 4.10 (L) 07/28/2016 0326   HGB 12.0 (L) 07/28/2016 0326   HCT 38.0 (L) 07/28/2016 0326   PLT 243 07/28/2016 0326   MCV 92.7 07/28/2016 0326   MCH 29.3 07/28/2016 0326   MCHC 31.6 07/28/2016 0326   RDW 15.0 07/28/2016 0326   LYMPHSABS 2.3 12/13/2014 1224   MONOABS 1.1 (H) 12/13/2014 1224   EOSABS 0.3 12/13/2014 1224   BASOSABS 0.1 12/13/2014 1224    BMET    Component Value Date/Time   NA 140 07/28/2016 0326   K 4.1 07/28/2016 0326   CL 103 07/28/2016 0326   CO2 31 07/28/2016 0326   GLUCOSE 114 (H) 07/28/2016 0326   BUN 12 07/28/2016 0326   CREATININE 0.91 07/28/2016 0326   CALCIUM 8.5 (L) 07/28/2016 0326   GFRNONAA >60 07/28/2016 0326   GFRAA >60 07/28/2016 0326    INR    Component Value Date/Time   INR 1.03 07/27/2016 1145     Intake/Output Summary (Last 24 hours) at 07/28/16 0720 Last data filed at 07/28/16 0500  Gross per 24 hour  Intake             4640 ml  Output             1050 ml  Net             3590 ml     Assessment:  66 y.o. male is s/p:  Percutaneous endovascular repair of abdominal aortic aneurysm using 3 components  1 Day Post-Op  Plan: -pt doing well this morning.  He has palpable pedal pulses.   -has not walked this morning and needs to ambulate.  -discharge home later this  morning. -f/u with Dr. Scot Dock in 4 weeks with CTA   Leontine Locket, PA-C [facility] [phone] 07/28/2016 7:20 AM

## 2016-07-28 NOTE — Progress Notes (Addendum)
Attempted to wean patient off of 2L 02 less patients 02 saturation dropped to 79% on room air.  Contacted PA Rhyne updated on patients condition, received an order for home 02.  Will continue to follow up.

## 2016-07-28 NOTE — Progress Notes (Signed)
SATURATION QUALIFICATIONS: (This note is used to comply with regulatory documentation for home oxygen)  Patient Saturations on Room Air at Rest = 79%  Patient Saturations on Room Air while Ambulating = %  Patient Saturations on 2 Liters of oxygen while Ambulating = 91%  Please briefly explain why patient needs home oxygen:Pt. Unable to maintain oxygen saturation without 02.

## 2016-07-28 NOTE — Care Management Note (Signed)
Case Management Note  Patient Details  Name: Jay Mullins MRN: [id number] Date of Birth: Mar 11, 1950  Subjective/Objective:    AAA repair, for dc today, has home oxygen with [facility].  Patient sats were dropping to 79% witthout oxygen, needed new home oxygen orders, and they brought oxygen tank to room for patient to go home with.  He was also set up with [facility] for [facility] [place], [place].  Patient has agreed to their services.                 Action/Plan: DC home today with home health and oxygen [facility].   Expected Discharge Date:  07/28/16               Expected Discharge Plan:  [place]  In-House Referral:     Discharge planning Services  CM Consult  Post Acute Care Choice:    Choice offered to:  Patient  DME Arranged:    DME Agency:     HH Arranged:  RN, PT, OT [facility] Agency:  Other - See comment  Status of Service:  Completed, signed off  If discussed at [place] of Stay Meetings, dates discussed:    Additional Comments:  Zenon Mayo, RN 07/28/2016, 12:48 PM

## 2016-07-31 LAB — TYPE AND SCREEN
ABO/RH(D): O NEG
ANTIBODY SCREEN: POSITIVE
DAT, IGG: NEGATIVE
Unit division: 0
Unit division: 0

## 2016-07-31 LAB — BPAM RBC
BLOOD PRODUCT EXPIRATION DATE: 201806202359
Blood Product Expiration Date: 201806192359
UNIT TYPE AND RH: 9500
Unit Type and Rh: 9500

## 2016-08-07 ENCOUNTER — Encounter: Payer: Self-pay | Admitting: *Deleted

## 2016-08-07 ENCOUNTER — Other Ambulatory Visit: Payer: Self-pay | Admitting: Physician Assistant

## 2016-08-07 DIAGNOSIS — R000 Tachycardia, unspecified: Secondary | ICD-10-CM

## 2016-08-07 NOTE — Progress Notes (Signed)
Patient ID: Jay Mullins, male   DOB: September 22, 1950, 66 y.o.   MRN: [id number] Patient did not show up for 08/07/16, appointment, to have a cardiac event monitor applied.

## 2016-08-18 ENCOUNTER — Encounter: Payer: Self-pay | Admitting: Vascular Surgery

## 2016-08-24 ENCOUNTER — Ambulatory Visit (INDEPENDENT_AMBULATORY_CARE_PROVIDER_SITE_OTHER): Payer: Medicare Other

## 2016-08-24 DIAGNOSIS — R000 Tachycardia, unspecified: Secondary | ICD-10-CM

## 2016-08-30 ENCOUNTER — Encounter: Payer: Self-pay | Admitting: Vascular Surgery

## 2016-08-30 ENCOUNTER — Ambulatory Visit (INDEPENDENT_AMBULATORY_CARE_PROVIDER_SITE_OTHER): Payer: Self-pay | Admitting: Vascular Surgery

## 2016-08-30 ENCOUNTER — Ambulatory Visit
Admit: 2016-08-30 | Discharge: 2016-08-30 | Disposition: A | Discharge status: Home / Self Care | Payer: Medicare Other | Attending: Vascular Surgery | Admitting: Vascular Surgery

## 2016-08-30 VITALS — BP 119/73 | HR 86 | Temp 97.5°F | Resp 20 | Ht 68.0 in | Wt 126.0 lb

## 2016-08-30 DIAGNOSIS — Z8679 Personal history of other diseases of the circulatory system: Secondary | ICD-10-CM

## 2016-08-30 DIAGNOSIS — I714 Abdominal aortic aneurysm, without rupture, unspecified: Secondary | ICD-10-CM

## 2016-08-30 DIAGNOSIS — Z48812 Encounter for surgical aftercare following surgery on the circulatory system: Secondary | ICD-10-CM

## 2016-08-30 MED ORDER — IOPAMIDOL (ISOVUE-370) INJECTION 76%
75.0000 mL | Freq: Once | INTRAVENOUS | Status: AC | PRN
  Administered 2016-08-30: 75 mL via INTRAVENOUS

## 2016-08-30 NOTE — Progress Notes (Signed)
Patient name: Jay Mullins MRN: [id number] DOB: [birth year] Sex: male  REASON FOR VISIT:    Follow up after endovascular aneurysm repair  HPI:   Jay Mullins is a pleasant 66 y.o. male who underwent percutaneous endovascular aneurysm repair using 3 components on 07/27/2016. The aneurysm was 5.6 cm in maximum diameter. He comes in for a one-month follow up visit. The patient denies any abdominal pain or back pain. He has been gradually resuming his normal activities. He does continue to smoke 1 pack per day of cigarettes. He tells me that he is not taking aspirin nor is he on a statin.  Past Medical History:  Diagnosis Date  . AAA (abdominal aortic aneurysm) ([place])    infrarenal AAA  . Aortic aneurysm ([place])   . Arthritis   . Benign prostatic hyperplasia   . COPD (chronic obstructive pulmonary disease) ([place])   . Depression   . Dyspnea    on exertion  . Headache   . Hypertension   . Myocardial infarction ([place]) 1980's  . On home oxygen therapy    "2l; 24/7 when I'm home" (07/27/2016)  . Panlobular emphysema ([place])   . Prostate disorder     Family History  Problem Relation Age of Onset  . COPD Sister     SOCIAL HISTORY: Social History  Substance Use Topics  . Smoking status: Current Every Day Smoker    Packs/day: 1.00    Years: 40.00    Types: Cigarettes  . Smokeless tobacco: Former Systems developer    Types: Snuff, Chew     Comment: 07/27/2016 "quit chew/snuff when I was a kid"  . Alcohol use No    Allergies  Allergen Reactions  . Ativan [Lorazepam] Other (See Comments)    hallucinations    Current Outpatient Prescriptions  Medication Sig Dispense Refill  . albuterol (PROAIR HFA) 108 (90 BASE) MCG/ACT inhaler Inhale 2 puffs into the lungs every 4 (four) hours as needed for wheezing or shortness of breath. Received from [facility]    . ALPRAZolam (XANAX) 1 MG tablet TAKE 1 TABLET BY MOUTH EVERY NIGHT AT BEDTIME AS NEEDED FOR SLEEP    . budesonide-formoterol (SYMBICORT)  160-4.5 MCG/ACT inhaler Inhale 1 puff into the lungs 2 (two) times daily.     . butalbital-acetaminophen-caffeine (FIORICET, ESGIC) 50-325-40 MG tablet TAKE 2 TABLETS BY MOUTH EVERY 4 HOURS AS NEEDED FOR PAIN OR HEADACHE    . Cholecalciferol (D 1000) 1000 units capsule Take 1,000 Units by mouth daily.     . colchicine 0.6 MG tablet TAKE 1 TABLET BY MOUTH THREE TIMES DAILY AS NEEDED    . escitalopram (LEXAPRO) 20 MG tablet TAKE 1 TABLET BY MOUTH EVERY DAY    . finasteride (PROSCAR) 5 MG tablet Take 1 tablet by mouth daily.  3  . ipratropium-albuterol (DUONEB) 0.5-2.5 (3) MG/3ML SOLN Inhale 3 mLs into the lungs every 4 (four) hours as needed.     [place] lisinopril (PRINIVIL,ZESTRIL) 10 MG tablet TAKE 1 TABLET BY MOUTH EVERY DAY    . oxyCODONE-acetaminophen (PERCOCET/ROXICET) 5-325 MG tablet Take 1 tablet by mouth every 6 (six) hours as needed for moderate pain. 10 tablet 0  . pantoprazole (PROTONIX) 40 MG tablet Take 40 mg by mouth daily. Received from [facility]    . pravastatin (PRAVACHOL) 20 MG tablet Take 20 mg by mouth daily. Received from [facility]    . tamsulosin (FLOMAX) 0.4 MG CAPS capsule Take 0.4 mg by mouth daily. Received from [facility]     .  Tiotropium Bromide Monohydrate 2.5 MCG/ACT AERS Inhale 1 puff into the lungs daily. Received from [facility]    . vitamin B-12 (CYANOCOBALAMIN) 1000 MCG tablet Take 1,000 mcg by mouth daily. Received from [facility]    . vitamin C (ASCORBIC ACID) 250 MG tablet Take 250 mg by mouth daily. Received from [facility]     No current facility-administered medications for this visit.     REVIEW OF SYSTEMS:  [X]  denotes positive finding, [ ]  denotes negative finding Cardiac  Comments:  Chest pain or chest pressure:    Shortness of breath upon exertion:    Short of breath when lying flat:    Irregular heart rhythm:        Vascular    Pain in calf, thigh, or hip brought on by ambulation:    Pain in feet at night that wakes you up from  your sleep:     Blood clot in your veins:    Leg swelling:         Pulmonary    Oxygen at home:    Productive cough:     Wheezing:         Neurologic    Sudden weakness in arms or legs:     Sudden numbness in arms or legs:     Sudden onset of difficulty speaking or slurred speech:    Temporary loss of vision in one eye:     Problems with dizziness:         Gastrointestinal    Blood in stool:     Vomited blood:         Genitourinary    Burning when urinating:     Blood in urine:        Psychiatric    Major depression:         Hematologic    Bleeding problems:    Problems with blood clotting too easily:        Skin    Rashes or ulcers:        Constitutional    Fever or chills:     PHYSICAL EXAM:   Vitals:   08/30/16 1212  BP: 119/73  Pulse: 86  Resp: 20  Temp: 97.5 F (36.4 C)  TempSrc: Oral  SpO2: 98%  Weight: 126 lb (57.2 kg)  Height: 5\' 8"  (1.727 m)    GENERAL: The patient is a well-nourished male, in no acute distress. The vital signs are documented above. CARDIAC: There is a regular rate and rhythm.  VASCULAR: He has palpable femoral pulses and a palpable right posterior tibial pulse. Both feet are warm and well-perfused. He has no lower extremity swelling. Both groin sites look fine from where he underwent catheterization. PULMONARY: There is good air exchange bilaterally without wheezing or rales. ABDOMEN: Soft and non-tender with normal pitched bowel sounds.  MUSCULOSKELETAL: There are no major deformities or cyanosis. NEUROLOGIC: No focal weakness or paresthesias are detected. SKIN: There are no ulcers or rashes noted. PSYCHIATRIC: The patient has a normal affect.  DATA:    CT SCAN ABDOMEN PELVIS: I have reviewed the CT angiogram images today and the maximum diameter of the aneurysm is 5.5 cm which is decreased slightly compared to the previous study in which it was 5.6 cm. 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 The aneurysm  is stable in size. 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 This reason I have ordered a CT angiogram of the abdomen and pelvis in 6 months and I'll see him back at that time. We have again discussed the importance of tobacco cessation. I have asked him to please begin taking an aspirin 81 mg a day. He will also discuss potentially starting a statin with his primary care physician. I have explained that the vascular literature suggests significant improvement and mortality in vascular patients who are on both an aspirin and a low-dose statin.   Deitra Mayo [facility] [place] [phone]

## 2016-09-13 NOTE — Addendum Note (Signed)
Addended by: Lianne Cure A on: 09/13/2016 10:07 AM   Modules accepted: Orders

## 2016-09-16 ENCOUNTER — Telehealth: Payer: Self-pay | Admitting: Physician Assistant

## 2016-09-16 NOTE — Telephone Encounter (Signed)
Paged for Hr of 170-180 x 1-2 hours. BP normal in 120s. Unable to give reading. The patient denies  chest pain, palpitations,  dizziness, syncope, cough, congestion, abdominal pain, hematochezia, melena, lower extremity edema.  Took Xanax and put CPAP without improvement. During my conversation, BP machine indicated Error multiple times. Breathing worsen. Advised to call EMS or go to nearest ER/urgent care. Will call EMS.

## 2016-11-18 ENCOUNTER — Encounter (HOSPITAL_COMMUNITY): Payer: Self-pay | Admitting: *Deleted

## 2016-11-18 ENCOUNTER — Inpatient Hospital Stay (HOSPITAL_COMMUNITY)
Admission: EM | Admit: 2016-11-18 | Discharge: 2016-11-20 | DRG: 871 | Disposition: A | Discharge status: Home / Self Care | Payer: Medicare Other | Attending: Internal Medicine | Admitting: Internal Medicine

## 2016-11-18 ENCOUNTER — Emergency Department (HOSPITAL_COMMUNITY): Payer: Medicare Other

## 2016-11-18 DIAGNOSIS — M1990 Unspecified osteoarthritis, unspecified site: Secondary | ICD-10-CM | POA: Diagnosis present

## 2016-11-18 DIAGNOSIS — J159 Unspecified bacterial pneumonia: Secondary | ICD-10-CM | POA: Diagnosis present

## 2016-11-18 DIAGNOSIS — Z8249 Family history of ischemic heart disease and other diseases of the circulatory system: Secondary | ICD-10-CM | POA: Diagnosis not present

## 2016-11-18 DIAGNOSIS — I252 Old myocardial infarction: Secondary | ICD-10-CM

## 2016-11-18 DIAGNOSIS — J9621 Acute and chronic respiratory failure with hypoxia: Secondary | ICD-10-CM | POA: Diagnosis present

## 2016-11-18 DIAGNOSIS — Z825 Family history of asthma and other chronic lower respiratory diseases: Secondary | ICD-10-CM | POA: Diagnosis not present

## 2016-11-18 DIAGNOSIS — I10 Essential (primary) hypertension: Secondary | ICD-10-CM | POA: Diagnosis present

## 2016-11-18 DIAGNOSIS — Z7951 Long term (current) use of inhaled steroids: Secondary | ICD-10-CM | POA: Diagnosis not present

## 2016-11-18 DIAGNOSIS — F17200 Nicotine dependence, unspecified, uncomplicated: Secondary | ICD-10-CM | POA: Diagnosis present

## 2016-11-18 DIAGNOSIS — J449 Chronic obstructive pulmonary disease, unspecified: Secondary | ICD-10-CM | POA: Diagnosis not present

## 2016-11-18 DIAGNOSIS — J9622 Acute and chronic respiratory failure with hypercapnia: Secondary | ICD-10-CM

## 2016-11-18 DIAGNOSIS — F329 Major depressive disorder, single episode, unspecified: Secondary | ICD-10-CM | POA: Diagnosis present

## 2016-11-18 DIAGNOSIS — R3129 Other microscopic hematuria: Secondary | ICD-10-CM | POA: Diagnosis present

## 2016-11-18 DIAGNOSIS — F17210 Nicotine dependence, cigarettes, uncomplicated: Secondary | ICD-10-CM | POA: Diagnosis not present

## 2016-11-18 DIAGNOSIS — J441 Chronic obstructive pulmonary disease with (acute) exacerbation: Secondary | ICD-10-CM | POA: Diagnosis present

## 2016-11-18 DIAGNOSIS — A419 Sepsis, unspecified organism: Secondary | ICD-10-CM | POA: Diagnosis present

## 2016-11-18 DIAGNOSIS — Z8679 Personal history of other diseases of the circulatory system: Secondary | ICD-10-CM

## 2016-11-18 DIAGNOSIS — J431 Panlobular emphysema: Secondary | ICD-10-CM | POA: Diagnosis present

## 2016-11-18 DIAGNOSIS — Z9981 Dependence on supplemental oxygen: Secondary | ICD-10-CM

## 2016-11-18 DIAGNOSIS — Z79899 Other long term (current) drug therapy: Secondary | ICD-10-CM | POA: Diagnosis not present

## 2016-11-18 DIAGNOSIS — Z888 Allergy status to other drugs, medicaments and biological substances status: Secondary | ICD-10-CM | POA: Diagnosis not present

## 2016-11-18 DIAGNOSIS — Z7982 Long term (current) use of aspirin: Secondary | ICD-10-CM | POA: Diagnosis not present

## 2016-11-18 DIAGNOSIS — J189 Pneumonia, unspecified organism: Secondary | ICD-10-CM | POA: Diagnosis not present

## 2016-11-18 LAB — COMPREHENSIVE METABOLIC PANEL
ALBUMIN: 3.6 g/dL (ref 3.5–5.0)
ALK PHOS: 75 U/L (ref 38–126)
ALT: 9 U/L — ABNORMAL LOW (ref 17–63)
ANION GAP: 13 (ref 5–15)
AST: 17 U/L (ref 15–41)
BILIRUBIN TOTAL: 0.6 mg/dL (ref 0.3–1.2)
BUN: 21 mg/dL — ABNORMAL HIGH (ref 6–20)
CALCIUM: 9.1 mg/dL (ref 8.9–10.3)
CO2: 26 mmol/L (ref 22–32)
Chloride: 97 mmol/L — ABNORMAL LOW (ref 101–111)
Creatinine, Ser: 1.15 mg/dL (ref 0.61–1.24)
GFR calc non Af Amer: >60 mL/min (ref >60)
GLUCOSE: 133 mg/dL — AB (ref 65–99)
Potassium: 4.2 mmol/L (ref 3.5–5.1)
SODIUM: 136 mmol/L (ref 135–145)
TOTAL PROTEIN: 7.1 g/dL (ref 6.5–8.1)

## 2016-11-18 LAB — CBC WITH DIFFERENTIAL/PLATELET
BASOS ABS: 0 10*3/uL (ref 0.0–0.1)
Basophils Relative: 0 %
EOS ABS: 0 10*3/uL (ref 0.0–0.7)
EOS PCT: 0 %
HCT: 44.4 % (ref 39.0–52.0)
Hemoglobin: 14.7 g/dL (ref 13.0–17.0)
Lymphocytes Relative: 3 %
Lymphs Abs: 0.6 10*3/uL — ABNORMAL LOW (ref 0.7–4.0)
MCH: 29.8 pg (ref 26.0–34.0)
MCHC: 33.1 g/dL (ref 30.0–36.0)
MCV: 90.1 fL (ref 78.0–100.0)
MONO ABS: 1.8 10*3/uL — AB (ref 0.1–1.0)
Monocytes Relative: 9 %
Neutro Abs: 17.1 10*3/uL — ABNORMAL HIGH (ref 1.7–7.7)
Neutrophils Relative %: 88 %
PLATELETS: 277 10*3/uL (ref 150–400)
RBC: 4.93 MIL/uL (ref 4.22–5.81)
RDW: 15.3 % (ref 11.5–15.5)
WBC: 19.5 10*3/uL — ABNORMAL HIGH (ref 4.0–10.5)

## 2016-11-18 LAB — I-STAT ARTERIAL BLOOD GAS, ED
Acid-Base Excess: 3 mmol/L — ABNORMAL HIGH (ref 0.0–2.0)
Bicarbonate: 29 mmol/L — ABNORMAL HIGH (ref 20.0–28.0)
O2 SAT: 94 %
PH ART: 7.4 (ref 7.350–7.450)
TCO2: 30 mmol/L (ref 22–32)
pCO2 arterial: 46.8 mmHg (ref 32.0–48.0)
pO2, Arterial: 74 mmHg — ABNORMAL LOW (ref 83.0–108.0)

## 2016-11-18 LAB — I-STAT CG4 LACTIC ACID, ED: LACTIC ACID, VENOUS: 2.35 mmol/L — AB (ref 0.5–1.9)

## 2016-11-18 MED ORDER — PIPERACILLIN-TAZOBACTAM 3.375 G IVPB: 3.3750 g | Freq: Three times a day (TID) | INTRAVENOUS | Status: DC

## 2016-11-18 MED ORDER — VANCOMYCIN HCL 10 G IV SOLR
1250.0000 mg | Freq: Once | INTRAVENOUS | Status: AC
  Administered 2016-11-18: 1250 mg via INTRAVENOUS
  Filled 2016-11-18: qty 1250

## 2016-11-18 MED ORDER — FINASTERIDE 5 MG PO TABS
5.0000 mg | ORAL_TABLET | Freq: Every day | ORAL | Status: DC
  Administered 2016-11-18 – 2016-11-19 (×2): 5 mg via ORAL
  Filled 2016-11-18 (×2): qty 1

## 2016-11-18 MED ORDER — PANTOPRAZOLE SODIUM 40 MG PO TBEC
40.0000 mg | DELAYED_RELEASE_TABLET | Freq: Every day | ORAL | Status: DC
  Administered 2016-11-19 – 2016-11-20 (×2): 40 mg via ORAL
  Filled 2016-11-18 (×2): qty 1

## 2016-11-18 MED ORDER — ESCITALOPRAM OXALATE 10 MG PO TABS
20.0000 mg | ORAL_TABLET | Freq: Every day | ORAL | Status: DC
  Administered 2016-11-19 – 2016-11-20 (×2): 20 mg via ORAL
  Filled 2016-11-18 (×2): qty 2

## 2016-11-18 MED ORDER — ASPIRIN EC 81 MG PO TBEC
81.0000 mg | DELAYED_RELEASE_TABLET | Freq: Every day | ORAL | Status: DC
  Administered 2016-11-19 – 2016-11-20 (×2): 81 mg via ORAL
  Filled 2016-11-18 (×2): qty 1

## 2016-11-18 MED ORDER — NICOTINE 21 MG/24HR TD PT24
21.0000 mg | MEDICATED_PATCH | Freq: Every day | TRANSDERMAL | Status: DC
  Administered 2016-11-18 – 2016-11-19 (×2): 21 mg via TRANSDERMAL
  Filled 2016-11-18 (×2): qty 1

## 2016-11-18 MED ORDER — SODIUM CHLORIDE 0.9 % IV BOLUS (SEPSIS)
1000.0000 mL | Freq: Once | INTRAVENOUS | Status: AC
  Administered 2016-11-18: 1000 mL via INTRAVENOUS

## 2016-11-18 MED ORDER — ACETAMINOPHEN 650 MG RE SUPP: 650.0000 mg | Freq: Four times a day (QID) | RECTAL | Status: DC | PRN

## 2016-11-18 MED ORDER — ALBUTEROL SULFATE (2.5 MG/3ML) 0.083% IN NEBU
2.5000 mg | INHALATION_SOLUTION | RESPIRATORY_TRACT | Status: DC | PRN
  Administered 2016-11-18 – 2016-11-20 (×6): 2.5 mg via RESPIRATORY_TRACT
  Filled 2016-11-18 (×6): qty 3

## 2016-11-18 MED ORDER — ALPRAZOLAM 0.5 MG PO TABS
0.5000 mg | ORAL_TABLET | Freq: Every day | ORAL | Status: DC | PRN
  Administered 2016-11-19 – 2016-11-20 (×2): 1 mg via ORAL
  Filled 2016-11-18 (×3): qty 2

## 2016-11-18 MED ORDER — VANCOMYCIN HCL 500 MG IV SOLR: 500.0000 mg | Freq: Two times a day (BID) | INTRAVENOUS | Status: DC

## 2016-11-18 MED ORDER — METHYLPREDNISOLONE SODIUM SUCC 125 MG IJ SOLR
125.0000 mg | Freq: Once | INTRAMUSCULAR | Status: AC
  Administered 2016-11-18: 125 mg via INTRAVENOUS
  Filled 2016-11-18: qty 2

## 2016-11-18 MED ORDER — PIPERACILLIN-TAZOBACTAM 3.375 G IVPB 30 MIN
3.3750 g | Freq: Once | INTRAVENOUS | Status: AC
  Administered 2016-11-18: 3.375 g via INTRAVENOUS
  Filled 2016-11-18: qty 50

## 2016-11-18 MED ORDER — SODIUM CHLORIDE 0.9 % IV SOLN
INTRAVENOUS | Status: AC
  Administered 2016-11-18 – 2016-11-19 (×2): via INTRAVENOUS

## 2016-11-18 MED ORDER — PRAVASTATIN SODIUM 40 MG PO TABS
20.0000 mg | ORAL_TABLET | Freq: Every day | ORAL | Status: DC
  Administered 2016-11-18 – 2016-11-19 (×2): 20 mg via ORAL
  Filled 2016-11-18 (×2): qty 1

## 2016-11-18 MED ORDER — ALBUTEROL (5 MG/ML) CONTINUOUS INHALATION SOLN
10.0000 mg/h | INHALATION_SOLUTION | Freq: Once | RESPIRATORY_TRACT | Status: DC
  Filled 2016-11-18: qty 80

## 2016-11-18 MED ORDER — ALPRAZOLAM 0.25 MG PO TABS
1.0000 mg | ORAL_TABLET | Freq: Every day | ORAL | Status: DC
  Administered 2016-11-18 – 2016-11-19 (×2): 1 mg via ORAL
  Filled 2016-11-18 (×2): qty 4

## 2016-11-18 MED ORDER — DEXTROSE 5 % IV SOLN
500.0000 mg | INTRAVENOUS | Status: DC
  Administered 2016-11-19: 500 mg via INTRAVENOUS
  Filled 2016-11-18 (×2): qty 500

## 2016-11-18 MED ORDER — CEFTRIAXONE SODIUM 1 G IJ SOLR
1.0000 g | INTRAMUSCULAR | Status: DC
  Administered 2016-11-19 – 2016-11-20 (×2): 1 g via INTRAVENOUS
  Filled 2016-11-18 (×2): qty 10

## 2016-11-18 MED ORDER — ALBUTEROL SULFATE (2.5 MG/3ML) 0.083% IN NEBU
5.0000 mg | INHALATION_SOLUTION | Freq: Once | RESPIRATORY_TRACT | Status: DC
  Administered 2016-11-18: 10 mg via RESPIRATORY_TRACT

## 2016-11-18 MED ORDER — ACETAMINOPHEN 325 MG PO TABS: 650.0000 mg | ORAL_TABLET | Freq: Four times a day (QID) | ORAL | Status: DC | PRN

## 2016-11-18 MED ORDER — MOMETASONE FURO-FORMOTEROL FUM 200-5 MCG/ACT IN AERO
2.0000 | INHALATION_SPRAY | Freq: Two times a day (BID) | RESPIRATORY_TRACT | Status: DC
  Administered 2016-11-18 – 2016-11-20 (×4): 2 via RESPIRATORY_TRACT
  Filled 2016-11-18: qty 8.8

## 2016-11-18 MED ORDER — ENOXAPARIN SODIUM 40 MG/0.4ML ~~LOC~~ SOLN
40.0000 mg | SUBCUTANEOUS | Status: DC
  Administered 2016-11-18 – 2016-11-19 (×2): 40 mg via SUBCUTANEOUS
  Filled 2016-11-18 (×2): qty 0.4

## 2016-11-18 MED ORDER — PIPERACILLIN-TAZOBACTAM 3.375 G IVPB: 3.3750 g | Freq: Once | INTRAVENOUS | Status: DC

## 2016-11-18 MED ORDER — IPRATROPIUM-ALBUTEROL 0.5-2.5 (3) MG/3ML IN SOLN
3.0000 mL | Freq: Four times a day (QID) | RESPIRATORY_TRACT | Status: DC
  Administered 2016-11-18 – 2016-11-19 (×4): 3 mL via RESPIRATORY_TRACT
  Filled 2016-11-18 (×4): qty 3

## 2016-11-18 NOTE — ED Provider Notes (Signed)
[facility] DEPT Provider Note   CSN: [id number] Arrival date & time: 11/18/16  1422     History   Chief Complaint Chief Complaint  Patient presents with  . Shortness of Breath    HPI Jay Mullins is a 66 y.o. male.  HPI   Patient is a 66 year old male presenting with shortness of breath. Patient has COPD, current smoker, presented with 2- 3 days of worsening shortness by the cough. Patient's been using inhaler every couple hours with no help. Patient is on 3 L of home oxygen.  Past Medical History:  Diagnosis Date  . AAA (abdominal aortic aneurysm) ([place])    infrarenal AAA  . Aortic aneurysm ([place])   . Arthritis   . Benign prostatic hyperplasia   . COPD (chronic obstructive pulmonary disease) ([place])   . Depression   . Dyspnea    on exertion  . Headache   . Hypertension   . Myocardial infarction ([place]) 1980's  . On home oxygen therapy    "2l; 24/7 when I'm home" (07/27/2016)  . Panlobular emphysema ([place])   . Prostate disorder     Patient Active Problem List   Diagnosis Date Noted  . History of abdominal aortic aneurysm (AAA) 07/27/2016    Past Surgical History:  Procedure Laterality Date  . ABDOMINAL AORTIC ENDOVASCULAR STENT GRAFT N/A 07/27/2016   Procedure: ABDOMINAL AORTIC ENDOVASCULAR STENT GRAFT;  Surgeon: Angelia Mould, MD;  Location: [facility];  Service: Vascular;  Laterality: N/A;  . BRAIN SURGERY  Jul 14, 2013   Mass front Left side  . CARDIAC CATHETERIZATION  1980's  . LAPAROTOMY  1970's   ulcer ruptured       Home Medications    Prior to Admission medications   Medication Sig Start Date End Date Taking? Authorizing Provider  albuterol (PROAIR HFA) 108 (90 BASE) MCG/ACT inhaler Inhale 2 puffs into the lungs every 4 (four) hours as needed for wheezing or shortness of breath. Received from [facility]    [provider]  ALPRAZolam Duanne Moron) 1 MG tablet TAKE 1 TABLET BY MOUTH EVERY NIGHT AT BEDTIME AS NEEDED FOR SLEEP 12/14/14    [provider]  budesonide-formoterol (SYMBICORT) 160-4.5 MCG/ACT inhaler Inhale 1 puff into the lungs 2 (two) times daily.     [provider]  butalbital-acetaminophen-caffeine (FIORICET, ESGIC) 50-325-40 MG tablet TAKE 2 TABLETS BY MOUTH EVERY 4 HOURS AS NEEDED FOR PAIN OR HEADACHE 11/30/14   [provider]  Cholecalciferol (D 1000) 1000 units capsule Take 1,000 Units by mouth daily.     [provider]  colchicine 0.6 MG tablet TAKE 1 TABLET BY MOUTH THREE TIMES DAILY AS NEEDED 11/16/14   [provider]  escitalopram (LEXAPRO) 20 MG tablet TAKE 1 TABLET BY MOUTH EVERY DAY 11/30/14   [provider]  finasteride (PROSCAR) 5 MG tablet Take 1 tablet by mouth daily. 10/17/15   [provider]  ipratropium-albuterol (DUONEB) 0.5-2.5 (3) MG/3ML SOLN Inhale 3 mLs into the lungs every 4 (four) hours as needed.  11/26/14   [provider]  lisinopril (PRINIVIL,ZESTRIL) 10 MG tablet TAKE 1 TABLET BY MOUTH EVERY DAY 12/16/14   [provider]  oxyCODONE-acetaminophen (PERCOCET/ROXICET) 5-325 MG tablet Take 1 tablet by mouth every 6 (six) hours as needed for moderate pain. 07/28/16   Rhyne, Hulen Shouts, PA-C  pantoprazole (PROTONIX) 40 MG tablet Take 40 mg by mouth daily. Received from [facility]    [provider]  pravastatin (PRAVACHOL) 20 MG tablet  Take 20 mg by mouth daily. Received from [facility]    [provider]  tamsulosin (FLOMAX) 0.4 MG CAPS capsule Take 0.4 mg by mouth daily. Received from [facility]     [provider]  Tiotropium Bromide Monohydrate 2.5 MCG/ACT AERS Inhale 1 puff into the lungs daily. Received from [facility]    [provider]  vitamin B-12 (CYANOCOBALAMIN) 1000 MCG tablet Take 1,000 mcg by mouth daily. Received from [facility]    [provider]  vitamin C (ASCORBIC ACID) 250 MG tablet Take 250 mg by mouth daily. Received from [facility]     [provider]    Family History Family History  Problem Relation Age of Onset  . COPD Sister     Social History Social History  Substance Use Topics  . Smoking status: Current Every Day Smoker    Packs/day: 1.00    Years: 40.00    Types: Cigarettes  . Smokeless tobacco: Former Systems developer    Types: Snuff, Chew     Comment: 07/27/2016 "quit chew/snuff when I was a kid"  . Alcohol use No     Allergies   Ativan [lorazepam]   Review of Systems Review of Systems  Constitutional: Positive for fatigue and fever. Negative for activity change.  Respiratory: Positive for cough, chest tightness and shortness of breath.   Cardiovascular: Negative for chest pain.  Gastrointestinal: Negative for abdominal pain.  All other systems reviewed and are negative.    Physical Exam Updated Vital Signs BP 104/63   Pulse (!) 114   Temp (S) 100 F (37.8 C) (Oral)   Resp (!) 30   SpO2 93%   Physical Exam  Constitutional: He is oriented to person, place, and time. He appears well-nourished.  HENT:  Head: Normocephalic.  Eyes: Conjunctivae are normal.  Cardiovascular: Regular rhythm.   Tachycardic.  Pulmonary/Chest: He is in respiratory distress. He has wheezes.  Increased work of breathing. Accessory muscle use. Diffuse  wheezes distant.  Abdominal: Soft. He exhibits no distension. There is no tenderness.  Neurological: He is oriented to person, place, and time.  Skin: Skin is warm and dry. He is not diaphoretic.  Psychiatric: He has a normal mood and affect. His behavior is normal.     ED Treatments / Results  Labs (all labs ordered are listed, but only abnormal results are displayed) Labs Reviewed  CBC WITH DIFFERENTIAL/PLATELET - Abnormal; Notable for the following:       Result Value   WBC 19.5 (*)    Neutro Abs 17.1 (*)    Lymphs Abs 0.6 (*)    Monocytes Absolute 1.8 (*)    All other components within normal limits  I-STAT CG4 LACTIC ACID, ED - Abnormal;  Notable for the following:    Lactic Acid, Venous 2.35 (*)    All other components within normal limits  I-STAT ARTERIAL BLOOD GAS, ED - Abnormal; Notable for the following:    pO2, Arterial 74.0 (*)    Bicarbonate 29.0 (*)    Acid-Base Excess 3.0 (*)    All other components within normal limits  COMPREHENSIVE METABOLIC PANEL  URINALYSIS, ROUTINE W REFLEX MICROSCOPIC    EKG  EKG Interpretation None       Radiology No results found.  Procedures Procedures (including critical care time)  Medications Ordered in ED Medications  albuterol (PROVENTIL,VENTOLIN) solution continuous neb (not administered)  sodium chloride 0.9 % bolus 1,000 mL (1,000 mLs Intravenous New Bag/Given 11/18/16 1512)  methylPREDNISolone sodium succinate (SOLU-MEDROL) 125 mg/2 mL injection 125 mg (125 mg Intravenous Given 11/18/16 1512)     Initial Impression / Assessment and Plan / ED Course  I have reviewed the triage vital signs and the nursing notes.  Pertinent labs & imaging results that were available during my care of the patient were reviewed by me and considered in my medical decision making (see chart for details).     Patient is a 66 year old male presenting with shortness of breath. Patient has COPD, current smoker, presented with 2- 3 days of worsening shortness by the cough. Patient's been using inhaler every couple hours with no help. Patient is on 3 L of home oxygen.   3:29 PM Pneumonia versus COPD exacerbation. Patient has poor lung at baseline. We'll give steroids. Patiently initially hypoxic to 70% on his home 3 L. Bumped up to 10 L with much improvement. We'll start CAT.   Xray shows pna. Will treat broad spectrum abx given the degree of hypoxia on arrival.  4:20 PM CAT done. Patient looks MUCH improved. Will admit.   CRITICAL CARE Performed by: Gardiner Sleeper Total critical care time: 60 minutes Critical care time was exclusive of separately billable procedures and  treating other patients. Critical care was necessary to treat or prevent imminent or life-threatening deterioration. Critical care was time spent personally by me on the following activities: development of treatment plan with patient and/or surrogate as well as nursing, discussions with consultants, evaluation of patient's response to treatment, examination of patient, obtaining history from patient or surrogate, ordering and performing treatments and interventions, ordering and review of laboratory studies, ordering and review of radiographic studies, pulse oximetry and re-evaluation of patient's condition.   Final Clinical Impressions(s) / ED Diagnoses   Final diagnoses:  None    New Prescriptions New Prescriptions   No medications on file     Macarthur Critchley, MD 11/18/16 1620

## 2016-11-18 NOTE — Progress Notes (Signed)
Pt admitted to the unit at 1725. Pt mental status is A&O x 4. Pt oriented to room, staff, and call bell. Skin is intact except where otherwise charted. Full assessment charted in CH[facility]. Call bell within reach. Visitor guidelines reviewed w/ pt and/or family.  

## 2016-11-18 NOTE — ED Notes (Signed)
Date and time results received: 11/18/16  Test: Lactic Acid Critical Value: 2.35  Name of Provider Notified:  Dr. Thomasene Lot

## 2016-11-18 NOTE — ED Triage Notes (Signed)
To ED for increased sob and chills. Pt is on O2 3L [place] ATC. Productive cough has increased over the past couple of days. Also states he feels weak. Pt states he took tylenol before coming to the ED.

## 2016-11-18 NOTE — H&P (Signed)
Date: 11/18/2016               Patient Name:  Jay Mullins MRN: [id number]  DOB: [birth year] Age / Sex: 66 y.o., male   PCP: Chesley Noon, MD         Medical Service: Internal Medicine Teaching Service         Attending Physician: Dr. Lucious Groves, DO    First Contact: Dr. Tarri Abernethy Pager: [phone]  Second Contact: Dr. Posey Pronto Pager: [phone]       After Hours (After 5p/  First Contact Pager: [phone]  weekends / holidays): Second Contact Pager: [phone]   Chief Complaint: Shortness of Breath   History of Present Illness: Mr. Jay Mullins is a 66 y.o. Male with a PMHx significant for COPD on 3L/min [place] at home who presented to the ED with cough and worsening shortness of breath of 3 days duration. He states that initially his dyspnea was manageable but he has noticed over the past 2-3 days his oxygen requirement has increased, and his oxygen saturation has dropped. He typically has an oxygen saturation of around 90-95% but today it was at 77% and did not increase with increased oxygen. His inhalers initially helped but seem to have quit working. He has never had a COPD exacerbation or pneumonia before. Endorses fevers and chills with the highest temperature of 101.5. His cough is primarily dry but occasionally gets up clear sputum. Denies sick contact, diarrhea, N/V, myalgias, arthralgias, HA.   At baseline he is unable to do his ADLs without stopping to use his albuterol inhaler or sitting down to catch his breath. He acknowledges a 10-15lbs weight loss over the past 12 months.   ED Course: In the ED he was found to be hypoxic, with tachycardia and tachypnea. Initial labs were significant for an elevated lactic acid and leukocytosis with left shift. CXR illustrated RLL and RML consolidation. He was started on continuous albuterol and given Vanc/Zosyn.   Meds:  Current Meds  Medication Sig  . albuterol (PROVENTIL HFA;VENTOLIN HFA) 108 (90 Base) MCG/ACT inhaler Inhale 2 puffs into the lungs  every 6 (six) hours as needed for wheezing or shortness of breath.  . ALPRAZolam (XANAX) 1 MG tablet Take 1 mg by mouth See admin instructions. Take 1 tablet (1 mg) by mouth daily at bedtime, may also take 1/2-1 tablet (0.5 - 1 mg) during the Jay as needed for anxiety  . aspirin EC 81 MG tablet Take 81 mg by mouth daily.  . budesonide-formoterol (SYMBICORT) 160-4.5 MCG/ACT inhaler Inhale 1 puff into the lungs 2 (two) times daily.   . butalbital-acetaminophen-caffeine (FIORICET, ESGIC) 50-325-40 MG tablet Take 1-2 tablets by mouth every 6 (six) hours as needed for headache.  . Cholecalciferol (D 1000) 1000 units capsule Take 1,000 Units by mouth daily.   . colchicine 0.6 MG tablet Take 0.6 mg by mouth every 6 (six) hours as needed (gout flares). Mitigare  . escitalopram (LEXAPRO) 20 MG tablet Take 20 mg by mouth daily.  . finasteride (PROSCAR) 5 MG tablet Take 5 mg by mouth at bedtime.   Marland Kitchen ipratropium-albuterol (DUONEB) 0.5-2.5 (3) MG/3ML SOLN Inhale 3 mLs into the lungs every 6 (six) hours as needed (shortness of breath/wheezing).   Marland Kitchen lisinopril (PRINIVIL,ZESTRIL) 10 MG tablet Take 10 mg by mouth at bedtime.  . OXYGEN Inhale 3 L into the lungs continuous.  . pantoprazole (PROTONIX) 40 MG tablet Take 40 mg by mouth daily. Received from [facility]  .  pravastatin (PRAVACHOL) 20 MG tablet Take 20 mg by mouth at bedtime. Received from [facility]  . tamsulosin (FLOMAX) 0.4 MG CAPS capsule Take 0.4 mg by mouth daily. Received from [facility]   . Tiotropium Bromide Monohydrate (SPIRIVA RESPIMAT) 2.5 MCG/ACT AERS Inhale 1 puff into the lungs daily.   Allergies: Allergies as of 11/18/2016 - Review Complete 11/18/2016  Allergen Reaction Noted  . Ativan [lorazepam] Other (See Comments) 12/25/2014   Past Medical History:  Diagnosis Date  . AAA (abdominal aortic aneurysm) ([place])    infrarenal AAA  . Aortic aneurysm ([place])   . Arthritis   . Benign prostatic hyperplasia   . COPD (chronic  obstructive pulmonary disease) ([place])   . Depression   . Dyspnea    on exertion  . Headache   . Hypertension   . Myocardial infarction ([place]) 1980's  . On home oxygen therapy    "2l; 24/7 when I'm home" (07/27/2016)  . Panlobular emphysema ([place])   . Prostate disorder    Family History:  Mother: + Deceased at age 61, CAD Sisters: + COPD, deceased 78-70 y.o.  Social History:  Retired Economist  Married for 11 years  Smokes 1 PPD for >30 years  Denies current alcohol and illicit drug use  Review of Systems: A complete ROS was negative except as per HPI.   Physical Exam: Blood pressure 103/60, pulse (!) 112, temperature (S) 100 F (37.8 C), temperature source Oral, resp. rate (!) 31, weight 126 lb (57.2 kg), SpO2 94 %.  General: Thin male, nontoxic, resting comfortably on NRB Pulm: Diminished air movement, end expiratory wheezing, faint crackle RLL CV: Tachycardia with regular rhythm, no murmurs, no rubs  GI: Active bowel sounds, soft, non-distended, no tenderness to palpation  Extremities: No edema, pulses palpable in all extremities  Skin: Warm, dry  CXR: personally reviewed my interpretation is opacities is the RLL and RML, possible opacity in the LUL (unfortunately no prior x-rays to compare to)  Assessment & Plan by Problem: Active Problems:   Acute on chronic respiratory failure with hypoxia (HCC)  1. Acute on Chronic Respiratory Failure  - Presenting with dyspnea and cough  - Found be be hypoxic, tachypneic, and tachycardic  - Labs remarkable for elevated lactic acid of 2.35 and leukocytosis with left shift  - CXR illustrating consolidations/opacities in the right lower, right middle, and possibly left upper lobes. Unfortunately no prior CXR to compare to  - S/p 1 dose Vanc/Zosyn in the ED  - Will check urine antigens including Strep and Legionella - Scheduled DuoNebs and Dulera with PRN albuterol  - Change antibiotics to Ceftriaxone and Azythromycin    2. COPD - 3L/min [place] at baseline  - Unable to perform most ADLs without interruption  - Home inhalers include Albuterol, DuoNeb, Spiriva, and Symbicort - Continue to smoke 1 PPD, will provide nicotine patches while in hospital   3. Hypertension  - Home regimen: Lisinopril 40 mg QD - Hold for now due to low BP  4. Depression  - Continue Lexapro 20 mg QD and Xanax 1 mg QHS  Diet: Regular  VTE ppx: Lovenox  Code Status: Full   Dispo: Admit patient to Inpatient with expected length of stay greater than 2 midnights.  Signed: Ina Homes, MD 11/18/2016, 5:15 PM  My Pager: [phone]

## 2016-11-18 NOTE — ED Notes (Signed)
O2 placed on by NRB per Dr. Thomasene Lot--

## 2016-11-18 NOTE — Progress Notes (Signed)
Pharmacy Antibiotic Note  Jay Mullins is a 66 y.o. male admitted on 11/18/2016 with pneumonia.  Pharmacy has been consulted for vancomycin and zosyn dosing. WBC count is elevated at 19.5 and Tmax- 100. Scr slightly elevated at 1.15 with CrCl ~ 52 ml/min.  Plan: Zosyn 3.375 gm every 8 hours (EI) Vancomycin 1250 mg X 1 Then 500 mg every 12 hours Monitor renal function, cultures, and clinical s/sx of infection     Temp (24hrs), Avg:99.7 F (37.6 C), Min:99.4 F (37.4 C), Max:100 F (37.8 C)   Recent Labs Lab 11/18/16 1453 11/18/16 1507  WBC 19.5*  --   CREATININE 1.15  --   LATICACIDVEN  --  2.35*    CrCl cannot be calculated (Unknown ideal weight.).    Allergies  Allergen Reactions  . Ativan [Lorazepam] Other (See Comments)    hallucinations    Antimicrobials this admission: 9/15 Zosyn >> 9/15 Vanc >>    Thank you for allowing pharmacy to be a part of this patient's care.  Susa Raring, PharmD PGY2 Infectious Diseases Pharmacy Resident  Pager: [phone]  11/18/2016 3:50 PM

## 2016-11-19 ENCOUNTER — Encounter (HOSPITAL_COMMUNITY): Payer: Self-pay

## 2016-11-19 ENCOUNTER — Inpatient Hospital Stay (HOSPITAL_COMMUNITY): Payer: Medicare Other

## 2016-11-19 DIAGNOSIS — F329 Major depressive disorder, single episode, unspecified: Secondary | ICD-10-CM

## 2016-11-19 DIAGNOSIS — Z8249 Family history of ischemic heart disease and other diseases of the circulatory system: Secondary | ICD-10-CM

## 2016-11-19 DIAGNOSIS — R3129 Other microscopic hematuria: Secondary | ICD-10-CM

## 2016-11-19 DIAGNOSIS — Z888 Allergy status to other drugs, medicaments and biological substances status: Secondary | ICD-10-CM

## 2016-11-19 DIAGNOSIS — J189 Pneumonia, unspecified organism: Secondary | ICD-10-CM

## 2016-11-19 DIAGNOSIS — I10 Essential (primary) hypertension: Secondary | ICD-10-CM

## 2016-11-19 DIAGNOSIS — Z9981 Dependence on supplemental oxygen: Secondary | ICD-10-CM

## 2016-11-19 DIAGNOSIS — A419 Sepsis, unspecified organism: Principal | ICD-10-CM

## 2016-11-19 DIAGNOSIS — Z79899 Other long term (current) drug therapy: Secondary | ICD-10-CM

## 2016-11-19 DIAGNOSIS — Z836 Family history of other diseases of the respiratory system: Secondary | ICD-10-CM

## 2016-11-19 DIAGNOSIS — Z7951 Long term (current) use of inhaled steroids: Secondary | ICD-10-CM

## 2016-11-19 LAB — CBC
HCT: 35.9 % — ABNORMAL LOW (ref 39.0–52.0)
Hemoglobin: 11.5 g/dL — ABNORMAL LOW (ref 13.0–17.0)
MCH: 29 pg (ref 26.0–34.0)
MCHC: 32 g/dL (ref 30.0–36.0)
MCV: 90.4 fL (ref 78.0–100.0)
PLATELETS: 211 10*3/uL (ref 150–400)
RBC: 3.97 MIL/uL — AB (ref 4.22–5.81)
RDW: 15.8 % — AB (ref 11.5–15.5)
WBC: 13.7 10*3/uL — AB (ref 4.0–10.5)

## 2016-11-19 LAB — URINALYSIS, ROUTINE W REFLEX MICROSCOPIC
BILIRUBIN URINE: NEGATIVE
Glucose, UA: NEGATIVE mg/dL
Ketones, ur: NEGATIVE mg/dL
LEUKOCYTES UA: NEGATIVE
NITRITE: NEGATIVE
PH: 5 (ref 5.0–8.0)
Protein, ur: 100 mg/dL — AB
SPECIFIC GRAVITY, URINE: 1.032 — AB (ref 1.005–1.030)
SQUAMOUS EPITHELIAL / LPF: NONE SEEN

## 2016-11-19 LAB — BASIC METABOLIC PANEL
Anion gap: 4 — ABNORMAL LOW (ref 5–15)
BUN: 18 mg/dL (ref 6–20)
CHLORIDE: 104 mmol/L (ref 101–111)
CO2: 30 mmol/L (ref 22–32)
CREATININE: 0.75 mg/dL (ref 0.61–1.24)
Calcium: 8.2 mg/dL — ABNORMAL LOW (ref 8.9–10.3)
Glucose, Bld: 115 mg/dL — ABNORMAL HIGH (ref 65–99)
Potassium: 3.9 mmol/L (ref 3.5–5.1)
SODIUM: 138 mmol/L (ref 135–145)

## 2016-11-19 LAB — STREP PNEUMONIAE URINARY ANTIGEN: STREP PNEUMO URINARY ANTIGEN: NEGATIVE

## 2016-11-19 LAB — HIV ANTIBODY (ROUTINE TESTING W REFLEX): HIV SCREEN 4TH GENERATION: NONREACTIVE

## 2016-11-19 MED ORDER — IPRATROPIUM-ALBUTEROL 0.5-2.5 (3) MG/3ML IN SOLN
3.0000 mL | Freq: Three times a day (TID) | RESPIRATORY_TRACT | Status: DC
  Administered 2016-11-19 – 2016-11-20 (×2): 3 mL via RESPIRATORY_TRACT
  Filled 2016-11-19 (×3): qty 3

## 2016-11-19 MED ORDER — GUAIFENESIN 100 MG/5ML PO SOLN
5.0000 mL | ORAL | Status: DC | PRN
  Administered 2016-11-19 (×2): 100 mg via ORAL
  Filled 2016-11-19 (×2): qty 5

## 2016-11-19 MED ORDER — GUAIFENESIN-DM 100-10 MG/5ML PO SYRP
5.0000 mL | ORAL_SOLUTION | Freq: Once | ORAL | Status: DC
  Filled 2016-11-19: qty 5

## 2016-11-19 NOTE — Progress Notes (Signed)
   Subjective: Doing well this AM. Breathing is much improved over the interval. Discussed the plan to continue IV antibiotic and try to get him to his baseline home oxygen requirement (3L/min). Denies fevers, chills, cough. All questions and concerns answered.   Objective: Vital signs in last 24 hours: Vitals:   11/18/16 1929 11/18/16 1944 11/18/16 2115 11/19/16 0252  BP:   (!) 107/59   Pulse:   (!) 101   Resp:   (!) 21   Temp:   98.3 F (36.8 C)   TempSrc:   Oral   SpO2: (!) 83% (!) 78% 95% 95%  Weight:      Height:       General: Thin male resting comfortably on 6L/min [place]  Pulm: Diminished breath sounds, crackles in the left lower lung fields  CV: RRR, no murmurs, no rubs  GI: Active bowel sounds, soft, non-distended  Extremities: No edema   Assessment/Plan: Principal Problem:   Acute on chronic respiratory failure with hypoxia (HCC) Active Problems:   History of abdominal aortic aneurysm (AAA)   Current smoker   COPD mixed type ([place])  1. Acute on Chronic Respiratory Failure 2/2 Bacterial pneumonia  - Presenting with dyspnea and cough  - Found be be hypoxic, tachypneic, and tachycardic  - CXR illustrating consolidations/opacities in the right lower, right middle, and possibly left upper lobes. Unfortunately no prior CXR to compare to  - Leukocytosis improving  - Strep urine antigen negative  - Scheduled DuoNebs and Dulera with PRN albuterol  - Continue IV Ceftriaxone and Azythromycin  - Continue to wean oxygen to target 3L/min [place]  2. COPD - 3L/min [place] at baseline  - Unable to perform most ADLs without interruption  - Home inhalers include Albuterol, DuoNeb, Spiriva, and Symbicort - Continue to smoke 1 PPD, will provide nicotine patches while in hospital   3. Hypertension  - Home regimen: Lisinopril 40 mg QD - Continue to hold Lisinopril due to hypotension   4. Depression  - Continue Lexapro 20 mg QD and Xanax 1 mg QHS  5. Microscopic Hematuria  - Extensive  work-up in the past was unremarkable  - Follows with urology   6. Right Lower Lung Nodule  - Noted on CT angio on 08/30/16  - Will need outpatient work-up   Dispo: Anticipated discharge in approximately 1 day(s).   Ina Homes, MD 11/19/2016, 6:47 AM My Pager: [phone]

## 2016-11-19 NOTE — Progress Notes (Signed)
Transport came to get pt for X-R but pt wanted to get breathing treatment before the X-R respiratory paged came over to give breathing treatment, radiology unit informed about pt being ready to be picked up for X-RAY

## 2016-11-19 NOTE — Progress Notes (Signed)
Pt c/o SOB, found laughing and talking with other hospital staff in the room. Pt repeatedly is asking to use his inhaler like the one he has at home it is the most effective. The nebulizer treatments pt states "dont do me no good." After treatment patient is noted to be without distress and sitting up in bed watching television.

## 2016-11-19 NOTE — Progress Notes (Signed)
Pt asked for cough med, paged the on call form IMT[facility] put an order for guaifenesin syrup went to pt room twice still asleep will administer med when pt wakes up and needs it

## 2016-11-19 NOTE — Discharge Summary (Signed)
Name: Jay Mullins MRN: [id number] DOB: [birth year] 66 y.o. PCP: Jay Noon, MD  Date of Admission: 11/18/2016  2:36 PM Date of Discharge: 11/21/2016 Attending Physician: No att. providers found  Discharge Diagnosis: 1. Acute on Chronic Respiratory Failure secondary to Bacterial Pneumonia  2. COPD 3. Right Lower Lung Nodule  4. Microscopic Hematuria   Principal Problem:   Acute on chronic respiratory failure with hypoxia (HCC) Active Problems:   History of abdominal aortic aneurysm (AAA)   Current smoker   COPD mixed type [facility])  Discharge Medications: Allergies as of 11/20/2016      Reactions   Ativan [lorazepam] Other (See Comments)   hallucinations      Medication List    TAKE these medications   albuterol 108 (90 Base) MCG/ACT inhaler Commonly known as:  PROVENTIL HFA;VENTOLIN HFA Inhale 2 puffs into the lungs every 6 (six) hours as needed for wheezing or shortness of breath.   ALPRAZolam 1 MG tablet Commonly known as:  XANAX Take 1 mg by mouth See admin instructions. Take 1 tablet (1 mg) by mouth daily at bedtime, may also take 1/2-1 tablet (0.5 - 1 mg) during the day as needed for anxiety   amoxicillin-clavulanate 875-125 MG tablet Commonly known as:  AUGMENTIN Take 1 tablet by mouth every 12 (twelve) hours.   aspirin EC 81 MG tablet Take 81 mg by mouth daily.   azithromycin 500 MG tablet Commonly known as:  ZITHROMAX Take 1 tablet (500 mg total) by mouth daily.   butalbital-acetaminophen-caffeine 50-325-40 MG tablet Commonly known as:  FIORICET, ESGIC Take 1-2 tablets by mouth every 6 (six) hours as needed for headache.   colchicine 0.6 MG tablet Take 0.6 mg by mouth every 6 (six) hours as needed (gout flares). Mitigare   D 1000 1000 units capsule Generic drug:  Cholecalciferol Take 1,000 Units by mouth daily.   escitalopram 20 MG tablet Commonly known as:  LEXAPRO Take 20 mg by mouth daily.   finasteride 5 MG tablet Commonly known as:   PROSCAR Take 5 mg by mouth at bedtime.   ipratropium-albuterol 0.5-2.5 (3) MG/3ML Soln Commonly known as:  DUONEB Inhale 3 mLs into the lungs every 6 (six) hours as needed (shortness of breath/wheezing).   lisinopril 10 MG tablet Commonly known as:  PRINIVIL,ZESTRIL Take 10 mg by mouth at bedtime.   oxyCODONE-acetaminophen 5-325 MG tablet Commonly known as:  PERCOCET/ROXICET Take 1 tablet by mouth every 6 (six) hours as needed for moderate pain.   OXYGEN Inhale 3 L into the lungs continuous.   pantoprazole 40 MG tablet Commonly known as:  PROTONIX Take 40 mg by mouth daily. Received from [place]   pravastatin 20 MG tablet Commonly known as:  PRAVACHOL Take 20 mg by mouth at bedtime. Received from [facility]   predniSONE 20 MG tablet Commonly known as:  DELTASONE Take 2 tablets (40 mg total) by mouth daily with breakfast.   SPIRIVA RESPIMAT 2.5 MCG/ACT Aers Generic drug:  Tiotropium Bromide Monohydrate Inhale 1 puff into the lungs daily.   SYMBICORT 160-4.5 MCG/ACT inhaler Generic drug:  budesonide-formoterol Inhale 1 puff into the lungs 2 (two) times daily.   tamsulosin 0.4 MG Caps capsule Commonly known as:  FLOMAX Take 0.4 mg by mouth daily. Received from [facility] Instructions        Start     Ordered   11/21/16 0000  amoxicillin-clavulanate (AUGMENTIN) 875-125 MG  tablet  Every 12 hours     11/20/16 1718   11/21/16 0000  azithromycin (ZITHROMAX) 500 MG tablet  Daily     11/20/16 1718   11/21/16 0000  predniSONE (DELTASONE) 20 MG tablet  Daily with breakfast     11/20/16 1718   11/20/16 0000  Increase activity slowly     11/20/16 1718   11/20/16 0000  Diet - low sodium heart healthy     11/20/16 1718   11/20/16 0000  Amb Referral to Palliative Care    Comments:  End stage COPD   11/20/16 1718   11/20/16 0000  AMB referral to pulmonary rehabilitation    Question Answer Comment  Please select a program:  Pulmonary Rehabilitation (COPD)   COPD Diagnosis: (See requirements below) COPD-Gold 3   Program Prescription: O2 Administration by RT, EP, or RN if SpO2<88%      11/20/16 1718     Disposition and follow-up:   [name] was discharged from [facility] in Stable condition.  At the hospital follow up visit please address:  1.  COPD. Please ensure palliative care has reached out to him. PNA. Ensure he has finished his course of antibiotics. Right Lung Nodule. Consider further evaluation with CT Chest. Microscopic Hematuria. Continued follow-up with urology.   2.  Labs / imaging needed at time of follow-up: Consider a CT chest to evaluate right lower lobe nodule appreciate on Abdominal CT Angio on 08/30/16.   3.  Pending labs/ test needing follow-up: None  Follow-up Appointments: Follow-up Information    Jay Noon, MD Follow up.   Specialty:  Family Medicine Contact information: [place] [address] [phone]          Hospital Course by problem list: Principal Problem:   Acute on chronic respiratory failure with hypoxia [facility]) Active Problems:   History of abdominal aortic aneurysm (AAA)   Current smoker   COPD mixed type ([place])   1. Acute on Chronic Respiratory Failure secondary to Bacterial Pneumonia. Mr. Jay Mullins is a 66 y.o. Male with a PMHx significant for COPD on 3L/min [place] at home who presented to the ED with cough and worsening shortness of breath of 3 days duration. In the ED he was found to be hypoxic, with tachycardia and tachypnea. Initial labs were significant for an elevated lactic acid and leukocytosis with left shift. CXR illustrated RLL and RML consolidation. He was started on continuous albuterol and given Vanc/Zosyn. On admission his antibiotics were switched to Ceftriaxone and Azythromycin and he received scheduled breathing treatments. Over the next 48 hours his breathing improved significantly, retuning to his  baseline oxygen requirement of 3L/min. He was discharged in stable condition on Augmentin 875-125 mg BID for 2 days, Azithromycin 500 mg QD for 2 days, and Prednisone 40 mg QD for 3 days.   2. COPD. Mr. Jay Mullins has severe limiting COPD. He is on chronic oxygen at 3L/min and states that at baseline he is unable to do his ADLs without stopping to use his albuterol inhaler or sitting down to catch his breath. He acknowledges a 10-15lbs weight loss over the past 12 months. Both of these factors are poor prognostic indicators. I recommend having a goals of care discussion with the patient and ensuring he has the proper paperwork in place. We did put an outpatient order for palliative care.   3. Right Lower Lung Nodule. Reviewing prior imaging, including an abdominal CT angio done on 08/30/16 shows that  Mr. Jay Mullins has a nodule in his right lower lung. Further evaluation should be pursued given his extensive smoking history and normocytic anemia. I recommend outpatient Chest CT to better evaluate the lesion.   4. Microscopic Hematuria. Mr. Jay Mullins had an abnormal UA illustrating numerous RBCs. Review of records indicate that this is a chronic issue and that he is following with a urologist. Prior hematuria work-up appears to have been benign in 2016. Given his extensive smoking history, age, and race we recommend continued outpatient follow-up with his urologist.   Discharge Vitals:   BP 139/89   Pulse (!) 129 Comment: highest while ambulating with PT  Temp 98.6 F (37 C)   Resp 20   Ht 5\' 9"  (1.753 m)   Wt 126 lb (57.2 kg)   SpO2 (!) 84% Comment: lowest while ambulating with PT  BMI 18.61 kg/m   Pertinent Labs, Studies, and Procedures:  Chest X-Ray IMPRESSION: 1. 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 Followup PA and lateral chest X-ray is recommended in 3-4 weeks following trial of antibiotic therapy to ensure resolution and exclude underlying  malignancy. 2. Suspect pulmonary arterial hypertension.  Discharge Instructions: Discharge Instructions    AMB referral to pulmonary rehabilitation    Complete by:  As directed    Please select a program:  Pulmonary Rehabilitation (COPD)   COPD Diagnosis: (See requirements below):  COPD-Gold 3   Program Prescription:  O2 Administration by RT, EP, or RN if SpO2<88%   Amb Referral to Palliative Care    Complete by:  As directed    End stage COPD   Diet - low sodium heart healthy    Complete by:  As directed    Increase activity slowly    Complete by:  As directed      Signed: Ina Homes, MD 11/21/2016, 6:52 AM   My Pager: [phone]

## 2016-11-20 ENCOUNTER — Ambulatory Visit: Payer: Medicare Other | Admitting: Internal Medicine

## 2016-11-20 DIAGNOSIS — J9621 Acute and chronic respiratory failure with hypoxia: Secondary | ICD-10-CM

## 2016-11-20 DIAGNOSIS — J159 Unspecified bacterial pneumonia: Secondary | ICD-10-CM

## 2016-11-20 DIAGNOSIS — J449 Chronic obstructive pulmonary disease, unspecified: Secondary | ICD-10-CM

## 2016-11-20 DIAGNOSIS — F17210 Nicotine dependence, cigarettes, uncomplicated: Secondary | ICD-10-CM

## 2016-11-20 LAB — BASIC METABOLIC PANEL
Anion gap: 7 (ref 5–15)
BUN: 17 mg/dL (ref 6–20)
CO2: 29 mmol/L (ref 22–32)
CREATININE: 0.85 mg/dL (ref 0.61–1.24)
Calcium: 8.2 mg/dL — ABNORMAL LOW (ref 8.9–10.3)
Chloride: 102 mmol/L (ref 101–111)
GFR calc Af Amer: >60 mL/min (ref >60)
Glucose, Bld: 89 mg/dL (ref 65–99)
POTASSIUM: 3.6 mmol/L (ref 3.5–5.1)
SODIUM: 138 mmol/L (ref 135–145)

## 2016-11-20 LAB — CBC
HCT: 37 % — ABNORMAL LOW (ref 39.0–52.0)
Hemoglobin: 12.1 g/dL — ABNORMAL LOW (ref 13.0–17.0)
MCH: 29.4 pg (ref 26.0–34.0)
MCHC: 32.7 g/dL (ref 30.0–36.0)
MCV: 90 fL (ref 78.0–100.0)
PLATELETS: 204 10*3/uL (ref 150–400)
RBC: 4.11 MIL/uL — ABNORMAL LOW (ref 4.22–5.81)
RDW: 15.9 % — AB (ref 11.5–15.5)
WBC: 11.4 10*3/uL — AB (ref 4.0–10.5)

## 2016-11-20 LAB — LEGIONELLA PNEUMOPHILA SEROGP 1 UR AG: L. pneumophila Serogp 1 Ur Ag: NEGATIVE

## 2016-11-20 MED ORDER — AZITHROMYCIN 500 MG PO TABS: 500.0000 mg | ORAL_TABLET | Freq: Every day | ORAL | 0 refills | Status: AC

## 2016-11-20 MED ORDER — ALPRAZOLAM 0.5 MG PO TABS
1.0000 mg | ORAL_TABLET | Freq: Once | ORAL | Status: AC
  Administered 2016-11-20: 1 mg via ORAL

## 2016-11-20 MED ORDER — PREDNISONE 20 MG PO TABS: 40.0000 mg | ORAL_TABLET | Freq: Every day | ORAL | 0 refills | Status: AC

## 2016-11-20 MED ORDER — PREDNISONE 20 MG PO TABS
40.0000 mg | ORAL_TABLET | Freq: Every day | ORAL | Status: DC
  Administered 2016-11-20: 40 mg via ORAL
  Filled 2016-11-20: qty 2

## 2016-11-20 MED ORDER — ENSURE ENLIVE PO LIQD: 237.0000 mL | Freq: Two times a day (BID) | ORAL | Status: DC

## 2016-11-20 MED ORDER — AMOXICILLIN-POT CLAVULANATE 875-125 MG PO TABS: 1.0000 | ORAL_TABLET | Freq: Two times a day (BID) | ORAL | 0 refills | Status: AC

## 2016-11-20 MED ORDER — AZITHROMYCIN 500 MG PO TABS
500.0000 mg | ORAL_TABLET | Freq: Every day | ORAL | Status: DC
  Administered 2016-11-20: 500 mg via ORAL
  Filled 2016-11-20: qty 1

## 2016-11-20 MED ORDER — AMOXICILLIN-POT CLAVULANATE 875-125 MG PO TABS: 1.0000 | ORAL_TABLET | Freq: Two times a day (BID) | ORAL | Status: DC

## 2016-11-20 NOTE — Procedures (Signed)
Responded to RN's call about pt being in distress. Upon arrival pt stated that he was having some trouble breathing, slight used of accessory muscles noted. RN placed pt on 10L HFNC and RT went to obtain CPAP.  After obtaining CPAP, pt states that he is breathing fine with the HFNC and WOB has decreased.  CPAP placed on standby. RT will continue to monitor pt and will place pt on CPAP if increase WOB returns. RN notified.

## 2016-11-20 NOTE — Progress Notes (Signed)
  Date: 11/20/2016  Patient name: Sephiroth Mcluckie  Medical record number: [id number]  Date of birth: 1950-06-29   I have seen and evaluated this patient and I have discussed the plan of care with the house staff. Please see their note for complete details. I concur with their findings with the following additions/corrections: Mr Wolz was seen on AM rounds. He appeared frail and chronically ill. He has slight resp distress / use of accessory muscles and muscle wasting. Breath sounds could not be appreciated. He was successfully placed on home 3L O2 by [place]. Medically stable for D/C home.  Bartholomew Crews, MD 11/20/2016, 3:09 PM

## 2016-11-20 NOTE — Evaluation (Signed)
Physical Therapy Evaluation Patient Details Name: Jay Mullins MRN: [id number] DOB: [birth year] Today's Date: 11/20/2016   History of Present Illness  66 y.o. male admitted on 11/18/16 for SOB dx with acute on chronic repiratory failure secondary to sepsis due to bacterial PNA.  Pt with significant PMH of MI, HTN, HA, COPD, aortic aneurysm, brain surgery, and abdominal aortic stent graft.  Pt is on 3 L O2 [place] at home.  Clinical Impression  Pt was able to mobilize well without any obvious balance or strength deficits.  He, does, however, have very poor pulmonary endurance.  His O2 sats decreased to 84% on 3 L O2 [place] with just 49' of gait with DOE 3/4.  Hr 100s-129 bpm during gait.  He did recover quickly once seated to 91% on 3 L O2 [place] (which is where he was at rest before we started).  We discussed at length energy conservation techniques and that using his Rollator to help with energy, but to also provide him with a place to sit when he does get SOB would help in the interim before he got fully back to his normal.  He would benefit from OP Pulmonary rehab at discharge.  PT to sign off as he is mobilizing independently.      Follow Up Recommendations Other (comment) (OP Pulmonary rehab)    Equipment Recommendations  Other (comment) (I recommended he use his rollator at home.  )    Recommendations for Other Services   NA    Precautions / Restrictions Precautions Precautions: Other (comment) Precaution Comments: monitor O2 during mobility.       Mobility  Bed Mobility Overal bed mobility: Modified Independent                Transfers Overall transfer level: Modified independent                  Ambulation/Gait Ambulation/Gait assistance: Modified independent (Device/Increase time) Ambulation Distance (Feet): 55 Feet Assistive device: None Gait Pattern/deviations: WFL(Within Functional Limits)   Gait velocity interpretation: at or above normal speed for  age/gender General Gait Details: Pt walks fast, likely to get where he is going to sit down sooner.          Balance Overall balance assessment: No apparent balance deficits (not formally assessed)                                           Pertinent Vitals/Pain Pain Assessment: No/denies pain    Home Living Family/patient expects to be discharged to:: Private residence Living Arrangements: Spouse/significant other;Children (wife, daughter, son in Sports coach and grandson) Available Help at Discharge: Family;Available 24 hours/day Type of Home: House Home Access: Stairs to enter   [employer] of Steps: 2 Home Layout: One level Home Equipment: Walker - 4 wheels;Other (comment) Additional Comments: home O2, and home pulse ox reader    Prior Function Level of Independence: Independent         Comments: Pt is a limited ambulator, uses O2, rides the electric cart at the grocery store, limted home ambulator before he has to sit down and rest.      Hand Dominance        Extremity/Trunk Assessment   Upper Extremity Assessment Upper Extremity Assessment: Overall WFL for tasks assessed    Lower Extremity Assessment Lower Extremity Assessment: Overall WFL for tasks assessed    Cervical /  Trunk Assessment Cervical / Trunk Assessment: Kyphotic  Communication   Communication: No difficulties  Cognition Arousal/Alertness: Awake/alert Behavior During Therapy: WFL for tasks assessed/performed Overall Cognitive Status: Within Functional Limits for tasks assessed                                 General Comments: Not specifically tested, but appears WNL       General Comments General comments (skin integrity, edema, etc.): Pt with limited gait distance due to increasing DOE 3/4 with gait with O2 sats dropping to 84% on 3 L O2 [place].  Rebounded back to 91% after 2 mins of seated rest.  Pt HR 100s-129 during gait.  Educated pt on using his  rollator for energy conservation and as a way to have a seat to sit down on to rest when he gets into distress.  I also advised him on doing things like sitting to toilet, sitting to brush his teeth.         Assessment/Plan    PT Assessment Patent does not need any further PT services  PT Problem List Decreased activity tolerance;Cardiopulmonary status limiting activity           PT Goals (Current goals can be found in the Care Plan section)  Acute Rehab PT Goals Patient Stated Goal: to go home, get back to his "normal" PT Goal Formulation: All assessment and education complete, DC therapy               AM-PAC PT "6 Clicks" Daily Activity  Outcome Measure Difficulty turning over in bed (including adjusting bedclothes, sheets and blankets)?: None Difficulty moving from lying on back to sitting on the side of the bed? : None Difficulty sitting down on and standing up from a chair with arms (e.g., wheelchair, bedside commode, etc,.)?: None Help needed moving to and from a bed to chair (including a wheelchair)?: None Help needed walking in hospital room?: None Help needed climbing 3-5 steps with a railing? : None 6 Click Score: 24    End of Session Equipment Utilized During Treatment: Oxygen Activity Tolerance: Other (comment) (limited by DOE) Patient left: in bed;with call bell/phone within reach;with family/visitor present Nurse Communication: Mobility status PT Visit Diagnosis: Other (comment) (decreased activity tolerance.  )    Time: 2633-3545 PT Time Calculation (min) (ACUTE ONLY): 30 min   Charges:          Wells Guiles B. Zakar Brosch, PT, DPT [phone]   PT Evaluation $PT Eval Moderate Complexity: 1 Mod PT Treatments $Self Care/Home Management: 8-22      11/20/2016, 5:26 PM

## 2016-11-20 NOTE — Progress Notes (Signed)
   Subjective: Pts was resting comfortably this AM. Overnight, pt complained of increased dyspnea at rest, sat in the 80s; he was noted to have accessory muscle use, pt was put on 10 L HFNC. Respiratory therapy was called for possible CPAP. This morning we titrated his flow to 5L/min and then 3L/min at which pt was sating at 97 baseline. Pt asked for his home Xanax; informed pt to ask his nurse when needed. Denies fever, chills and cough.   Objective: Vital signs in last 24 hours: Vitals:   11/19/16 1623 11/19/16 2114 11/19/16 2133 11/20/16 0515  BP: 138/82 (!) 125/95  127/80  Pulse: 91 (!) 108  99  Resp:  19  (!) 1  Temp: 97.6 F (36.4 C) 98.7 F (37.1 C)  98.7 F (37.1 C)  TempSrc: Oral Oral  Oral  SpO2: 93% 90% 91% 99%  Weight:      Height:       General: Thin male resting comfortably on 5L/min [place]  Pulm: Improved breath sound b/l and improving crackles in the left lower lung fields  CV: RRR, no murmurs, no rubs  GI: Active bowel sounds, soft, non-distended  Extremities: No edema   Assessment/Plan: Principal Problem:   Acute on chronic respiratory failure with hypoxia (HCC) Active Problems:   History of abdominal aortic aneurysm (AAA)   Current smoker   COPD mixed type (HCC)  1. Acute on Chronic Respiratory Failure 2/2 Bacterial PNA  -Possible d/c today: Pt at 3L/min [place] at 97% which is his baseline  -On admission labs were remarkable for lactic acid of 2.3 and leukocytosis. WBC trending down. 11 (09/17)<13.7 (09/16)<19.5 (09/15).  -CXR on 09/16 shows decreased airspace/interstitial opacities within the mid and lower lungs bilaterally. Very small bilateral pleural effusions. Severe emphysema and evidence of pulmonary arterial hypertension.  - Continue DuoNebs and Dulera with PRN albuterol  - Pt Abx will be changed from Ceftriaxone 1 g IV Q24H and Azythromycin 500 mg IV Q24H (Day2) to PO Azythromycin 500 mg and Augmentin 875 - 125 Mg  -Urine Strep pneumoniae- negative  - F/U  urine antigens Legionella    2. COPD  - Start PO Prednisone 40 Mg for five days.  - Consult PT/OT - Pt is unable to perform most ADLs without interruption  - Consult palliative care - was discussed with pt and he agrees  - 3L/min [place] at baseline  - Home inhalers include Albuterol, DuoNeb, Spiriva, and Symbicort  - Continue to smoke 1 PPD, will provide nicotine patches while in hospital    3. Hypertension  - Home regimen: Lisinopril 40 mg QD  - Continue to hold for now due to low BP.    4. Depression  - Continue Lexapro 20 mg QD and Xanax 1 mg QHS   5. Microscopic Hematuria  - Extensive work-up in the past was unremarkable  - Follows with urology   6. Right Lower Lung Nodule  - Noted on CT angio on 08/30/16  - Will need outpatient work-up   Ina Homes, MD 11/20/2016, 6:42 AM My Pager: [phone]

## 2016-11-20 NOTE — Discharge Instructions (Signed)
Please continue to take your antibiotics.   Please quit smoking.  Continue to use your inhalers as prescribed.  Community-Acquired Pneumonia, Adult Pneumonia is an infection of the lungs. One type of pneumonia can happen while a person is in a hospital. A different type can happen when a person is not in a hospital (community-acquired pneumonia). It is easy for this kind to spread from person to person. It can spread to you if you breathe near an infected person who coughs or sneezes. Some symptoms include:  A dry cough.  A wet (productive) cough.  Fever.  Sweating.  Chest pain.  Follow these instructions at home:  Take over-the-counter and prescription medicines only as told by your doctor. ? Only take cough medicine if you are losing sleep. ? If you were prescribed an antibiotic medicine, take it as told by your doctor. Do not stop taking the antibiotic even if you start to feel better.  Sleep with your head and neck raised (elevated). You can do this by putting a few pillows under your head, or you can sleep in a recliner.  Do not use tobacco products. These include cigarettes, chewing tobacco, and e-cigarettes. If you need help quitting, ask your doctor.  Drink enough water to keep your pee (urine) clear or pale yellow. A shot (vaccine) can help prevent pneumonia. Shots are often suggested for:  People older than 65 years of age.  People older than 65 years of age: ? Who are having cancer treatment. ? Who have long-term (chronic) lung disease. ? Who have problems with their body's defense system (immune system).  You may also prevent pneumonia if you take these actions:  Get the flu (influenza) shot every year.  Go to the dentist as often as told.  Wash your hands often. If soap and water are not available, use hand sanitizer.  Contact a doctor if:  You have a fever.  You lose sleep because your cough medicine does not help. Get help right away if:  You are  short of breath and it gets worse.  You have more chest pain.  Your sickness gets worse. This is very serious if: ? You are an older adult. ? Your body's defense system is weak.  You cough up blood. This information is not intended to replace advice given to you by your health care provider. Make sure you discuss any questions you have with your health care provider. Document Released: 08/09/2007 Document Revised: 07/29/2015 Document Reviewed: 06/17/2014 Elsevier Interactive Patient Education  [employer].

## 2016-11-21 ENCOUNTER — Encounter: Payer: Self-pay | Admitting: Internal Medicine

## 2016-11-27 ENCOUNTER — Ambulatory Visit: Payer: Medicare Other | Admitting: Internal Medicine

## 2016-12-06 NOTE — Progress Notes (Signed)
Cardiology Office Note   Date:  12/07/2016   ID:  Jay Mullins, DOB [birth year], MRN [id number]  PCP:  Chesley Noon, MD  Cardiologist:   Dorris Carnes, MD   F/U of HTN      History of Present Illness: Jay Mullins is a 66 y.o. male with a history of HTN, HL, COPD, AAA and bilateral iliac aneurysms   She saw Jay Mullins in May as preop eval before AAA Pt reported history of mild heart attack 30 years prior Rx with PTCA  Lexiscan myovue ordered  This showed a large inferior defect consistent with soft tissue attenuation  Mild ischemia at basal septum  LVEF 53%  Since he was in clinic last he had AAA repair in spring    Called in in July  BP was high  Told to go lo local ED  Admitted for pneumonia this fall  Breathing is better now   Wearing O2    Appetite good  But losing weight  Doesn't understand why   Deneis CP       Current Meds  Medication Sig  . albuterol (PROVENTIL HFA;VENTOLIN HFA) 108 (90 Base) MCG/ACT inhaler Inhale 2 puffs into the lungs every 6 (six) hours as needed for wheezing or shortness of breath.  . ALPRAZolam (XANAX) 1 MG tablet Take 1 mg by mouth See admin instructions. Take 1 tablet (1 mg) by mouth daily at bedtime, may also take 1/2-1 tablet (0.5 - 1 mg) during the day as needed for anxiety  . aspirin EC 81 MG tablet Take 81 mg by mouth daily.  . budesonide-formoterol (SYMBICORT) 160-4.5 MCG/ACT inhaler Inhale 1 puff into the lungs 2 (two) times daily.   . butalbital-acetaminophen-caffeine (FIORICET, ESGIC) 50-325-40 MG tablet Take 1-2 tablets by mouth every 6 (six) hours as needed for headache.  . Cholecalciferol (D 1000) 1000 units capsule Take 1,000 Units by mouth daily.   . colchicine 0.6 MG tablet Take 0.6 mg by mouth every 6 (six) hours as needed (gout flares). Mitigare  . escitalopram (LEXAPRO) 20 MG tablet Take 20 mg by mouth daily.  . finasteride (PROSCAR) 5 MG tablet Take 5 mg by mouth at bedtime.   Marland Kitchen ipratropium-albuterol (DUONEB) 0.5-2.5 (3)  MG/3ML SOLN Inhale 3 mLs into the lungs every 6 (six) hours as needed (shortness of breath/wheezing).   Marland Kitchen lisinopril (PRINIVIL,ZESTRIL) 10 MG tablet Take 10 mg by mouth at bedtime.  . OXYGEN Inhale 3 L into the lungs continuous.  . pantoprazole (PROTONIX) 40 MG tablet Take 40 mg by mouth daily. Received from [facility]  . pravastatin (PRAVACHOL) 20 MG tablet Take 20 mg by mouth at bedtime. Received from [facility]  . tamsulosin (FLOMAX) 0.4 MG CAPS capsule Take 0.4 mg by mouth daily. Received from [facility]   . Tiotropium Bromide Monohydrate (SPIRIVA RESPIMAT) 2.5 MCG/ACT AERS Inhale 1 puff into the lungs daily.     Allergies:   Ativan [lorazepam]   Past Medical History:  Diagnosis Date  . AAA (abdominal aortic aneurysm) ([place])    infrarenal AAA  . Aortic aneurysm ([place])   . Arthritis   . Benign prostatic hyperplasia   . COPD (chronic obstructive pulmonary disease) ([place])   . Depression   . Dyspnea    on exertion  . Headache   . Hypertension   . Myocardial infarction ([place]) 1980's  . On home oxygen therapy    "2l; 24/7 when I'm home" (07/27/2016)  . Panlobular emphysema ([place])   . Prostate  disorder     Past Surgical History:  Procedure Laterality Date  . ABDOMINAL AORTIC ENDOVASCULAR STENT GRAFT N/A 07/27/2016   Procedure: ABDOMINAL AORTIC ENDOVASCULAR STENT GRAFT;  Surgeon: Angelia Mould, MD;  Location: [facility];  Service: Vascular;  Laterality: N/A;  . BRAIN SURGERY  Jul 14, 2013   Mass front Left side  . CARDIAC CATHETERIZATION  1980's  . LAPAROTOMY  1970's   ulcer ruptured     Social History:  The patient  reports that he has been smoking Cigarettes.  He has a 40.00 pack-year smoking history. He has quit using smokeless tobacco. His smokeless tobacco use included Snuff and Chew. He reports that he does not drink alcohol or use drugs.   Family History:  The patient's family history includes COPD in his sister.    ROS:  Please see the history of present  illness. All other systems are reviewed and  Negative to the above problem except as noted.    PHYSICAL EXAM: VS:  BP 114/62   Pulse 73   Ht 5\' 9"  (1.753 m)   Wt 123 lb 3.2 oz (55.9 kg)   SpO2 98%   BMI 18.19 kg/m   GEN: Thin 66 yo in no acute distress    On oxygen   HEENT: normal  Neck: no JVD, carotid bruits, or masses Cardiac: RRR; no murmurs, rubs, or gallops,no edema  Respiratory:  Decreased airflow  Rhonchi   GI: soft, nontender, nondistended, + BS  No hepatomegaly  MS: no deformity Moving all extremities   Skin: warm and dry, no rash Neuro:  Strength and sensation are intact Psych: euthymic mood, full affect   EKG:  EKG is not ordered today.   Lipid Panel No results found for: CHOL, TRIG, HDL, CHOLHDL, VLDL, LDLCALC, LDLDIRECT    Wt Readings from Last 3 Encounters:  12/07/16 123 lb 3.2 oz (55.9 kg)  11/18/16 126 lb (57.2 kg)  08/30/16 126 lb (57.2 kg)      ASSESSMENT AND PLAN:  1  HTN  BP is good today  Keep on current meds Check BMET  2  HL  Check lpds    3  COPD  Severe   May explain some of wt loss    4  Abnormal CT   Will set up for PET cT  5  Wt loss   Appetite good  Losing wt  Will check albumin  Get PETCT    Current medicines are reviewed at length with the patient today.  The patient does not have concerns regarding medicines.  Signed, Dorris Carnes, MD  12/07/2016 11:59 AM    [address], [place], [place]  [address] Phone: [phone]; Fax: [phone]

## 2016-12-07 ENCOUNTER — Ambulatory Visit (INDEPENDENT_AMBULATORY_CARE_PROVIDER_SITE_OTHER): Payer: Medicare Other | Admitting: Internal Medicine

## 2016-12-07 ENCOUNTER — Encounter: Payer: Self-pay | Admitting: Internal Medicine

## 2016-12-07 VITALS — BP 114/62 | HR 73 | Ht 69.0 in | Wt 123.2 lb

## 2016-12-07 DIAGNOSIS — Z23 Encounter for immunization: Secondary | ICD-10-CM

## 2016-12-07 DIAGNOSIS — E782 Mixed hyperlipidemia: Secondary | ICD-10-CM | POA: Diagnosis not present

## 2016-12-07 DIAGNOSIS — I2510 Atherosclerotic heart disease of native coronary artery without angina pectoris: Secondary | ICD-10-CM | POA: Diagnosis not present

## 2016-12-07 DIAGNOSIS — J449 Chronic obstructive pulmonary disease, unspecified: Secondary | ICD-10-CM | POA: Diagnosis not present

## 2016-12-07 DIAGNOSIS — I10 Essential (primary) hypertension: Secondary | ICD-10-CM

## 2016-12-07 LAB — COMPREHENSIVE METABOLIC PANEL
A/G RATIO: 1.7 (ref 1.2–2.2)
ALT: 7 IU/L (ref 0–44)
AST: 13 IU/L (ref 0–40)
Albumin: 4.1 g/dL (ref 3.6–4.8)
Alkaline Phosphatase: 79 IU/L (ref 39–117)
BILIRUBIN TOTAL: 0.2 mg/dL (ref 0.0–1.2)
BUN/Creatinine Ratio: 23 (ref 10–24)
BUN: 18 mg/dL (ref 8–27)
CO2: 29 mmol/L (ref 20–29)
Calcium: 9.3 mg/dL (ref 8.6–10.2)
Chloride: 97 mmol/L (ref 96–106)
Creatinine, Ser: 0.8 mg/dL (ref 0.76–1.27)
GFR calc Af Amer: 108 mL/min/{1.73_m2} (ref >59)
GFR calc non Af Amer: 94 mL/min/{1.73_m2} (ref >59)
Globulin, Total: 2.4 g/dL (ref 1.5–4.5)
Glucose: 94 mg/dL (ref 65–99)
POTASSIUM: 4.7 mmol/L (ref 3.5–5.2)
Sodium: 141 mmol/L (ref 134–144)
Total Protein: 6.5 g/dL (ref 6.0–8.5)

## 2016-12-07 LAB — LIPID PANEL
CHOL/HDL RATIO: 2.3 ratio (ref 0.0–5.0)
Cholesterol, Total: 142 mg/dL (ref 100–199)
HDL: 62 mg/dL (ref >39)
LDL CALC: 66 mg/dL (ref 0–99)
TRIGLYCERIDES: 68 mg/dL (ref 0–149)
VLDL Cholesterol Cal: 14 mg/dL (ref 5–40)

## 2016-12-07 MED ORDER — PRAVASTATIN SODIUM 20 MG PO TABS: 20.0000 mg | ORAL_TABLET | Freq: Every day | ORAL | 3 refills | Status: DC

## 2016-12-07 NOTE — Patient Instructions (Addendum)
Your physician recommends that you continue on your current medications as directed. Please refer to the Current Medication list given to you today.  Your physician recommends that you return for lab work in: today (CMET, LIPIDS)  Non-Cardiac CT scanning, (CAT scanning), is a noninvasive, special x-ray that produces cross-sectional images of the body using x-rays and a computer. CT scans help physicians diagnose and treat medical conditions. For some CT exams, a contrast material is used to enhance visibility in the area of the body being studied. CT scans provide greater clarity and reveal more details than regular x-ray exams.  Dr. Harrington Challenger is planning to order a PET CT as was recommended by radiology.  We will contact you with further information.  Your physician wants you to follow-up in: April, 2019 with Dr. Harrington Challenger.  You will receive a reminder letter in the mail two months in advance. If you don't receive a letter, please call our office to schedule the follow-up appointment.

## 2016-12-12 ENCOUNTER — Telehealth: Payer: Self-pay | Admitting: *Deleted

## 2016-12-12 DIAGNOSIS — R911 Solitary pulmonary nodule: Secondary | ICD-10-CM

## 2016-12-12 DIAGNOSIS — R634 Abnormal weight loss: Secondary | ICD-10-CM

## 2016-12-12 NOTE — Telephone Encounter (Signed)
Notified patient's significant other, [name], DPR, of normal lab results and that PET CT has been ordered and sent to pre cert.  Advised test will be done at [facility] and that he is to be NPO for 6 hours prior to test.  Advised he will be called to schedule once it goes through pre cert.

## 2016-12-14 ENCOUNTER — Telehealth (HOSPITAL_COMMUNITY): Payer: Self-pay

## 2016-12-14 NOTE — Telephone Encounter (Signed)
Reviewed order, there is no PFT with the DX. Called Dr.Saraiya with Internal Medicine to request a new DX.

## 2016-12-26 ENCOUNTER — Ambulatory Visit (HOSPITAL_COMMUNITY)
Admission: RE | Admit: 2016-12-26 | Discharge: 2016-12-26 | Disposition: A | Discharge status: Home / Self Care | Payer: Medicare Other | Source: Ambulatory Visit | Attending: Internal Medicine | Admitting: Internal Medicine

## 2016-12-26 DIAGNOSIS — R634 Abnormal weight loss: Secondary | ICD-10-CM | POA: Diagnosis present

## 2016-12-26 DIAGNOSIS — J432 Centrilobular emphysema: Secondary | ICD-10-CM | POA: Insufficient documentation

## 2016-12-26 DIAGNOSIS — R911 Solitary pulmonary nodule: Secondary | ICD-10-CM | POA: Insufficient documentation

## 2016-12-26 LAB — GLUCOSE, CAPILLARY: Glucose-Capillary: 114 mg/dL — ABNORMAL HIGH (ref 65–99)

## 2016-12-26 MED ORDER — FLUDEOXYGLUCOSE F - 18 (FDG) INJECTION
6.4000 | Freq: Once | INTRAVENOUS | Status: AC | PRN
  Administered 2016-12-26: 6.4 via INTRAVENOUS

## 2016-12-28 ENCOUNTER — Telehealth: Payer: Self-pay | Admitting: Internal Medicine

## 2016-12-28 NOTE — Telephone Encounter (Signed)
Spoke with patient regarding his PET scan and informed him that it has been forwarded to Dr. Melford Aase his PCP.Marland Kitchen

## 2016-12-28 NOTE — Telephone Encounter (Signed)
New message    Pt is calling asking about pet scan. Please call.

## 2017-01-02 ENCOUNTER — Telehealth: Payer: Self-pay | Admitting: Internal Medicine

## 2017-01-02 DIAGNOSIS — R942 Abnormal results of pulmonary function studies: Secondary | ICD-10-CM

## 2017-01-02 NOTE — Telephone Encounter (Signed)
Patient advised that he is being referred to an ENT and his report was sent to Dr. Melford Aase based on the result note. Patient verbalized understanding.

## 2017-01-02 NOTE — Telephone Encounter (Signed)
New message   pt wife calling for results that was done on 12/26/2016

## 2017-01-17 ENCOUNTER — Other Ambulatory Visit: Payer: Self-pay | Admitting: Otolaryngology

## 2017-01-22 ENCOUNTER — Other Ambulatory Visit: Payer: Self-pay | Admitting: Otolaryngology

## 2017-01-23 NOTE — Pre-Procedure Instructions (Signed)
Jedadiah Abdallah  01/23/2017      [facility] - [place], [place] - [address] [place] [address] [place] [address] Phone: [phone] Fax: [phone]    Your procedure is scheduled on November 26  Report to [place] at [place].M.  Call this number if you have problems the morning of surgery:  [phone]   Remember:  Do not eat food or drink liquids after midnight.  Continue all medications as directed by your physician except follow these medication instructions before surgery below   Take these medicines the morning of surgery with A SIP OF WATER  albuterol (PROVENTIL HFA;VENTOLIN HFA) ALPRAZolam (XANAX) budesonide-formoterol (SYMBICORT)  escitalopram (LEXAPRO) ipratropium-albuterol (DUONEB) OXYGEN pantoprazole (PROTONIX)  tamsulosin (FLOMAX)  Tiotropium Bromide Monohydrate (SPIRIVA RESPIMAT)   7 days prior to surgery STOP taking any Aspirin(unless otherwise instructed by your surgeon), Aleve, Naproxen, Ibuprofen, Motrin, Advil, Goody's, BC's, all herbal medications, fish oil, and all vitamins  Follow your doctors instructions regarding your Aspirin.  If no instructions were given by your doctor, then you will need to call the prescribing office office to get instructions.     Do not wear jewelry  Do not wear lotions, powders, or cologne, or deoderant.   Men may shave face and neck.  Do not bring valuables to the hospital.  [facility] is not responsible for any belongings or valuables.  Contacts, dentures or bridgework may not be worn into surgery.  Leave your suitcase in the car.  After surgery it may be brought to your room.  For patients admitted to the hospital, discharge time will be determined by your treatment team.  Patients discharged the day of surgery will not be allowed to drive home.    Special instructions:   [place]- Preparing For Surgery  Before surgery,  you can play an important role. Because skin is not sterile, your skin needs to be as free of germs as possible. You can reduce the number of germs on your skin by washing with CHG (chlorahexidine gluconate) Soap before surgery.  CHG is an antiseptic cleaner which kills germs and bonds with the skin to continue killing germs even after washing.  Please do not use if you have an allergy to CHG or antibacterial soaps. If your skin becomes reddened/irritated stop using the CHG.  Do not shave (including legs and underarms) for at least 48 hours prior to first CHG shower. It is OK to shave your face.  Please follow these instructions carefully.   1. Shower the NIGHT BEFORE SURGERY and the MORNING OF SURGERY with CHG.   2. If you chose to wash your hair, wash your hair first as usual with your normal shampoo.  3. After you shampoo, rinse your hair and body thoroughly to remove the shampoo.  4. Use CHG as you would any other liquid soap. You can apply CHG directly to the skin and wash gently with a scrungie or a clean washcloth.   5. Apply the CHG Soap to your body ONLY FROM THE NECK DOWN.  Do not use on open wounds or open sores. Avoid contact with your eyes, ears, mouth and genitals (private parts). Wash Face and genitals (private parts)  with your normal soap.  6. Wash thoroughly, paying special attention to the area where your surgery will be performed.  7. Thoroughly rinse your body with warm water from the neck  down.  8. DO NOT shower/wash with your normal soap after using and rinsing off the CHG Soap.  9. Pat yourself dry with a CLEAN TOWEL.  10. Wear CLEAN PAJAMAS to bed the night before surgery, wear comfortable clothes the morning of surgery  11. Place CLEAN SHEETS on your bed the night of your first shower and DO NOT SLEEP WITH PETS.    Day of Surgery: Do not apply any deodorants/lotions. Please wear clean clothes to the hospital/surgery center.      Please read over the  following fact sheets that you were given.

## 2017-01-24 ENCOUNTER — Encounter (HOSPITAL_COMMUNITY): Payer: Self-pay

## 2017-01-24 ENCOUNTER — Other Ambulatory Visit: Payer: Self-pay

## 2017-01-24 ENCOUNTER — Ambulatory Visit (HOSPITAL_COMMUNITY)
Admission: RE | Admit: 2017-01-24 | Discharge: 2017-01-24 | Disposition: A | Discharge status: Home / Self Care | Payer: Medicare Other | Source: Ambulatory Visit | Attending: Anesthesiology | Admitting: Anesthesiology

## 2017-01-24 ENCOUNTER — Encounter (HOSPITAL_COMMUNITY)
Admission: RE | Admit: 2017-01-24 | Discharge: 2017-01-24 | Disposition: A | Discharge status: Home / Self Care | Payer: Medicare Other | Source: Ambulatory Visit | Attending: Otolaryngology | Admitting: Otolaryngology

## 2017-01-24 DIAGNOSIS — J984 Other disorders of lung: Secondary | ICD-10-CM | POA: Insufficient documentation

## 2017-01-24 DIAGNOSIS — Z01818 Encounter for other preprocedural examination: Secondary | ICD-10-CM

## 2017-01-24 DIAGNOSIS — J439 Emphysema, unspecified: Secondary | ICD-10-CM | POA: Diagnosis not present

## 2017-01-24 DIAGNOSIS — D106 Benign neoplasm of nasopharynx: Secondary | ICD-10-CM | POA: Insufficient documentation

## 2017-01-24 HISTORY — DX: Pneumonia, unspecified organism: J18.9

## 2017-01-24 HISTORY — DX: Gastro-esophageal reflux disease without esophagitis: K21.9

## 2017-01-24 LAB — BASIC METABOLIC PANEL
Anion gap: 8 (ref 5–15)
BUN: 12 mg/dL (ref 6–20)
CHLORIDE: 100 mmol/L — AB (ref 101–111)
CO2: 31 mmol/L (ref 22–32)
Calcium: 9.1 mg/dL (ref 8.9–10.3)
Creatinine, Ser: 0.92 mg/dL (ref 0.61–1.24)
GFR calc Af Amer: >60 mL/min (ref >60)
GFR calc non Af Amer: >60 mL/min (ref >60)
Glucose, Bld: 109 mg/dL — ABNORMAL HIGH (ref 65–99)
POTASSIUM: 6.2 mmol/L — AB (ref 3.5–5.1)
SODIUM: 139 mmol/L (ref 135–145)

## 2017-01-24 LAB — CBC
HEMATOCRIT: 43.2 % (ref 39.0–52.0)
HEMOGLOBIN: 13.7 g/dL (ref 13.0–17.0)
MCH: 29.3 pg (ref 26.0–34.0)
MCHC: 31.7 g/dL (ref 30.0–36.0)
MCV: 92.3 fL (ref 78.0–100.0)
Platelets: 250 10*3/uL (ref 150–400)
RBC: 4.68 MIL/uL (ref 4.22–5.81)
RDW: 16 % — ABNORMAL HIGH (ref 11.5–15.5)
WBC: 7.9 10*3/uL (ref 4.0–10.5)

## 2017-01-24 NOTE — Pre-Procedure Instructions (Signed)
Jay Mullins  01/24/2017      [facility] - [place], [place] - [address] [place] [address] [place] [address] Phone: [phone] Fax: [phone]    Your procedure is scheduled on November 26  Report to [place] [facility] at [place].M.  Call this number if you have problems the morning of surgery:  [phone]   Remember:  Do not eat food or drink liquids after midnight.  Continue all medications as directed by your physician except follow these medication instructions before surgery below   Take these medicines the morning of surgery with A SIP OF WATER  albuterol (PROVENTIL HFA;VENTOLIN HFA)(bring with you) ALPRAZolam (XANAX) budesonide-formoterol (SYMBICORT)  escitalopram (LEXAPRO) ipratropium-albuterol (DUONEB) pantoprazole (PROTONIX)  tamsulosin (FLOMAX)  Tiotropium Bromide Monohydrate (SPIRIVA RESPIMAT)  fioricet  If needed  Starting today 01/24/17 STOP taking any Aspirin(unless otherwise instructed by your surgeon), Aleve, Naproxen, Ibuprofen, Motrin, Advil, Goody's, BC's, all herbal medications, fish oil, and all vitamins   Do not wear jewelry  Do not wear lotions, powders, or cologne, or deoderant.   Men may shave face and neck.  Do not bring valuables to the hospital.  [facility] is not responsible for any belongings or valuables.  Contacts, dentures or bridgework may not be worn into surgery.  Leave your suitcase in the car.  After surgery it may be brought to your room.  For patients admitted to the hospital, discharge time will be determined by your treatment team.  Patients discharged the day of surgery will not be allowed to drive home.    Special instructions:   [place]- Preparing For Surgery  Before surgery, you can play an important role. Because skin is not sterile, your skin needs to be as free of germs as possible. You can reduce the number of germs on your  skin by washing with CHG (chlorahexidine gluconate) Soap before surgery.  CHG is an antiseptic cleaner which kills germs and bonds with the skin to continue killing germs even after washing.  Please do not use if you have an allergy to CHG or antibacterial soaps. If your skin becomes reddened/irritated stop using the CHG.  Do not shave (including legs and underarms) for at least 48 hours prior to first CHG shower. It is OK to shave your face.  Please follow these instructions carefully.   1. Shower the NIGHT BEFORE SURGERY and the MORNING OF SURGERY with CHG.   2. If you chose to wash your hair, wash your hair first as usual with your normal shampoo.  3. After you shampoo, rinse your hair and body thoroughly to remove the shampoo.  4. Use CHG as you would any other liquid soap. You can apply CHG directly to the skin and wash gently with a scrungie or a clean washcloth.   5. Apply the CHG Soap to your body ONLY FROM THE NECK DOWN.  Do not use on open wounds or open sores. Avoid contact with your eyes, ears, mouth and genitals (private parts). Wash Face and genitals (private parts)  with your normal soap.  6. Wash thoroughly, paying special attention to the area where your surgery will be performed.  7. Thoroughly rinse your body with warm water from the neck down.  8. DO NOT shower/wash with your normal soap after using and rinsing off the CHG Soap.  9. Pat yourself dry with a CLEAN TOWEL.  10.  Wear CLEAN PAJAMAS to bed the night before surgery, wear comfortable clothes the morning of surgery  11. Place CLEAN SHEETS on your bed the night of your first shower and DO NOT SLEEP WITH PETS.    Day of Surgery: Do not apply any deodorants/lotions. Please wear clean clothes to the hospital/surgery center.      Please read over the  fact sheets that you were given.

## 2017-01-24 NOTE — Pre-Procedure Instructions (Signed)
Gaylin Bulthuis  01/24/2017      [facility] - [place], [place] - [address] [place] [address] [place] [address] Phone: [phone] Fax: [phone]    Your procedure is scheduled on November 26  Report to [place] at [place].M.  Call this number if you have problems the morning of surgery:  [phone]   Remember:  Do not eat food or drink liquids after midnight.  Continue all medications as directed by your physician except follow these medication instructions before surgery below   Take these medicines the morning of surgery with A SIP OF WATER  albuterol (PROVENTIL HFA;VENTOLIN HFA)(bring with you) ALPRAZolam (XANAX) budesonide-formoterol (SYMBICORT)  escitalopram (LEXAPRO) ipratropium-albuterol (DUONEB) pantoprazole (PROTONIX)  tamsulosin (FLOMAX)  Tiotropium Bromide Monohydrate (SPIRIVA RESPIMAT)  fioricet  If needed  Starting today 01/24/17 STOP taking any Aspirin(unless otherwise instructed by your surgeon), Aleve, Naproxen, Ibuprofen, Motrin, Advil, Goody's, BC's, all herbal medications, fish oil, and all vitamins   Do not wear jewelry  Do not wear lotions, powders, or cologne, or deoderant.   Men may shave face and neck.  Do not bring valuables to the hospital.  [facility] is not responsible for any belongings or valuables.  Contacts, dentures or bridgework may not be worn into surgery.  Leave your suitcase in the car.  After surgery it may be brought to your room.  For patients admitted to the hospital, discharge time will be determined by your treatment team.  Patients discharged the day of surgery will not be allowed to drive home.    Special instructions:   [place]- Preparing For Surgery  Before surgery, you can play an important role. Because skin is not sterile, your skin needs to be as free of germs as possible. You can reduce the number of germs on your  skin by washing with CHG (chlorahexidine gluconate) Soap before surgery.  CHG is an antiseptic cleaner which kills germs and bonds with the skin to continue killing germs even after washing.  Please do not use if you have an allergy to CHG or antibacterial soaps. If your skin becomes reddened/irritated stop using the CHG.  Do not shave (including legs and underarms) for at least 48 hours prior to first CHG shower. It is OK to shave your face.  Please follow these instructions carefully.   1. Shower the NIGHT BEFORE SURGERY and the MORNING OF SURGERY with CHG.   2. If you chose to wash your hair, wash your hair first as usual with your normal shampoo.  3. After you shampoo, rinse your hair and body thoroughly to remove the shampoo.  4. Use CHG as you would any other liquid soap. You can apply CHG directly to the skin and wash gently with a scrungie or a clean washcloth.   5. Apply the CHG Soap to your body ONLY FROM THE NECK DOWN.  Do not use on open wounds or open sores. Avoid contact with your eyes, ears, mouth and genitals (private parts). Wash Face and genitals (private parts)  with your normal soap.  6. Wash thoroughly, paying special attention to the area where your surgery will be performed.  7. Thoroughly rinse your body with warm water from the neck down.  8. DO NOT shower/wash with your normal soap after using and rinsing off the CHG Soap.  9. Pat yourself dry with a CLEAN TOWEL.  10.  Wear CLEAN PAJAMAS to bed the night before surgery, wear comfortable clothes the morning of surgery  11. Place CLEAN SHEETS on your bed the night of your first shower and DO NOT SLEEP WITH PETS.    Day of Surgery: Do not apply any deodorants/lotions. Please wear clean clothes to the hospital/surgery center.      Please read over the  fact sheets that you were given.

## 2017-01-26 NOTE — Progress Notes (Signed)
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  - PCP is Anastasia Pall, MD. Last office visit 01/1517 documents he is monitoring pt for weight loss, increased O2 requirements. Dr. Melford Aase is aware of upcoming surgery. (notes in care everywhere)   - Cardiologist is Dorris Carnes, MD. Last office visit 12/07/16.   PMH includes:  MI (1980's, ? Balloon angioplasty?), HTN, AAA (s/p EVAR 07/27/16), emphysema, COPD. Uses 4L O2 (increased from 2L after pneumonia 2 months ago). Current smoker. BMI 19.  -  Hospitalized 9/15-18/18 for acute on chronic respiratory failure secondary to bacterial pneumonia.  Medications include: Albuterol, Symbicort, DuoNeb, lisinopril, Protonix, pravastatin, Spiriva  BP 140/82   Pulse 87   Temp 36.4 C (Oral)   Resp 20   Ht 5\' 9"  (1.753 m)   Wt 130 lb 4.7 oz (59.1 kg)   SpO2 97%   BMI 19.24 kg/m    Preoperative labs reviewed. - K 6.0 but specimen was hemolyzed.  Will recheck K day of surgery.   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 Areas of scarring bilaterally, most notably in the right mid lung anteriorly. No edema or consolidation. - Heart size normal. No evident adenopathy. - Emphysema  EKG 07/27/16: NSR. LAD  PET scan 12/26/16:  1. Band of linear nodular thickening in the RIGHT lower lobe is increased in size from 08/30/2016. However this nodularity has mild metabolic activity which favors benign inflammatory thickening. Consider follow-up CT in its 6 months. 2. Hypermetabolic nodular thickening in the posterior nasopharynx the level of the soft palate. Recommend direct visualization to exclude oropharyngeal malignancy. 3. Severe centrilobular emphysema in the LEFT and RIGHT lung. 4. Hypermetabolic diverticula of the sigmoid colon suggest  mild acute or subacute diverticulitis.  Cardiac event monitor 08/24/16:  - Sinus rhythm  Rare PVC  No significant arrhythmias   Nuclear stress test 07/21/16:   Nuclear stress EF: 53%.  There was no ST segment deviation noted during stress.  Defect 1: There is a large defect of moderate severity present in the basal inferior and mid inferior location.  Defect 2: There is a small defect of moderate severity present in the basal anteroseptal location. Low risk stress nuclear study with prominent diaphragmatic attenuation and very mild ischemia in the basal septum; EF 53 with normal wall motion.   CT angiogram abdomen pelvis 06/27/16: VASCULAR - Abdominal aortic aneurysm has increased in diameter from 5.2 cm to 5.6 cm. 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  NON-VASCULAR - 1.8 cm nodular parenchymal density at the right lung base may represent round atelectasis. Follow-up CT in 3 months is recommended. True pulmonary nodule is not excluded. Initial follow-up by chest CT without contrast is recommended in 3 months to confirm persistence.  - Chronic changes as described. Stability of these findings supports benign etiology.  If labs acceptable, I anticipate pt can proceed with surgery as scheduled.   Willeen Cass, FNP-BC [facility]/Anesthesiology Phone: [phone] 01/26/2017 11:34 AM

## 2017-01-28 NOTE — Anesthesia Preprocedure Evaluation (Addendum)
Anesthesia Evaluation  Patient identified by MRN, date of birth, ID band Patient awake    Reviewed: Allergy & Precautions, H&P , NPO status , Patient's Chart, lab work & pertinent test results  Airway Mallampati: II  TM Distance: >3 FB Neck ROM: Full    Dental no notable dental hx. (+) Edentulous Upper, Edentulous Lower, Dental Advisory Given   Pulmonary COPD,  COPD inhaler and oxygen dependent, Current Smoker,    Pulmonary exam normal breath sounds clear to auscultation       Cardiovascular hypertension, Pt. on medications + CAD and + Peripheral Vascular Disease (AAA s/p EVAR)   Rhythm:Regular Rate:Normal  07/2016 Nuclear stress test: Low risk stress nuclear study with prominent diaphragmatic attenuation and very mild ischemia in the basal septum; EF 53 with normal wall motion.    Neuro/Psych  Headaches, Depression negative psych ROS   GI/Hepatic Neg liver ROS, GERD  ,  Endo/Other  negative endocrine ROS  Renal/GU negative Renal ROS  negative genitourinary   Musculoskeletal  (+) Arthritis , Osteoarthritis,    Abdominal   Peds  Hematology negative hematology ROS (+)   Anesthesia Other Findings   Reproductive/Obstetrics negative OB ROS                                                             Anesthesia Evaluation  Patient identified by MRN, date of birth, ID band Patient awake    Reviewed: Allergy & Precautions, H&P , NPO status , Patient's Chart, lab work & pertinent test results  Airway Mallampati: II  TM Distance: >3 FB Neck ROM: Full    Dental no notable dental hx. (+) Edentulous Upper, Edentulous Lower, Dental Advisory Given   Pulmonary COPD,  COPD inhaler and oxygen dependent, Current Smoker,    Pulmonary exam normal breath sounds clear to auscultation       Cardiovascular hypertension, Pt. on medications + Peripheral Vascular Disease   Rhythm:Regular  Rate:Normal     Neuro/Psych  Headaches, negative psych ROS   GI/Hepatic negative GI ROS, Neg liver ROS,   Endo/Other  negative endocrine ROS  Renal/GU negative Renal ROS  negative genitourinary   Musculoskeletal  (+) Arthritis , Osteoarthritis,    Abdominal   Peds  Hematology negative hematology ROS (+)   Anesthesia Other Findings   Reproductive/Obstetrics negative OB ROS                            Anesthesia Physical Anesthesia Plan  ASA: III  Anesthesia Plan: General   Post-op Pain Management:    Induction: Intravenous  Airway Management Planned: Oral ETT  Additional Equipment: Arterial line  Intra-op Plan:   Post-operative Plan: Extubation in OR and Possible Post-op intubation/ventilation  Informed Consent: I have reviewed the patients History and Physical, chart, labs and discussed the procedure including the risks, benefits and alternatives for the proposed anesthesia with the patient or authorized representative who has indicated his/her understanding and acceptance.   Dental advisory given  Plan Discussed with: CRNA  Anesthesia Plan Comments:        Anesthesia Quick Evaluation  Anesthesia Physical  Anesthesia Plan  ASA: III  Anesthesia Plan: General   Post-op Pain Management:    Induction: Intravenous  PONV Risk Score and Plan:  1 and Ondansetron and Treatment may vary due to age or medical condition  Airway Management Planned: Oral ETT  Additional Equipment:   Intra-op Plan:   Post-operative Plan: Extubation in OR and Possible Post-op intubation/ventilation  Informed Consent: I have reviewed the patients History and Physical, chart, labs and discussed the procedure including the risks, benefits and alternatives for the proposed anesthesia with the patient or authorized representative who has indicated his/her understanding and acceptance.   Dental advisory given  Plan Discussed with:  CRNA  Anesthesia Plan Comments:        Anesthesia Quick Evaluation

## 2017-01-29 ENCOUNTER — Ambulatory Visit (HOSPITAL_COMMUNITY): Payer: Medicare Other | Admitting: Certified Registered Nurse Anesthetist

## 2017-01-29 ENCOUNTER — Encounter (HOSPITAL_COMMUNITY)
Admission: RE | Disposition: A | Discharge status: Home / Self Care | Payer: Self-pay | Source: Ambulatory Visit | Attending: Otolaryngology

## 2017-01-29 ENCOUNTER — Encounter (HOSPITAL_COMMUNITY): Payer: Self-pay

## 2017-01-29 ENCOUNTER — Observation Stay (HOSPITAL_COMMUNITY)
Admission: RE | Admit: 2017-01-29 | Discharge: 2017-01-30 | Disposition: A | Discharge status: Home / Self Care | Payer: Medicare Other | Source: Ambulatory Visit | Attending: Otolaryngology | Admitting: Otolaryngology

## 2017-01-29 ENCOUNTER — Ambulatory Visit (HOSPITAL_COMMUNITY): Payer: Medicare Other | Admitting: Emergency Medicine

## 2017-01-29 ENCOUNTER — Other Ambulatory Visit: Payer: Self-pay

## 2017-01-29 DIAGNOSIS — N400 Enlarged prostate without lower urinary tract symptoms: Secondary | ICD-10-CM | POA: Diagnosis not present

## 2017-01-29 DIAGNOSIS — Z7982 Long term (current) use of aspirin: Secondary | ICD-10-CM | POA: Insufficient documentation

## 2017-01-29 DIAGNOSIS — F329 Major depressive disorder, single episode, unspecified: Secondary | ICD-10-CM | POA: Insufficient documentation

## 2017-01-29 DIAGNOSIS — I10 Essential (primary) hypertension: Secondary | ICD-10-CM | POA: Insufficient documentation

## 2017-01-29 DIAGNOSIS — I2510 Atherosclerotic heart disease of native coronary artery without angina pectoris: Secondary | ICD-10-CM | POA: Insufficient documentation

## 2017-01-29 DIAGNOSIS — M1990 Unspecified osteoarthritis, unspecified site: Secondary | ICD-10-CM | POA: Insufficient documentation

## 2017-01-29 DIAGNOSIS — I714 Abdominal aortic aneurysm, without rupture: Secondary | ICD-10-CM | POA: Insufficient documentation

## 2017-01-29 DIAGNOSIS — R51 Headache: Secondary | ICD-10-CM | POA: Insufficient documentation

## 2017-01-29 DIAGNOSIS — D0008 Carcinoma in situ of pharynx: Secondary | ICD-10-CM | POA: Diagnosis not present

## 2017-01-29 DIAGNOSIS — D106 Benign neoplasm of nasopharynx: Secondary | ICD-10-CM | POA: Diagnosis present

## 2017-01-29 DIAGNOSIS — I252 Old myocardial infarction: Secondary | ICD-10-CM | POA: Diagnosis not present

## 2017-01-29 DIAGNOSIS — F17210 Nicotine dependence, cigarettes, uncomplicated: Secondary | ICD-10-CM | POA: Insufficient documentation

## 2017-01-29 DIAGNOSIS — Z79899 Other long term (current) drug therapy: Secondary | ICD-10-CM | POA: Insufficient documentation

## 2017-01-29 DIAGNOSIS — Z9981 Dependence on supplemental oxygen: Secondary | ICD-10-CM | POA: Insufficient documentation

## 2017-01-29 DIAGNOSIS — J449 Chronic obstructive pulmonary disease, unspecified: Secondary | ICD-10-CM | POA: Diagnosis not present

## 2017-01-29 DIAGNOSIS — K219 Gastro-esophageal reflux disease without esophagitis: Secondary | ICD-10-CM | POA: Diagnosis not present

## 2017-01-29 DIAGNOSIS — I739 Peripheral vascular disease, unspecified: Secondary | ICD-10-CM | POA: Diagnosis not present

## 2017-01-29 HISTORY — PX: ENDOSCOPIC TRANS NASAL APPROACH WITH FUSION: SHX6750

## 2017-01-29 LAB — POCT I-STAT 4, (NA,K, GLUC, HGB,HCT)
GLUCOSE: 115 mg/dL — AB (ref 65–99)
HCT: 39 % (ref 39.0–52.0)
Hemoglobin: 13.3 g/dL (ref 13.0–17.0)
POTASSIUM: 4.2 mmol/L (ref 3.5–5.1)
SODIUM: 139 mmol/L (ref 135–145)

## 2017-01-29 SURGERY — ENDOSCOPIC TRANS NASAL APPROACH WITH FUSION
Anesthesia: General

## 2017-01-29 MED ORDER — MUPIROCIN 2 % EX OINT
TOPICAL_OINTMENT | CUTANEOUS | Status: AC
  Filled 2017-01-29: qty 22

## 2017-01-29 MED ORDER — OXYCODONE HCL 5 MG/5ML PO SOLN: 5.0000 mg | Freq: Once | ORAL | Status: DC | PRN

## 2017-01-29 MED ORDER — ONDANSETRON HCL 4 MG/2ML IJ SOLN
INTRAMUSCULAR | Status: AC
  Filled 2017-01-29: qty 2

## 2017-01-29 MED ORDER — OXYMETAZOLINE HCL 0.05 % NA SOLN
NASAL | Status: AC
  Filled 2017-01-29: qty 15

## 2017-01-29 MED ORDER — OXYMETAZOLINE HCL 0.05 % NA SOLN
NASAL | Status: DC | PRN
  Administered 2017-01-29: 1 via TOPICAL
  Administered 2017-01-29: 2 via TOPICAL

## 2017-01-29 MED ORDER — LISINOPRIL 10 MG PO TABS
10.0000 mg | ORAL_TABLET | Freq: Every day | ORAL | Status: DC
  Administered 2017-01-29: 10 mg via ORAL
  Filled 2017-01-29: qty 1

## 2017-01-29 MED ORDER — ROCURONIUM BROMIDE 100 MG/10ML IV SOLN
INTRAVENOUS | Status: DC | PRN
  Administered 2017-01-29: 50 mg via INTRAVENOUS

## 2017-01-29 MED ORDER — ALPRAZOLAM 0.5 MG PO TABS
1.0000 mg | ORAL_TABLET | Freq: Every evening | ORAL | Status: DC | PRN
  Administered 2017-01-29: 1 mg via ORAL
  Filled 2017-01-29: qty 2

## 2017-01-29 MED ORDER — PROPOFOL 10 MG/ML IV BOLUS
INTRAVENOUS | Status: AC
  Filled 2017-01-29: qty 20

## 2017-01-29 MED ORDER — TAMSULOSIN HCL 0.4 MG PO CAPS
0.4000 mg | ORAL_CAPSULE | Freq: Every day | ORAL | Status: DC
Start: 2017-01-30 — End: 2017-01-30
  Administered 2017-01-30: 0.4 mg via ORAL
  Filled 2017-01-29: qty 1

## 2017-01-29 MED ORDER — PHENOL 1.4 % MT LIQD
1.0000 | OROMUCOSAL | Status: DC | PRN
  Administered 2017-01-29: 1 via OROMUCOSAL
  Filled 2017-01-29: qty 177

## 2017-01-29 MED ORDER — FENTANYL CITRATE (PF) 250 MCG/5ML IJ SOLN
INTRAMUSCULAR | Status: AC
  Filled 2017-01-29: qty 5

## 2017-01-29 MED ORDER — IPRATROPIUM-ALBUTEROL 0.5-2.5 (3) MG/3ML IN SOLN
3.0000 mL | Freq: Four times a day (QID) | RESPIRATORY_TRACT | Status: DC | PRN
  Administered 2017-01-29 – 2017-01-30 (×3): 3 mL via RESPIRATORY_TRACT
  Filled 2017-01-29 (×3): qty 3

## 2017-01-29 MED ORDER — MIDAZOLAM HCL 5 MG/5ML IJ SOLN
INTRAMUSCULAR | Status: DC | PRN
  Administered 2017-01-29: 1 mg via INTRAVENOUS

## 2017-01-29 MED ORDER — HYDROMORPHONE HCL 1 MG/ML IJ SOLN
0.2500 mg | INTRAMUSCULAR | Status: DC | PRN
  Administered 2017-01-29: 0.25 mg via INTRAVENOUS

## 2017-01-29 MED ORDER — HYDROMORPHONE HCL 1 MG/ML IJ SOLN
INTRAMUSCULAR | Status: AC
  Filled 2017-01-29: qty 1

## 2017-01-29 MED ORDER — ONDANSETRON HCL 4 MG/2ML IJ SOLN
INTRAMUSCULAR | Status: DC | PRN
  Administered 2017-01-29: 4 mg via INTRAVENOUS

## 2017-01-29 MED ORDER — EPHEDRINE 5 MG/ML INJ
INTRAVENOUS | Status: AC
  Filled 2017-01-29: qty 10

## 2017-01-29 MED ORDER — ALPRAZOLAM 1 MG PO TABS: 1.0000 mg | ORAL_TABLET | Freq: Every evening | ORAL | Status: DC | PRN

## 2017-01-29 MED ORDER — ALBUMIN HUMAN 5 % IV SOLN
INTRAVENOUS | Status: DC | PRN
  Administered 2017-01-29: 08:00:00 via INTRAVENOUS

## 2017-01-29 MED ORDER — ALPRAZOLAM 0.5 MG PO TABS: 0.5000 mg | ORAL_TABLET | Freq: Every day | ORAL | Status: DC | PRN

## 2017-01-29 MED ORDER — PHENYLEPHRINE HCL 10 MG/ML IJ SOLN
INTRAMUSCULAR | Status: DC | PRN
  Administered 2017-01-29: 80 ug via INTRAVENOUS
  Administered 2017-01-29 (×3): 120 ug via INTRAVENOUS
  Administered 2017-01-29 (×2): 80 ug via INTRAVENOUS
  Administered 2017-01-29: 120 ug via INTRAVENOUS
  Administered 2017-01-29: 80 ug via INTRAVENOUS
  Administered 2017-01-29 (×2): 120 ug via INTRAVENOUS

## 2017-01-29 MED ORDER — MIDAZOLAM HCL 2 MG/2ML IJ SOLN
INTRAMUSCULAR | Status: AC
  Filled 2017-01-29: qty 2

## 2017-01-29 MED ORDER — SUGAMMADEX SODIUM 200 MG/2ML IV SOLN
INTRAVENOUS | Status: AC
  Filled 2017-01-29: qty 2

## 2017-01-29 MED ORDER — ROCURONIUM BROMIDE 10 MG/ML (PF) SYRINGE
PREFILLED_SYRINGE | INTRAVENOUS | Status: AC
  Filled 2017-01-29: qty 5

## 2017-01-29 MED ORDER — PANTOPRAZOLE SODIUM 40 MG PO TBEC
40.0000 mg | DELAYED_RELEASE_TABLET | Freq: Every day | ORAL | Status: DC
  Administered 2017-01-30: 40 mg via ORAL
  Filled 2017-01-29: qty 1

## 2017-01-29 MED ORDER — EPHEDRINE SULFATE 50 MG/ML IJ SOLN
INTRAMUSCULAR | Status: DC | PRN
  Administered 2017-01-29 (×2): 5 mg via INTRAVENOUS

## 2017-01-29 MED ORDER — OXYMETAZOLINE HCL 0.05 % NA SOLN
NASAL | Status: DC | PRN
  Administered 2017-01-29: 2 via NASAL

## 2017-01-29 MED ORDER — OXYCODONE HCL 5 MG PO TABS: 5.0000 mg | ORAL_TABLET | Freq: Once | ORAL | Status: DC | PRN

## 2017-01-29 MED ORDER — LIDOCAINE-EPINEPHRINE 1 %-1:100000 IJ SOLN
INTRAMUSCULAR | Status: DC | PRN
  Administered 2017-01-29: 5 mL

## 2017-01-29 MED ORDER — TIOTROPIUM BROMIDE MONOHYDRATE 2.5 MCG/ACT IN AERS
1.0000 | INHALATION_SPRAY | Freq: Every day | RESPIRATORY_TRACT | Status: DC
Start: 2017-01-29 — End: 2017-01-29

## 2017-01-29 MED ORDER — ALBUTEROL SULFATE HFA 108 (90 BASE) MCG/ACT IN AERS: 2.0000 | INHALATION_SPRAY | Freq: Four times a day (QID) | RESPIRATORY_TRACT | Status: DC | PRN

## 2017-01-29 MED ORDER — ALBUTEROL SULFATE (2.5 MG/3ML) 0.083% IN NEBU
2.5000 mg | INHALATION_SOLUTION | Freq: Four times a day (QID) | RESPIRATORY_TRACT | Status: DC | PRN
  Administered 2017-01-29 – 2017-01-30 (×2): 2.5 mg via RESPIRATORY_TRACT
  Filled 2017-01-29 (×2): qty 3

## 2017-01-29 MED ORDER — PROMETHAZINE HCL 25 MG/ML IJ SOLN
6.2500 mg | INTRAMUSCULAR | Status: DC | PRN
Start: 2017-01-29 — End: 2017-01-29

## 2017-01-29 MED ORDER — SODIUM CHLORIDE 0.9 % IR SOLN
Status: DC | PRN
  Administered 2017-01-29 (×2): 1000 mL

## 2017-01-29 MED ORDER — PROPOFOL 10 MG/ML IV BOLUS
INTRAVENOUS | Status: DC | PRN
  Administered 2017-01-29: 120 mg via INTRAVENOUS
  Administered 2017-01-29: 30 mg via INTRAVENOUS

## 2017-01-29 MED ORDER — LIDOCAINE-EPINEPHRINE 1 %-1:100000 IJ SOLN
INTRAMUSCULAR | Status: AC
  Filled 2017-01-29: qty 1

## 2017-01-29 MED ORDER — KCL IN DEXTROSE-NACL 20-5-0.45 MEQ/L-%-% IV SOLN
INTRAVENOUS | Status: DC
  Administered 2017-01-29: 18:00:00 via INTRAVENOUS
  Filled 2017-01-29 (×2): qty 1000

## 2017-01-29 MED ORDER — VITAMIN D 1000 UNITS PO TABS
1000.0000 [IU] | ORAL_TABLET | Freq: Every day | ORAL | Status: DC
  Administered 2017-01-30: 1000 [IU] via ORAL
  Filled 2017-01-29: qty 1

## 2017-01-29 MED ORDER — ONDANSETRON HCL 4 MG PO TABS: 4.0000 mg | ORAL_TABLET | ORAL | Status: DC | PRN

## 2017-01-29 MED ORDER — BUTALBITAL-APAP-CAFFEINE 50-325-40 MG PO TABS
2.0000 | ORAL_TABLET | ORAL | Status: DC | PRN
  Administered 2017-01-29 – 2017-01-30 (×2): 2 via ORAL
  Filled 2017-01-29 (×3): qty 2

## 2017-01-29 MED ORDER — SUGAMMADEX SODIUM 200 MG/2ML IV SOLN
INTRAVENOUS | Status: DC | PRN
  Administered 2017-01-29: 125 mg via INTRAVENOUS

## 2017-01-29 MED ORDER — PRAVASTATIN SODIUM 20 MG PO TABS
20.0000 mg | ORAL_TABLET | Freq: Every day | ORAL | Status: DC
  Administered 2017-01-29: 20 mg via ORAL
  Filled 2017-01-29: qty 1

## 2017-01-29 MED ORDER — MOMETASONE FURO-FORMOTEROL FUM 200-5 MCG/ACT IN AERO
2.0000 | INHALATION_SPRAY | Freq: Two times a day (BID) | RESPIRATORY_TRACT | Status: DC
  Administered 2017-01-29 – 2017-01-30 (×2): 2 via RESPIRATORY_TRACT
  Filled 2017-01-29: qty 8.8

## 2017-01-29 MED ORDER — PHENYLEPHRINE 40 MCG/ML (10ML) SYRINGE FOR IV PUSH (FOR BLOOD PRESSURE SUPPORT)
PREFILLED_SYRINGE | INTRAVENOUS | Status: AC
  Filled 2017-01-29: qty 10

## 2017-01-29 MED ORDER — MEPERIDINE HCL 25 MG/ML IJ SOLN: 6.2500 mg | INTRAMUSCULAR | Status: DC | PRN

## 2017-01-29 MED ORDER — ONDANSETRON HCL 4 MG/2ML IJ SOLN: 4.0000 mg | INTRAMUSCULAR | Status: DC | PRN

## 2017-01-29 MED ORDER — COLCHICINE 0.6 MG PO TABS: 0.6000 mg | ORAL_TABLET | Freq: Four times a day (QID) | ORAL | Status: DC | PRN

## 2017-01-29 MED ORDER — LACTATED RINGERS IV SOLN
INTRAVENOUS | Status: DC | PRN
  Administered 2017-01-29 (×2): via INTRAVENOUS

## 2017-01-29 MED ORDER — HYDROCODONE-ACETAMINOPHEN 7.5-325 MG/15ML PO SOLN
10.0000 mL | ORAL | Status: DC | PRN
  Administered 2017-01-29 (×2): 15 mL via ORAL
  Filled 2017-01-29 (×2): qty 15

## 2017-01-29 MED ORDER — SUCCINYLCHOLINE CHLORIDE 200 MG/10ML IV SOSY
PREFILLED_SYRINGE | INTRAVENOUS | Status: AC
  Filled 2017-01-29: qty 10

## 2017-01-29 MED ORDER — DEXAMETHASONE SODIUM PHOSPHATE 10 MG/ML IJ SOLN
INTRAMUSCULAR | Status: DC | PRN
  Administered 2017-01-29: 10 mg via INTRAVENOUS

## 2017-01-29 MED ORDER — ASPIRIN EC 81 MG PO TBEC
81.0000 mg | DELAYED_RELEASE_TABLET | Freq: Every day | ORAL | Status: DC
  Administered 2017-01-30: 81 mg via ORAL
  Filled 2017-01-29: qty 1

## 2017-01-29 MED ORDER — TIOTROPIUM BROMIDE MONOHYDRATE 18 MCG IN CAPS
18.0000 ug | ORAL_CAPSULE | Freq: Every day | RESPIRATORY_TRACT | Status: DC
  Administered 2017-01-30: 18 ug via RESPIRATORY_TRACT
  Filled 2017-01-29: qty 5

## 2017-01-29 MED ORDER — FINASTERIDE 5 MG PO TABS
5.0000 mg | ORAL_TABLET | Freq: Every day | ORAL | Status: DC
  Administered 2017-01-29: 5 mg via ORAL
  Filled 2017-01-29: qty 1

## 2017-01-29 MED ORDER — DEXAMETHASONE SODIUM PHOSPHATE 10 MG/ML IJ SOLN
INTRAMUSCULAR | Status: AC
  Filled 2017-01-29: qty 1

## 2017-01-29 MED ORDER — FENTANYL CITRATE (PF) 100 MCG/2ML IJ SOLN
INTRAMUSCULAR | Status: DC | PRN
  Administered 2017-01-29: 25 ug via INTRAVENOUS
  Administered 2017-01-29: 100 ug via INTRAVENOUS

## 2017-01-29 MED ORDER — MORPHINE SULFATE (PF) 2 MG/ML IV SOLN: 2.0000 mg | INTRAVENOUS | Status: DC | PRN

## 2017-01-29 MED ORDER — LIDOCAINE 2% (20 MG/ML) 5 ML SYRINGE
INTRAMUSCULAR | Status: AC
  Filled 2017-01-29: qty 5

## 2017-01-29 MED ORDER — ESCITALOPRAM OXALATE 20 MG PO TABS
20.0000 mg | ORAL_TABLET | Freq: Every day | ORAL | Status: DC
  Administered 2017-01-30: 20 mg via ORAL
  Filled 2017-01-29: qty 1

## 2017-01-29 SURGICAL SUPPLY — 31 items
BLADE RAD60 ROTATE M4 4 5PK (BLADE) ×2 IMPLANT
BLADE RAD60 ROTATE M4 4MM 5PK (BLADE) ×1
BLADE TRICUT ROTATE M4 4 5PK (BLADE) ×2 IMPLANT
BLADE TRICUT ROTATE M4 4MM 5PK (BLADE) ×1
CANISTER SUC SOCK COL 7 IN (MISCELLANEOUS) ×3 IMPLANT
CANISTER SUCT 3000ML PPV (MISCELLANEOUS) ×3 IMPLANT
CATH ROBINSON RED A/P 10FR (CATHETERS) ×3 IMPLANT
COAGULATOR SUCT 6 FR SWTCH (ELECTROSURGICAL) ×1
COAGULATOR SUCT SWTCH 10FR 6 (ELECTROSURGICAL) ×2 IMPLANT
CONT SPEC 4OZ CLIKSEAL STRL BL (MISCELLANEOUS) ×3 IMPLANT
CRADLE DONUT ADULT HEAD (MISCELLANEOUS) ×3 IMPLANT
ELECT REM PT RETURN 9FT ADLT (ELECTROSURGICAL) ×3
ELECTRODE REM PT RTRN 9FT ADLT (ELECTROSURGICAL) ×1 IMPLANT
GLOVE BIO SURGEON STRL SZ7.5 (GLOVE) ×3 IMPLANT
GOWN STRL REUS W/ TWL LRG LVL3 (GOWN DISPOSABLE) ×2 IMPLANT
GOWN STRL REUS W/TWL LRG LVL3 (GOWN DISPOSABLE) ×4
KIT BASIN OR (CUSTOM PROCEDURE TRAY) ×3 IMPLANT
KIT ROOM TURNOVER OR (KITS) ×3 IMPLANT
NEEDLE HYPO 25GX1X1/2 BEV (NEEDLE) ×3 IMPLANT
NEEDLE SPNL 22GX3.5 QUINCKE BK (NEEDLE) ×3 IMPLANT
NS IRRIG 1000ML POUR BTL (IV SOLUTION) ×3 IMPLANT
PAD ARMBOARD 7.5X6 YLW CONV (MISCELLANEOUS) ×6 IMPLANT
PATTIES SURGICAL .5 X3 (DISPOSABLE) ×3 IMPLANT
SHEATH ENDOSCRUB 45 DEG (SHEATH) ×3 IMPLANT
SOLUTION ANTI FOG 6CC (MISCELLANEOUS) ×3 IMPLANT
SOLUTION BUTLER CLEAR DIP (MISCELLANEOUS) ×3 IMPLANT
SPONGE TONSIL 1 RF SGL (DISPOSABLE) ×6 IMPLANT
TOWEL OR 17X24 6PK STRL BLUE (TOWEL DISPOSABLE) ×3 IMPLANT
TRAY ENT MC OR (CUSTOM PROCEDURE TRAY) ×3 IMPLANT
TUBE CONNECTING 12'X1/4 (SUCTIONS) ×2
TUBE CONNECTING 12X1/4 (SUCTIONS) ×4 IMPLANT

## 2017-01-29 NOTE — Progress Notes (Signed)
Patient ID: Jay Mullins, male   DOB: May 15, 1950, 66 y.o.   MRN: [id number] Postop check, doing well, complains of sore throat.  He is able to swallow.  The bleeding from his nose is slowing down nicely.  He is awake and alert, wants something for the sore throat.  Recommend some Chloraseptic spray.  Plan is to discharge tomorrow.

## 2017-01-29 NOTE — Anesthesia Postprocedure Evaluation (Signed)
Anesthesia Post Note  Patient: Jay Mullins  Procedure(s) Performed: ENDOSCOPIC TRANS NASAL APPROACH AND TRANS ORAL APPROACH WITH EXCISION PAPALOM (N/A )     Patient location during evaluation: PACU Anesthesia Type: General Level of consciousness: awake and alert Pain management: pain level controlled Vital Signs Assessment: post-procedure vital signs reviewed and stable Respiratory status: spontaneous breathing, nonlabored ventilation and respiratory function stable Cardiovascular status: blood pressure returned to baseline and stable Postop Assessment: no apparent nausea or vomiting Anesthetic complications: no    Last Vitals:  Vitals:   01/29/17 1006 01/29/17 1031  BP:  118/68  Pulse:  82  Resp:  20  Temp: 36.5 C   SpO2:  (!) 89%    Last Pain:  Vitals:   01/29/17 1039  TempSrc:   PainSc: [place]

## 2017-01-29 NOTE — Brief Op Note (Signed)
01/29/2017  8:55 AM  PATIENT:  Jay Mullins  66 y.o. male  PRE-OPERATIVE DIAGNOSIS:  NASOPHARYNGEAL PAPILLOMA  POST-OPERATIVE DIAGNOSIS:  NASOPHARYNGEAL PAPILLOMA  PROCEDURE:  Procedure(s): ENDOSCOPIC TRANS NASAL APPROACH AND TRANS ORAL APPROACH WITH EXCISION PAPALOM (N/A)  SURGEON:  Surgeon(s) and Role:    * Melida Quitter, MD - Primary  PHYSICIAN ASSISTANT:   ASSISTANTS: none   ANESTHESIA:   general  EBL:  50 mL   BLOOD ADMINISTERED:none  DRAINS: none   LOCAL MEDICATIONS USED:  LIDOCAINE   SPECIMEN:  Source of Specimen:  Superior soft palate papilloma  DISPOSITION OF SPECIMEN:  PATHOLOGY  COUNTS:  YES  TOURNIQUET:  * No tourniquets in log *  DICTATION: .Other Dictation: Dictation Number [id number]  PLAN OF CARE: Admit for overnight observation  PATIENT DISPOSITION:  PACU - hemodynamically stable.   Delay start of Pharmacological VTE agent (>24hrs) due to surgical blood loss or risk of bleeding: yes

## 2017-01-29 NOTE — Op Note (Deleted)
  The note originally documented on this encounter has been moved the the encounter in which it belongs.  

## 2017-01-29 NOTE — Transfer of Care (Signed)
Immediate Anesthesia Transfer of Care Note  Patient: Estill Llerena  Procedure(s) Performed: ENDOSCOPIC TRANS NASAL APPROACH AND TRANS ORAL APPROACH WITH EXCISION PAPALOM (N/A )  Patient Location: PACU  Anesthesia Type:General  Level of Consciousness: awake, alert , oriented and patient cooperative  Airway & Oxygen Therapy: Patient Spontanous Breathing and Patient connected to face mask oxygen  Post-op Assessment: Report given to RN and Post -op Vital signs reviewed and stable  Post vital signs: Reviewed and stable  Last Vitals:  Vitals:   01/29/17 0633 01/29/17 0915  BP: 124/79   Pulse: 82   Resp: 18   Temp: 36.7 C (!) 36.4 C  SpO2: 95%     Last Pain:  Vitals:   01/29/17 0633  TempSrc: Oral         Complications: No apparent anesthesia complications

## 2017-01-29 NOTE — H&P (Signed)
Jay Mullins is an 66 y.o. male.   Chief Complaint: Nasopharyngeal papilloma HPI: 66 year old male with known papilloma on superior surface of soft palate but more recently with weight loss and PET scan demonstrating activity in nasopharynx.   Presents for surgical excision.  Past Medical History:  Diagnosis Date  . AAA (abdominal aortic aneurysm) ([place])    infrarenal AAA  . Aortic aneurysm ([place])   . Arthritis   . Benign prostatic hyperplasia   . COPD (chronic obstructive pulmonary disease) ([place])   . Depression   . Dyspnea    on exertion  . GERD (gastroesophageal reflux disease)   . Headache   . Hypertension   . Myocardial infarction ([place]) 1980's  . On home oxygen therapy    "2l; 24/7 when I'm home" (07/27/2016)  . Panlobular emphysema ([place])   . Pneumonia    9/18  . Prostate disorder     Past Surgical History:  Procedure Laterality Date  . ABDOMINAL AORTIC ENDOVASCULAR STENT GRAFT N/A 07/27/2016   Procedure: ABDOMINAL AORTIC ENDOVASCULAR STENT GRAFT;  Surgeon: Angelia Mould, MD;  Location: [facility];  Service: Vascular;  Laterality: N/A;  . BRAIN SURGERY  Jul 14, 2013   Mass front Left side  . CARDIAC CATHETERIZATION  1980's  . LAPAROTOMY  1970's   ulcer ruptured    Family History  Problem Relation Age of Onset  . COPD Sister    Social History:  reports that he has been smoking cigarettes.  He has a 40.00 pack-year smoking history. He has quit using smokeless tobacco. His smokeless tobacco use included snuff and chew. He reports that he does not drink alcohol or use drugs.  Allergies:  Allergies  Allergen Reactions  . Ativan [Lorazepam] Other (See Comments)    hallucinations    Medications Prior to Admission  Medication Sig Dispense Refill  . albuterol (PROVENTIL HFA;VENTOLIN HFA) 108 (90 Base) MCG/ACT inhaler Inhale 2 puffs into the lungs every 6 (six) hours as needed for wheezing or shortness of breath.    . ALPRAZolam (XANAX) 1 MG tablet Take 1-2 mg at  bedtime as needed by mouth for anxiety (sleep). Take 1 tablet (1 mg) by mouth daily at bedtime, may also take 1/2-1 tablet (0.5 - 1 mg) during the day as needed for anxiety     . Ascorbic Acid (VITAMIN C PO) Take 1 tablet daily by mouth.    Marland Kitchen aspirin EC 81 MG tablet Take 81 mg by mouth daily.    . budesonide-formoterol (SYMBICORT) 160-4.5 MCG/ACT inhaler Inhale 1 puff into the lungs 2 (two) times daily.     . butalbital-acetaminophen-caffeine (FIORICET, ESGIC) 50-325-40 MG tablet Take 2 tablets every 4 (four) hours as needed by mouth for headache.     . Cholecalciferol (D 1000) 1000 units capsule Take 1,000 Units by mouth daily.     . colchicine 0.6 MG tablet Take 0.6 mg by mouth every 6 (six) hours as needed (gout flares). Mitigare    . Cyanocobalamin (VITAMIN B-12 PO) Take 1 tablet daily by mouth.    . escitalopram (LEXAPRO) 20 MG tablet Take 20 mg by mouth daily.    . finasteride (PROSCAR) 5 MG tablet Take 5 mg by mouth at bedtime.   3  . ipratropium-albuterol (DUONEB) 0.5-2.5 (3) MG/3ML SOLN Inhale 3 mLs into the lungs every 6 (six) hours as needed (shortness of breath/wheezing).     Marland Kitchen lisinopril (PRINIVIL,ZESTRIL) 10 MG tablet Take 10 mg by mouth at bedtime.    Marland Kitchen  OXYGEN Inhale 3 L into the lungs continuous.    . pantoprazole (PROTONIX) 40 MG tablet Take 40 mg by mouth daily. Received from [facility]    . pravastatin (PRAVACHOL) 20 MG tablet Take 1 tablet (20 mg total) by mouth at bedtime. Received from [facility] 90 tablet 3  . tamsulosin (FLOMAX) 0.4 MG CAPS capsule Take 0.4 mg by mouth daily. Received from [facility]     . Tiotropium Bromide Monohydrate (SPIRIVA RESPIMAT) 2.5 MCG/ACT AERS Inhale 1 puff into the lungs daily.      Results for orders placed or performed during the hospital encounter of 01/29/17 (from the past 48 hour(s))  I-STAT 4, (NA,K, GLUC, HGB,HCT)     Status: Abnormal   Collection Time: 01/29/17  6:42 AM  Result Value Ref Range   Sodium 139 135 - 145 mmol/L    Potassium 4.2 3.5 - 5.1 mmol/L   Glucose, Bld 115 (H) 65 - 99 mg/dL   HCT 39.0 39.0 - 52.0 %   Hemoglobin 13.3 13.0 - 17.0 g/dL   No results found.  Review of Systems  All other systems reviewed and are negative.   Blood pressure 124/79, pulse 82, temperature 98 F (36.7 C), temperature source Oral, resp. rate 18, weight 130 lb (59 kg), SpO2 95 %. Physical Exam  Constitutional: He is oriented to person, place, and time. He appears well-developed and well-nourished. No distress.  HENT:  Head: Normocephalic and atraumatic.  Right Ear: External ear normal.  Left Ear: External ear normal.  Nose: Nose normal.  Mouth/Throat: Oropharynx is clear and moist.  Eyes: Conjunctivae and EOM are normal. Pupils are equal, round, and reactive to light.  Neck: Normal range of motion. Neck supple.  Cardiovascular: Normal rate.  Respiratory: Effort normal.  Musculoskeletal: Normal range of motion.  Neurological: He is alert and oriented to person, place, and time. No cranial nerve deficit.  Skin: Skin is warm and dry.  Psychiatric: He has a normal mood and affect. His behavior is normal. Judgment and thought content normal.     Assessment/Plan Nasopharyngeal papilloma To OR for excision of papilloma.  Melida Quitter, MD 01/29/2017, 7:25 AM

## 2017-01-29 NOTE — Anesthesia Procedure Notes (Signed)
Procedure Name: Intubation Date/Time: 01/29/2017 7:54 AM Performed by: Shirlyn Goltz, CRNA Pre-anesthesia Checklist: Patient identified, Emergency Drugs available, Suction available and Patient being monitored Patient Re-evaluated:Patient Re-evaluated prior to induction Oxygen Delivery Method: Circle system utilized Preoxygenation: Pre-oxygenation with 100% oxygen Induction Type: IV induction Ventilation: Mask ventilation without difficulty, Two handed mask ventilation required and Oral airway inserted - appropriate to patient size Laryngoscope Size: Mac and 4 Grade View: Grade I Tube type: Oral Tube size: 7.5 mm Number of attempts: 1 Airway Equipment and Method: Stylet Placement Confirmation: ETT inserted through vocal cords under direct vision,  positive ETCO2 and breath sounds checked- equal and bilateral Secured at: 19 cm Tube secured with: Tape Dental Injury: Teeth and Oropharynx as per pre-operative assessment

## 2017-01-29 NOTE — Op Note (Signed)
NAMEMarland Kitchen  LINDELL, RENFREW NO.:  [id number]  MEDICAL RECORD NO.:  [id number]  LOCATION:                               FACILITY:  [facility]  PHYSICIAN:  Onnie Graham, MD     DATE OF BIRTH:  [birth year]  DATE OF PROCEDURE:  01/29/2017 DATE OF DISCHARGE:  01/30/2017                              OPERATIVE REPORT   PREOPERATIVE DIAGNOSIS:  Superior soft palate papilloma.  POSTOPERATIVE DIAGNOSIS:  Superior soft palate papilloma.  PROCEDURE:  Transnasal and transoral resection of superior soft palate papilloma.  SURGEON:  Onnie Graham, MD.  ANESTHESIA:  General endotracheal anesthesia.  COMPLICATIONS:  None.  INDICATION:  The patient is a 66 year old male with a superior soft palate papilloma for at least a couple of years that has not caused him any symptoms.  Previous biopsy was benign.  In recent months, however, he has been losing weight without known reason and a PET scan recently was performed that showed activity in the nasopharynx.  Thus, he presents to the operative room for excisional biopsy.  FINDINGS:  There was sessile papilloma covering the entirety of the superior soft palate from the eustachian tube opening on one side to the other and extending to the posterior choana.  The bulk of the papilloma was excised using a microdebrider and specimen was sent for pathology.  DESCRIPTION OF PROCEDURE:  The patient was identified in the holding room and informed consent having been obtained including discussion of risks, benefits, and alternatives, the patient was brought to the operative suite and put on the operative table in supine position. Anesthesia was induced and the patient was intubated by the Anesthesia team without difficulty.  The eyes taped closed and then the face was prepped and draped in a sterile fashion.  Afrin-soaked pledgets were placed in both sides of the nose for several minutes and then removed. A 0-degree telescope was used to view  the nasopharynx through the nasal passage and local anesthetic was injected using a spinal needle into the superior soft palate papilloma.  The straight microdebrider was then used through the nasal passages to debride soft palate papilloma on the superior surface of the soft palate.  This was done with the scope in the same side as a microdebrider as well as on the opposite side with the microdebrider.  An angled telescope was then used to help with visualization as the papilloma was removed more laterally near the eustachian tube openings on each side.  The limits of this approach were then reached and so the telescope was removed and the bed was turned 90 degrees from anesthesia.  The Crowe-Davis retractor was inserted in the mouth and opened via the oropharynx.  This was placed in suspension on Mayo stand.  A red rubber catheter was passed through the right nasal passage and pulled through the mouth to provide anterior traction of the soft palate.  A mirror was then used into visualize the superior soft palate and a curved microdebrider blade was then used to remove the remaining papilloma visualized on the superior soft palate.  The red rubber catheter was switched on sides in order to allow further debridement  of the soft palate.  After this was completed, Afrin-soaked tonsil packs were placed in the nasopharynx and a Crowe-Davis retractor was taken out of suspension.  After a few minutes, the packs were removed and the patient was placed back in suspension.  There was too much bleeding at this point to feel comfortable with proceeding with wake-up, so the red rubber catheter was replaced and the suction cautery was then used to cauterize bleeding sites laterally on either side of the soft palate using the mirror.  After this was completed, bleeding was minimal.  The Afrin packs were placed for another few minutes and then removed.  The Crowe-Davis retractor was taken out of  suspension and removed from the patient's mouth.  He was then turned back to Anesthesia for wake-up and was extubated, moved to the recovery room in stable condition.     Onnie Graham, MD     DDB/MEDQ  D:  01/29/2017  T:  01/29/2017  Job:  [id number]  cc:   Onnie Graham, MD's office

## 2017-01-30 ENCOUNTER — Encounter (HOSPITAL_COMMUNITY): Payer: Self-pay | Admitting: Otolaryngology

## 2017-01-30 DIAGNOSIS — D0008 Carcinoma in situ of pharynx: Secondary | ICD-10-CM | POA: Diagnosis not present

## 2017-01-30 MED ORDER — HYDROCODONE-ACETAMINOPHEN 5-325 MG PO TABS: 1.0000 | ORAL_TABLET | Freq: Four times a day (QID) | ORAL | 0 refills | Status: DC | PRN

## 2017-01-30 NOTE — Progress Notes (Signed)
Jay Mullins to be D/C'd Home  per MD order.  Discussed prescriptions and follow up appointments with the patient. Prescriptions given to patient, medication list explained in detail. Pt verbalized understanding.  Allergies as of 01/30/2017      Reactions   Ativan [lorazepam] Other (See Comments)   hallucinations      Medication List    TAKE these medications   albuterol 108 (90 Base) MCG/ACT inhaler Commonly known as:  PROVENTIL HFA;VENTOLIN HFA Inhale 2 puffs into the lungs every 6 (six) hours as needed for wheezing or shortness of breath.   ALPRAZolam 1 MG tablet Commonly known as:  XANAX Take 1-2 mg at bedtime as needed by mouth for anxiety (sleep). Take 1 tablet (1 mg) by mouth daily at bedtime, may also take 1/2-1 tablet (0.5 - 1 mg) during the day as needed for anxiety   aspirin EC 81 MG tablet Take 81 mg by mouth daily.   butalbital-acetaminophen-caffeine 50-325-40 MG tablet Commonly known as:  FIORICET, ESGIC Take 2 tablets every 4 (four) hours as needed by mouth for headache.   colchicine 0.6 MG tablet Take 0.6 mg by mouth every 6 (six) hours as needed (gout flares). Mitigare   D 1000 1000 units capsule Generic drug:  Cholecalciferol Take 1,000 Units by mouth daily.   escitalopram 20 MG tablet Commonly known as:  LEXAPRO Take 20 mg by mouth daily.   finasteride 5 MG tablet Commonly known as:  PROSCAR Take 5 mg by mouth at bedtime.   HYDROcodone-acetaminophen 5-325 MG tablet Commonly known as:  NORCO/VICODIN Take 1-2 tablets by mouth every 6 (six) hours as needed for moderate pain.   ipratropium-albuterol 0.5-2.5 (3) MG/3ML Soln Commonly known as:  DUONEB Inhale 3 mLs into the lungs every 6 (six) hours as needed (shortness of breath/wheezing).   lisinopril 10 MG tablet Commonly known as:  PRINIVIL,ZESTRIL Take 10 mg by mouth at bedtime.   OXYGEN Inhale 3 L into the lungs continuous.   pantoprazole 40 MG tablet Commonly known as:  PROTONIX Take 40 mg  by mouth daily. Received from [place]   pravastatin 20 MG tablet Commonly known as:  PRAVACHOL Take 1 tablet (20 mg total) by mouth at bedtime. Received from [place] [facility] 2.5 MCG/ACT Aers Generic drug:  Tiotropium Bromide Monohydrate Inhale 1 puff into the lungs daily.   SYMBICORT 160-4.5 MCG/ACT inhaler Generic drug:  budesonide-formoterol Inhale 1 puff into the lungs 2 (two) times daily.   tamsulosin 0.4 MG Caps capsule Commonly known as:  FLOMAX Take 0.4 mg by mouth daily. Received from [place] B-12 PO Take 1 tablet daily by mouth.   VITAMIN C PO Take 1 tablet daily by mouth.       Vitals:   01/30/17 0848 01/30/17 0948  BP:  129/68  Pulse:  (!) 117  Resp:  18  Temp:  98.3 F (36.8 C)  SpO2: (!) 88% 90%    Skin clean, dry and intact without evidence of skin break down, no evidence of skin tears noted. IV catheter discontinued intact. Site without signs and symptoms of complications. Dressing and pressure applied. Pt denies pain at this time. No complaints noted.  An After Visit Summary was printed and given to the patient. Patient escorted via [place], and D/C home via private auto.  Chapman Fitch BSN, RN [facility] 5MW Phone [phone]

## 2017-01-30 NOTE — Progress Notes (Signed)
Patient found with SpO2 of 85% on 4 L [place]. Patient in no respiratory distress at the time and was alert and oriented. Liter flow increased to 6 L with mild improvement to 88%. PRN HHN given with SpO2 currently 96% during treatment. Will place patient back on 6 L and recheck SpO2 in approximately 30 mins.

## 2017-01-30 NOTE — Care Management Obs Status (Signed)
[place] NOTIFICATION   Patient Details  Name: Jay Mullins MRN: [id number] Date of Birth: February 08, 1951   Medicare Observation Status Notification Given:  Yes    Carles Collet, RN 01/30/2017, 11:11 AM

## 2017-01-30 NOTE — Discharge Summary (Signed)
Physician Discharge Summary  Patient ID: Jay Mullins MRN: [id number] DOB/AGE: 1950/08/12 66 y.o.  Admit date: 01/29/2017 Discharge date: 01/30/2017  Admission Diagnoses: Nasopharynx papilloma  Discharge Diagnoses:  Active Problems:   Papilloma of nasopharynx   Discharged Condition: good  Hospital Course: 66 year old male with papilloma on superior surface of soft palate for a few years.  Recently, he has lost weight without known reason and a PET scan showed activity in the palate.  He presented for surgical management.  See operative note.  He was observed overnight after surgery and did quite well with only minor nose bleeding.  He was able to eat and drink well.  On POD 1, he is felt stable for discharge.  Consults: None  Significant Diagnostic Studies: None  Treatments: surgery: Excision of nasopharyngeal papilloma  Discharge Exam: Blood pressure (!) 115/57, pulse 75, temperature 98.7 F (37.1 C), temperature source Oral, resp. rate 18, height 5\' 9"  (1.753 m), weight 130 lb 8.2 oz (59.2 kg), SpO2 98 %. General appearance: alert, cooperative and no distress Throat: no bleeding.  slight hypernasal speech.  Disposition: 01-Home or Self Care  Discharge Instructions    Diet - low sodium heart healthy   Complete by:  As directed    Discharge instructions   Complete by:  As directed    Rinse and gargle with salt water after meals, if needed.  Saline spray through each side of the nose several times per day may be helpful.  Resume normal diet and activity.  Tylenol and/or Motrin for pain.   Increase activity slowly   Complete by:  As directed      Allergies as of 01/30/2017      Reactions   Ativan [lorazepam] Other (See Comments)   hallucinations      Medication List    TAKE these medications   albuterol 108 (90 Base) MCG/ACT inhaler Commonly known as:  PROVENTIL HFA;VENTOLIN HFA Inhale 2 puffs into the lungs every 6 (six) hours as needed for wheezing or shortness of  breath.   ALPRAZolam 1 MG tablet Commonly known as:  XANAX Take 1-2 mg at bedtime as needed by mouth for anxiety (sleep). Take 1 tablet (1 mg) by mouth daily at bedtime, may also take 1/2-1 tablet (0.5 - 1 mg) during the day as needed for anxiety   aspirin EC 81 MG tablet Take 81 mg by mouth daily.   butalbital-acetaminophen-caffeine 50-325-40 MG tablet Commonly known as:  FIORICET, ESGIC Take 2 tablets every 4 (four) hours as needed by mouth for headache.   colchicine 0.6 MG tablet Take 0.6 mg by mouth every 6 (six) hours as needed (gout flares). Mitigare   D 1000 1000 units capsule Generic drug:  Cholecalciferol Take 1,000 Units by mouth daily.   escitalopram 20 MG tablet Commonly known as:  LEXAPRO Take 20 mg by mouth daily.   finasteride 5 MG tablet Commonly known as:  PROSCAR Take 5 mg by mouth at bedtime.   ipratropium-albuterol 0.5-2.5 (3) MG/3ML Soln Commonly known as:  DUONEB Inhale 3 mLs into the lungs every 6 (six) hours as needed (shortness of breath/wheezing).   lisinopril 10 MG tablet Commonly known as:  PRINIVIL,ZESTRIL Take 10 mg by mouth at bedtime.   OXYGEN Inhale 3 L into the lungs continuous.   pantoprazole 40 MG tablet Commonly known as:  PROTONIX Take 40 mg by mouth daily. Received from [facility]   pravastatin 20 MG tablet Commonly known as:  PRAVACHOL Take 1 tablet (20  mg total) by mouth at bedtime. Received from [facility] 2.5 MCG/ACT Aers Generic drug:  Tiotropium Bromide Monohydrate Inhale 1 puff into the lungs daily.   SYMBICORT 160-4.5 MCG/ACT inhaler Generic drug:  budesonide-formoterol Inhale 1 puff into the lungs 2 (two) times daily.   tamsulosin 0.4 MG Caps capsule Commonly known as:  FLOMAX Take 0.4 mg by mouth daily. Received from [facility] B-12 PO Take 1 tablet daily by mouth.   VITAMIN C PO Take 1 tablet daily by mouth.        SignedRedmond Baseman, Sylvanus Telford 01/30/2017, 7:51 AM

## 2017-03-07 ENCOUNTER — Other Ambulatory Visit: Payer: Self-pay

## 2017-03-07 ENCOUNTER — Other Ambulatory Visit: Payer: Self-pay | Admitting: Vascular Surgery

## 2017-03-07 ENCOUNTER — Ambulatory Visit
Admission: RE | Admit: 2017-03-07 | Discharge: 2017-03-07 | Disposition: A | Discharge status: Home / Self Care | Payer: Medicare Other | Source: Ambulatory Visit | Attending: Vascular Surgery | Admitting: Vascular Surgery

## 2017-03-07 ENCOUNTER — Ambulatory Visit (INDEPENDENT_AMBULATORY_CARE_PROVIDER_SITE_OTHER): Payer: Medicare Other | Admitting: Vascular Surgery

## 2017-03-07 VITALS — BP 109/60 | HR 87 | Temp 97.0°F | Resp 18 | Ht 69.0 in | Wt 127.1 lb

## 2017-03-07 DIAGNOSIS — Z48812 Encounter for surgical aftercare following surgery on the circulatory system: Secondary | ICD-10-CM

## 2017-03-07 DIAGNOSIS — I714 Abdominal aortic aneurysm, without rupture, unspecified: Secondary | ICD-10-CM

## 2017-03-07 DIAGNOSIS — Z8679 Personal history of other diseases of the circulatory system: Secondary | ICD-10-CM

## 2017-03-07 MED ORDER — IOPAMIDOL (ISOVUE-370) INJECTION 76%: 75.0000 mL | Freq: Once | INTRAVENOUS | Status: DC | PRN

## 2017-03-07 MED ORDER — IOPAMIDOL (ISOVUE-370) INJECTION 76%
75.0000 mL | Freq: Once | INTRAVENOUS | Status: AC | PRN
  Administered 2017-03-07: 75 mL via INTRAVENOUS

## 2017-03-07 NOTE — Progress Notes (Signed)
Patient name: Jay Mullins MRN: [id number] DOB: [birth year] Sex: male  REASON FOR VISIT:   Follow-up after endovascular aneurysm repair  HPI:   Jay Mullins is a pleasant 67 y.o. male who underwent a percutaneous endovascular aneurysm repair using 3 components on 07/27/2016.  This was for a 5.6 cm infrarenal abdominal aortic aneurysm.  On his 1 month follow-up scan there was a type II endoleak from a patent lumbar branch.  I ordered a follow-up CT scan in 6 months.  He comes in for that follow-up visit.  Since I saw him last, he denies any abdominal pain or back pain.  There have been no significant changes to his medical history.  Past Medical History:  Diagnosis Date  . AAA (abdominal aortic aneurysm) ([place])    infrarenal AAA  . Aortic aneurysm ([place])   . Arthritis   . Benign prostatic hyperplasia   . COPD (chronic obstructive pulmonary disease) ([place])   . Depression   . Dyspnea    on exertion  . GERD (gastroesophageal reflux disease)   . Headache   . Hypertension   . Myocardial infarction ([place]) 1980's  . On home oxygen therapy    "2l; 24/7 when I'm home" (07/27/2016)  . Panlobular emphysema ([place])   . Pneumonia    9/18  . Prostate disorder     Family History  Problem Relation Age of Onset  . COPD Sister     SOCIAL HISTORY: Social History   Tobacco Use  . Smoking status: Current Every Day Smoker    Packs/day: 1.00    Years: 40.00    Pack years: 40.00    Types: Cigarettes  . Smokeless tobacco: Former Systems developer    Types: Snuff, Chew  . Tobacco comment: 11/18/16: smokes "at least a pack per day" 07/27/2016 "quit chew/snuff when I was a kid"  Substance Use Topics  . Alcohol use: No    Alcohol/week: 0.0 oz    Allergies  Allergen Reactions  . Ativan [Lorazepam] Other (See Comments)    hallucinations    Current Outpatient Medications  Medication Sig Dispense Refill  . albuterol (PROVENTIL HFA;VENTOLIN HFA) 108 (90 Base) MCG/ACT inhaler Inhale 2 puffs into the lungs  every 6 (six) hours as needed for wheezing or shortness of breath.    . ALPRAZolam (XANAX) 1 MG tablet Take 1-2 mg at bedtime as needed by mouth for anxiety (sleep). Take 1 tablet (1 mg) by mouth daily at bedtime, may also take 1/2-1 tablet (0.5 - 1 mg) during the day as needed for anxiety     . Ascorbic Acid (VITAMIN C PO) Take 1 tablet daily by mouth.    Marland Kitchen aspirin EC 81 MG tablet Take 81 mg by mouth daily.    . budesonide-formoterol (SYMBICORT) 160-4.5 MCG/ACT inhaler Inhale 1 puff into the lungs 2 (two) times daily.     . butalbital-acetaminophen-caffeine (FIORICET, ESGIC) 50-325-40 MG tablet Take 2 tablets every 4 (four) hours as needed by mouth for headache.     . Cholecalciferol (D 1000) 1000 units capsule Take 1,000 Units by mouth daily.     . colchicine 0.6 MG tablet Take 0.6 mg by mouth every 6 (six) hours as needed (gout flares). Mitigare    . Cyanocobalamin (VITAMIN B-12 PO) Take 1 tablet daily by mouth.    . escitalopram (LEXAPRO) 20 MG tablet Take 20 mg by mouth daily.    . finasteride (PROSCAR) 5 MG tablet Take 5 mg by mouth at bedtime.   3  .  ipratropium-albuterol (DUONEB) 0.5-2.5 (3) MG/3ML SOLN Inhale 3 mLs into the lungs every 6 (six) hours as needed (shortness of breath/wheezing).     Marland Kitchen lisinopril (PRINIVIL,ZESTRIL) 10 MG tablet Take 10 mg by mouth at bedtime.    . OXYGEN Inhale 3 L into the lungs continuous.    . pantoprazole (PROTONIX) 40 MG tablet Take 40 mg by mouth daily. Received from [facility]    . pravastatin (PRAVACHOL) 20 MG tablet Take 1 tablet (20 mg total) by mouth at bedtime. Received from [facility] 90 tablet 3  . tamsulosin (FLOMAX) 0.4 MG CAPS capsule Take 0.4 mg by mouth daily. Received from [facility]     . Tiotropium Bromide Monohydrate (SPIRIVA RESPIMAT) 2.5 MCG/ACT AERS Inhale 1 puff into the lungs daily.    Marland Kitchen HYDROcodone-acetaminophen (NORCO/VICODIN) 5-325 MG tablet Take 1-2 tablets by mouth every 6 (six) hours as needed for moderate pain.  (Patient not taking: Reported on 03/07/2017) 12 tablet 0   No current facility-administered medications for this visit.     REVIEW OF SYSTEMS:  [X]  denotes positive finding, [ ]  denotes negative finding Cardiac  Comments:  Chest pain or chest pressure:    Shortness of breath upon exertion:    Short of breath when lying flat:    Irregular heart rhythm:        Vascular    Pain in calf, thigh, or hip brought on by ambulation:    Pain in feet at night that wakes you up from your sleep:     Blood clot in your veins:    Leg swelling:         Pulmonary    Oxygen at home:    Productive cough:     Wheezing:         Neurologic    Sudden weakness in arms or legs:     Sudden numbness in arms or legs:     Sudden onset of difficulty speaking or slurred speech:    Temporary loss of vision in one eye:     Problems with dizziness:         Gastrointestinal    Blood in stool:     Vomited blood:         Genitourinary    Burning when urinating:     Blood in urine:        Psychiatric    Major depression:         Hematologic    Bleeding problems:    Problems with blood clotting too easily:        Skin    Rashes or ulcers:        Constitutional    Fever or chills:     PHYSICAL EXAM:   Vitals:   03/07/17 1334  BP: 109/60  Pulse: 87  Resp: 18  Temp: (!) 97 F (36.1 C)  TempSrc: Oral  SpO2: 91%  Weight: 127 lb 1.6 oz (57.7 kg)  Height: 5\' 9"  (1.753 m)    GENERAL: The patient is a well-nourished male, in no acute distress. The vital signs are documented above. CARDIAC: There is a regular rate and rhythm.  VASCULAR: I do not detect carotid bruits. He has palpable femoral and posterior tibial pulses bilaterally. PULMONARY: There is good air exchange bilaterally without wheezing or rales. ABDOMEN: Soft and non-tender with normal pitched bowel sounds.  MUSCULOSKELETAL: There are no major deformities or cyanosis. NEUROLOGIC: No focal weakness or paresthesias are  detected. SKIN: There are no ulcers  or rashes noted. PSYCHIATRIC: The patient has a normal affect.  DATA:    CT ANGIOGRAM ABDOMEN AND PELVIS: I reviewed his CT angiogram that was done today.  This shows that the type II endoleak has resolved.  The aneurysm is decreased in size to 4.5 cm.  MEDICAL ISSUES:   STATUS POST ENDOVASCULAR ANEURYSM REPAIR: The patient is doing well status post endovascular aneurysm repair.  Follow-up CT scan today shows no type II endoleak.  The aneurysm has decreased in size to 4.5 cm.  This reason I think we can just go to a yearly ultrasound to follow his aneurysm.  I have ordered a duplex scan in 1 year and I will see him back at that time.  He knows to call sooner if he has problems.  He is on aspirin and is on a statin.  We have again discussed the importance of tobacco cessation.  Deitra Mayo [facility] [phone]

## 2017-04-24 ENCOUNTER — Telehealth: Payer: Self-pay | Admitting: Internal Medicine

## 2017-04-24 NOTE — Telephone Encounter (Signed)
No arrhythmias have been detected in past  Agree with encouraging fluid intake  If HR remains fast should come in for EKG and BP check Thursday or I can see on Friday  Call primary if cough or fever

## 2017-04-24 NOTE — Telephone Encounter (Signed)
Left message to call back  

## 2017-04-24 NOTE — Telephone Encounter (Signed)
New message    [name] calling with concerns about HR. No chest pain. No shortness of breath. Wears 3L o2   STAT if HR is under 50 or over 120 (normal HR is 60-100 beats per minute)  1) What is your heart rate? 116  2) Do you have a log of your heart rate readings (document readings)? 100-->116  3) Do you have any other symptoms? NO

## 2017-04-24 NOTE — Telephone Encounter (Signed)
HR 100-116 last 2 days Usually 70s Denies pain Using oximeter.  Pulse ox reading 98 %, HR 102 currently Wearing O2 3.5 L [place] No appetite lately  Not drinking much.  Coffee mostly.  Using albuterol in nebulizer 3-4 times every day. Encouraged pt to drink water over the next few days and avoid caffeine.  Told him could try Gatorade or V8 also.   Advised to call back in couple days with update on his HR and sooner if needed.

## 2017-04-26 NOTE — Telephone Encounter (Signed)
Called and spoke with Jay Mullins (DPR). Pt's hr has been staying in 90s mostly, up to low 100s at times.  Drinking more water, less coffee. Advised to call back if anything further is needed.  Advised to call PCP for fever or cough.  She is apprecitaive for the call back.

## 2017-06-01 ENCOUNTER — Telehealth: Payer: Self-pay | Admitting: Cardiology

## 2017-06-01 NOTE — Telephone Encounter (Signed)
I contacted the patient's caretaker, [name] after request via operator.  Ms Laurance Flatten reports that she was attempting to give the patient his xanax when she accidentally gave him 5 mg of flecainide (that is prescribed to her). She asks what should be done. I gave the caretaker the number for poison control, who can help determine what follow up may be needed.

## 2017-08-17 ENCOUNTER — Encounter (HOSPITAL_COMMUNITY): Payer: Self-pay

## 2017-08-17 ENCOUNTER — Emergency Department (HOSPITAL_COMMUNITY): Payer: Medicare Other

## 2017-08-17 ENCOUNTER — Other Ambulatory Visit: Payer: Self-pay

## 2017-08-17 ENCOUNTER — Inpatient Hospital Stay (HOSPITAL_COMMUNITY)
Admission: EM | Admit: 2017-08-17 | Discharge: 2017-08-23 | DRG: 190 | Disposition: A | Discharge status: Home Health | Payer: Medicare Other | Attending: Internal Medicine | Admitting: Internal Medicine

## 2017-08-17 DIAGNOSIS — Z7189 Other specified counseling: Secondary | ICD-10-CM

## 2017-08-17 DIAGNOSIS — Z87891 Personal history of nicotine dependence: Secondary | ICD-10-CM

## 2017-08-17 DIAGNOSIS — Z66 Do not resuscitate: Secondary | ICD-10-CM | POA: Diagnosis not present

## 2017-08-17 DIAGNOSIS — I252 Old myocardial infarction: Secondary | ICD-10-CM | POA: Diagnosis not present

## 2017-08-17 DIAGNOSIS — Z825 Family history of asthma and other chronic lower respiratory diseases: Secondary | ICD-10-CM | POA: Diagnosis not present

## 2017-08-17 DIAGNOSIS — M1990 Unspecified osteoarthritis, unspecified site: Secondary | ICD-10-CM | POA: Diagnosis present

## 2017-08-17 DIAGNOSIS — J181 Lobar pneumonia, unspecified organism: Secondary | ICD-10-CM | POA: Diagnosis present

## 2017-08-17 DIAGNOSIS — J9621 Acute and chronic respiratory failure with hypoxia: Secondary | ICD-10-CM | POA: Diagnosis present

## 2017-08-17 DIAGNOSIS — F419 Anxiety disorder, unspecified: Secondary | ICD-10-CM | POA: Diagnosis not present

## 2017-08-17 DIAGNOSIS — Z9981 Dependence on supplemental oxygen: Secondary | ICD-10-CM | POA: Diagnosis not present

## 2017-08-17 DIAGNOSIS — N400 Enlarged prostate without lower urinary tract symptoms: Secondary | ICD-10-CM | POA: Diagnosis present

## 2017-08-17 DIAGNOSIS — I493 Ventricular premature depolarization: Secondary | ICD-10-CM | POA: Diagnosis present

## 2017-08-17 DIAGNOSIS — K219 Gastro-esophageal reflux disease without esophagitis: Secondary | ICD-10-CM | POA: Diagnosis present

## 2017-08-17 DIAGNOSIS — R0602 Shortness of breath: Secondary | ICD-10-CM | POA: Diagnosis present

## 2017-08-17 DIAGNOSIS — J189 Pneumonia, unspecified organism: Secondary | ICD-10-CM

## 2017-08-17 DIAGNOSIS — Z7982 Long term (current) use of aspirin: Secondary | ICD-10-CM

## 2017-08-17 DIAGNOSIS — J9622 Acute and chronic respiratory failure with hypercapnia: Secondary | ICD-10-CM | POA: Diagnosis present

## 2017-08-17 DIAGNOSIS — Z7951 Long term (current) use of inhaled steroids: Secondary | ICD-10-CM

## 2017-08-17 DIAGNOSIS — I444 Left anterior fascicular block: Secondary | ICD-10-CM | POA: Diagnosis present

## 2017-08-17 DIAGNOSIS — E43 Unspecified severe protein-calorie malnutrition: Secondary | ICD-10-CM | POA: Diagnosis present

## 2017-08-17 DIAGNOSIS — F411 Generalized anxiety disorder: Secondary | ICD-10-CM | POA: Diagnosis present

## 2017-08-17 DIAGNOSIS — Z888 Allergy status to other drugs, medicaments and biological substances status: Secondary | ICD-10-CM | POA: Diagnosis not present

## 2017-08-17 DIAGNOSIS — J440 Chronic obstructive pulmonary disease with acute lower respiratory infection: Secondary | ICD-10-CM | POA: Diagnosis present

## 2017-08-17 DIAGNOSIS — Z79899 Other long term (current) drug therapy: Secondary | ICD-10-CM | POA: Diagnosis not present

## 2017-08-17 DIAGNOSIS — R0902 Hypoxemia: Secondary | ICD-10-CM

## 2017-08-17 DIAGNOSIS — Z8679 Personal history of other diseases of the circulatory system: Secondary | ICD-10-CM | POA: Diagnosis not present

## 2017-08-17 DIAGNOSIS — I959 Hypotension, unspecified: Secondary | ICD-10-CM

## 2017-08-17 DIAGNOSIS — Z681 Body mass index (BMI) 19 or less, adult: Secondary | ICD-10-CM

## 2017-08-17 DIAGNOSIS — I10 Essential (primary) hypertension: Secondary | ICD-10-CM | POA: Diagnosis present

## 2017-08-17 DIAGNOSIS — J441 Chronic obstructive pulmonary disease with (acute) exacerbation: Secondary | ICD-10-CM | POA: Diagnosis not present

## 2017-08-17 DIAGNOSIS — Z515 Encounter for palliative care: Secondary | ICD-10-CM

## 2017-08-17 LAB — BLOOD GAS, ARTERIAL
ACID-BASE EXCESS: 5.7 mmol/L — AB (ref 0.0–2.0)
Bicarbonate: 28.5 mmol/L — ABNORMAL HIGH (ref 20.0–28.0)
DELIVERY SYSTEMS: POSITIVE
Drawn by: 270161
EXPIRATORY PAP: 5
FIO2: 40
Inspiratory PAP: 15
O2 Saturation: 96.1 %
PH ART: 7.325 — AB (ref 7.350–7.450)
Patient temperature: 37
pCO2 arterial: 62.1 mmHg — ABNORMAL HIGH (ref 32.0–48.0)
pO2, Arterial: 92.1 mmHg (ref 83.0–108.0)

## 2017-08-17 LAB — COMPREHENSIVE METABOLIC PANEL
ALK PHOS: 65 U/L (ref 38–126)
ALT: 11 U/L — AB (ref 17–63)
AST: 11 U/L — ABNORMAL LOW (ref 15–41)
Albumin: 3.6 g/dL (ref 3.5–5.0)
Anion gap: 10 (ref 5–15)
BILIRUBIN TOTAL: 0.9 mg/dL (ref 0.3–1.2)
BUN: 20 mg/dL (ref 6–20)
CO2: 36 mmol/L — ABNORMAL HIGH (ref 22–32)
CREATININE: 0.72 mg/dL (ref 0.61–1.24)
Calcium: 9.3 mg/dL (ref 8.9–10.3)
Chloride: 93 mmol/L — ABNORMAL LOW (ref 101–111)
GFR calc Af Amer: >60 mL/min (ref >60)
GLUCOSE: 137 mg/dL — AB (ref 65–99)
Potassium: 4.1 mmol/L (ref 3.5–5.1)
Sodium: 139 mmol/L (ref 135–145)
TOTAL PROTEIN: 6.9 g/dL (ref 6.5–8.1)

## 2017-08-17 LAB — CBC WITH DIFFERENTIAL/PLATELET
Basophils Absolute: 0 10*3/uL (ref 0.0–0.1)
Basophils Relative: 0 %
Eosinophils Absolute: 0 10*3/uL (ref 0.0–0.7)
Eosinophils Relative: 0 %
HEMATOCRIT: 40.2 % (ref 39.0–52.0)
HEMOGLOBIN: 12.4 g/dL — AB (ref 13.0–17.0)
LYMPHS PCT: 3 %
Lymphs Abs: 0.6 10*3/uL — ABNORMAL LOW (ref 0.7–4.0)
MCH: 29.1 pg (ref 26.0–34.0)
MCHC: 30.8 g/dL (ref 30.0–36.0)
MCV: 94.4 fL (ref 78.0–100.0)
MONO ABS: 1.4 10*3/uL — AB (ref 0.1–1.0)
Monocytes Relative: 8 %
NEUTROS ABS: 15.7 10*3/uL — AB (ref 1.7–7.7)
NEUTROS PCT: 89 %
Platelets: 269 10*3/uL (ref 150–400)
RBC: 4.26 MIL/uL (ref 4.22–5.81)
RDW: 15 % (ref 11.5–15.5)
WBC: 17.7 10*3/uL — AB (ref 4.0–10.5)

## 2017-08-17 LAB — I-STAT CG4 LACTIC ACID, ED: Lactic Acid, Venous: 1.64 mmol/L (ref 0.5–1.9)

## 2017-08-17 LAB — MRSA PCR SCREENING: MRSA by PCR: NEGATIVE

## 2017-08-17 LAB — PROCALCITONIN

## 2017-08-17 LAB — BRAIN NATRIURETIC PEPTIDE: B NATRIURETIC PEPTIDE 5: 206 pg/mL — AB (ref 0.0–100.0)

## 2017-08-17 LAB — TROPONIN I: TROPONIN I: 0.03 ng/mL — AB (ref <0.03)

## 2017-08-17 MED ORDER — MOMETASONE FURO-FORMOTEROL FUM 200-5 MCG/ACT IN AERO
2.0000 | INHALATION_SPRAY | Freq: Two times a day (BID) | RESPIRATORY_TRACT | Status: DC
  Administered 2017-08-18 – 2017-08-23 (×11): 2 via RESPIRATORY_TRACT
  Filled 2017-08-17 (×2): qty 8.8

## 2017-08-17 MED ORDER — SENNOSIDES-DOCUSATE SODIUM 8.6-50 MG PO TABS
1.0000 | ORAL_TABLET | Freq: Every evening | ORAL | Status: DC | PRN
Start: 2017-08-17 — End: 2017-08-23

## 2017-08-17 MED ORDER — SODIUM CHLORIDE 0.9% FLUSH
3.0000 mL | INTRAVENOUS | Status: DC | PRN
Start: 2017-08-17 — End: 2017-08-23

## 2017-08-17 MED ORDER — TAMSULOSIN HCL 0.4 MG PO CAPS
0.4000 mg | ORAL_CAPSULE | Freq: Every day | ORAL | Status: DC
  Administered 2017-08-18 – 2017-08-23 (×6): 0.4 mg via ORAL
  Filled 2017-08-17 (×6): qty 1

## 2017-08-17 MED ORDER — METHYLPREDNISOLONE SODIUM SUCC 125 MG IJ SOLR
60.0000 mg | Freq: Four times a day (QID) | INTRAMUSCULAR | Status: DC
  Administered 2017-08-17 – 2017-08-22 (×19): 60 mg via INTRAVENOUS
  Filled 2017-08-17 (×19): qty 2

## 2017-08-17 MED ORDER — ORAL CARE MOUTH RINSE
15.0000 mL | Freq: Two times a day (BID) | OROMUCOSAL | Status: DC
  Administered 2017-08-19 – 2017-08-22 (×4): 15 mL via OROMUCOSAL

## 2017-08-17 MED ORDER — SODIUM CHLORIDE 0.9 % IV SOLN
1.0000 g | INTRAVENOUS | Status: DC
  Administered 2017-08-17 – 2017-08-21 (×5): 1 g via INTRAVENOUS
  Filled 2017-08-17 (×3): qty 1
  Filled 2017-08-17: qty 10
  Filled 2017-08-17: qty 1
  Filled 2017-08-17: qty 10

## 2017-08-17 MED ORDER — ONDANSETRON HCL 4 MG PO TABS: 4.0000 mg | ORAL_TABLET | Freq: Four times a day (QID) | ORAL | Status: DC | PRN

## 2017-08-17 MED ORDER — VANCOMYCIN HCL IN DEXTROSE 1-5 GM/200ML-% IV SOLN
1000.0000 mg | Freq: Once | INTRAVENOUS | Status: AC
  Administered 2017-08-17: 1000 mg via INTRAVENOUS
  Filled 2017-08-17: qty 200

## 2017-08-17 MED ORDER — ZOLPIDEM TARTRATE 5 MG PO TABS
5.0000 mg | ORAL_TABLET | Freq: Every evening | ORAL | Status: DC | PRN
  Administered 2017-08-22: 5 mg via ORAL
  Filled 2017-08-17: qty 1

## 2017-08-17 MED ORDER — SODIUM CHLORIDE 0.9 % IV SOLN
500.0000 mg | INTRAVENOUS | Status: DC
  Administered 2017-08-17 – 2017-08-21 (×5): 500 mg via INTRAVENOUS
  Filled 2017-08-17 (×6): qty 500

## 2017-08-17 MED ORDER — PRAVASTATIN SODIUM 10 MG PO TABS
20.0000 mg | ORAL_TABLET | Freq: Every day | ORAL | Status: DC
  Administered 2017-08-17 – 2017-08-22 (×6): 20 mg via ORAL
  Filled 2017-08-17 (×6): qty 2

## 2017-08-17 MED ORDER — ALBUTEROL SULFATE (2.5 MG/3ML) 0.083% IN NEBU
2.5000 mg | INHALATION_SOLUTION | RESPIRATORY_TRACT | Status: DC | PRN
  Administered 2017-08-17: 5 mg via RESPIRATORY_TRACT
  Administered 2017-08-18 – 2017-08-22 (×11): 2.5 mg via RESPIRATORY_TRACT
  Filled 2017-08-17 (×14): qty 3

## 2017-08-17 MED ORDER — TIOTROPIUM BROMIDE MONOHYDRATE 18 MCG IN CAPS
1.0000 | ORAL_CAPSULE | Freq: Every day | RESPIRATORY_TRACT | Status: DC
  Administered 2017-08-18 – 2017-08-23 (×6): 18 ug via RESPIRATORY_TRACT
  Filled 2017-08-17 (×2): qty 5

## 2017-08-17 MED ORDER — ONDANSETRON HCL 4 MG/2ML IJ SOLN: 4.0000 mg | Freq: Four times a day (QID) | INTRAMUSCULAR | Status: DC | PRN

## 2017-08-17 MED ORDER — SODIUM CHLORIDE 0.9% FLUSH
3.0000 mL | Freq: Two times a day (BID) | INTRAVENOUS | Status: DC
  Administered 2017-08-17 – 2017-08-18 (×2): 3 mL via INTRAVENOUS
  Administered 2017-08-18: 10 mL via INTRAVENOUS
  Administered 2017-08-19: 3 mL via INTRAVENOUS
  Administered 2017-08-20: 10 mL via INTRAVENOUS
  Administered 2017-08-20 – 2017-08-21 (×3): 3 mL via INTRAVENOUS

## 2017-08-17 MED ORDER — HYDROCODONE-ACETAMINOPHEN 5-325 MG PO TABS
1.0000 | ORAL_TABLET | ORAL | Status: DC | PRN
  Administered 2017-08-18 – 2017-08-19 (×3): 1 via ORAL
  Administered 2017-08-19: 2 via ORAL
  Administered 2017-08-20 – 2017-08-21 (×2): 1 via ORAL
  Filled 2017-08-17 (×5): qty 1
  Filled 2017-08-17: qty 2

## 2017-08-17 MED ORDER — SODIUM CHLORIDE 0.9 % IV BOLUS
1000.0000 mL | Freq: Once | INTRAVENOUS | Status: AC
  Administered 2017-08-17: 1000 mL via INTRAVENOUS

## 2017-08-17 MED ORDER — SODIUM CHLORIDE 0.9 % IV BOLUS
500.0000 mL | Freq: Once | INTRAVENOUS | Status: AC
  Administered 2017-08-17: 500 mL via INTRAVENOUS

## 2017-08-17 MED ORDER — ASPIRIN EC 81 MG PO TBEC
81.0000 mg | DELAYED_RELEASE_TABLET | Freq: Every day | ORAL | Status: DC
  Administered 2017-08-18 – 2017-08-23 (×6): 81 mg via ORAL
  Filled 2017-08-17 (×6): qty 1

## 2017-08-17 MED ORDER — METHYLPREDNISOLONE SODIUM SUCC 125 MG IJ SOLR
125.0000 mg | Freq: Once | INTRAMUSCULAR | Status: AC
  Administered 2017-08-17: 125 mg via INTRAVENOUS
  Filled 2017-08-17: qty 2

## 2017-08-17 MED ORDER — SODIUM CHLORIDE 0.9% FLUSH
3.0000 mL | Freq: Two times a day (BID) | INTRAVENOUS | Status: DC
  Administered 2017-08-21 – 2017-08-22 (×3): 3 mL via INTRAVENOUS

## 2017-08-17 MED ORDER — LISINOPRIL 10 MG PO TABS
10.0000 mg | ORAL_TABLET | Freq: Every day | ORAL | Status: DC
  Administered 2017-08-18 – 2017-08-22 (×5): 10 mg via ORAL
  Filled 2017-08-17 (×6): qty 1

## 2017-08-17 MED ORDER — BISACODYL 5 MG PO TBEC
5.0000 mg | DELAYED_RELEASE_TABLET | Freq: Every day | ORAL | Status: DC | PRN
Start: 2017-08-17 — End: 2017-08-23

## 2017-08-17 MED ORDER — CHLORHEXIDINE GLUCONATE 0.12 % MT SOLN
15.0000 mL | Freq: Two times a day (BID) | OROMUCOSAL | Status: DC
  Administered 2017-08-18 – 2017-08-23 (×10): 15 mL via OROMUCOSAL
  Filled 2017-08-17 (×11): qty 15

## 2017-08-17 MED ORDER — ALBUTEROL SULFATE (2.5 MG/3ML) 0.083% IN NEBU: 5.0000 mg | INHALATION_SOLUTION | Freq: Once | RESPIRATORY_TRACT | Status: DC

## 2017-08-17 MED ORDER — VITAMIN D 1000 UNITS PO TABS
1000.0000 [IU] | ORAL_TABLET | Freq: Every day | ORAL | Status: DC
  Administered 2017-08-18 – 2017-08-23 (×6): 1000 [IU] via ORAL
  Filled 2017-08-17 (×6): qty 1

## 2017-08-17 MED ORDER — ALPRAZOLAM 0.5 MG PO TABS
1.0000 mg | ORAL_TABLET | Freq: Every evening | ORAL | Status: DC | PRN
  Administered 2017-08-17 – 2017-08-20 (×5): 1 mg via ORAL
  Filled 2017-08-17 (×2): qty 2
  Filled 2017-08-17: qty 4
  Filled 2017-08-17 (×2): qty 2

## 2017-08-17 MED ORDER — SODIUM CHLORIDE 0.9 % IV SOLN
250.0000 mL | INTRAVENOUS | Status: DC | PRN
Start: 2017-08-17 — End: 2017-08-23

## 2017-08-17 MED ORDER — ACETAMINOPHEN 650 MG RE SUPP: 650.0000 mg | Freq: Four times a day (QID) | RECTAL | Status: DC | PRN

## 2017-08-17 MED ORDER — ENOXAPARIN SODIUM 40 MG/0.4ML ~~LOC~~ SOLN
40.0000 mg | SUBCUTANEOUS | Status: DC
  Administered 2017-08-17 – 2017-08-22 (×6): 40 mg via SUBCUTANEOUS
  Filled 2017-08-17 (×7): qty 0.4

## 2017-08-17 MED ORDER — ESCITALOPRAM OXALATE 10 MG PO TABS
20.0000 mg | ORAL_TABLET | Freq: Every day | ORAL | Status: DC
  Administered 2017-08-18 – 2017-08-23 (×6): 20 mg via ORAL
  Filled 2017-08-17 (×6): qty 2

## 2017-08-17 MED ORDER — IPRATROPIUM BROMIDE 0.02 % IN SOLN
RESPIRATORY_TRACT | Status: AC
Start: 2017-08-17 — End: 2017-08-17
  Administered 2017-08-17: 0.5 mg
  Filled 2017-08-17: qty 2.5

## 2017-08-17 MED ORDER — FINASTERIDE 5 MG PO TABS
5.0000 mg | ORAL_TABLET | Freq: Every day | ORAL | Status: DC
  Administered 2017-08-17 – 2017-08-22 (×6): 5 mg via ORAL
  Filled 2017-08-17 (×8): qty 1

## 2017-08-17 MED ORDER — PANTOPRAZOLE SODIUM 40 MG PO TBEC
40.0000 mg | DELAYED_RELEASE_TABLET | Freq: Every day | ORAL | Status: DC
  Administered 2017-08-18 – 2017-08-23 (×6): 40 mg via ORAL
  Filled 2017-08-17 (×6): qty 1

## 2017-08-17 MED ORDER — PIPERACILLIN-TAZOBACTAM 3.375 G IVPB 30 MIN
3.3750 g | Freq: Once | INTRAVENOUS | Status: AC
  Administered 2017-08-17: 3.375 g via INTRAVENOUS
  Filled 2017-08-17: qty 50

## 2017-08-17 MED ORDER — ACETAMINOPHEN 325 MG PO TABS
650.0000 mg | ORAL_TABLET | Freq: Four times a day (QID) | ORAL | Status: DC | PRN
  Administered 2017-08-22: 650 mg via ORAL
  Filled 2017-08-17: qty 2

## 2017-08-17 MED ORDER — LEVALBUTEROL HCL 1.25 MG/0.5ML IN NEBU
1.2500 mg | INHALATION_SOLUTION | Freq: Once | RESPIRATORY_TRACT | Status: AC
  Administered 2017-08-17: 1.25 mg via RESPIRATORY_TRACT
  Filled 2017-08-17: qty 0.5

## 2017-08-17 NOTE — ED Notes (Signed)
CRITICAL VALUE ALERT  Critical Value:  Troponin 0.03  Date & Time Notied:  08/17/17 1750  Provider Notified: Dr. Lita Mains  Orders Received/Actions taken: EDP notified, further orders entered into Epic by EDP

## 2017-08-17 NOTE — H&P (Signed)
History and Physical    Jay Mullins QVZ:[id number] DOB: 07/18/1950 DOA: 08/17/2017  PCP: Chesley Noon, MD   Patient coming from: Home  Chief Complaint: SOB, cough  HPI: Jay Mullins is a 67 y.o. male with medical history significant for 4 L/min supplemental oxygen requirement at baseline, anxiety, and hypertension, now presenting to the emergency department for evaluation of progressive shortness of breath and productive cough.  Patient reports that his symptoms began to worsen over the past couple days, and worsened significantly today, prompting him to call EMS.  He was found by EMS to be saturating in the low 80s on his usual 4 L/min of supplemental oxygen, heart rate was 140, and he was transported to the ED on CPAP.  Patient denies any chest pain, fevers, chills, or swelling or tenderness in the lower extremities.  ED Course: Upon arrival to the ED, patient is found to be afebrile, saturating low 90s on BiPAP, tachypneic in the 30s, tachycardic in the 120s, and with blood pressure 91/62.  EKG features a sinus tachycardia with rate 120, PVC, and LAFB.  Chest x-ray is notable for hazy opacity in the left upper lobe concerning for pneumonia, superimposed on severe emphysema.  Chemistry panel features a bicarbonate of 36 and CBC is notable for leukocytosis to 17,700.  Lactic acid is reassuringly normal, troponin is slightly elevated to 0.03, and BNP is elevated to 206.  Blood cultures were collected, 1.5 L normal saline administered, and the patient was treated with 125 mg IV Solu-Medrol, vancomycin, Zosyn, and Xopenex.  He continues to require BiPAP, though he appears to have improved some, and will require admission for ongoing evaluation and management.  Review of Systems:  All other systems reviewed and apart from HPI, are negative.  Past Medical History:  Diagnosis Date  . AAA (abdominal aortic aneurysm) ([place])    infrarenal AAA  . Aortic aneurysm ([place])   . Arthritis   . Benign  prostatic hyperplasia   . COPD (chronic obstructive pulmonary disease) ([place])   . Depression   . Dyspnea    on exertion  . GERD (gastroesophageal reflux disease)   . Headache   . Hypertension   . Myocardial infarction ([place]) 1980's  . On home oxygen therapy    "2l; 24/7 when I'm home" (07/27/2016)  . Panlobular emphysema ([place])   . Pneumonia    9/18  . Prostate disorder     Past Surgical History:  Procedure Laterality Date  . ABDOMINAL AORTIC ENDOVASCULAR STENT GRAFT N/A 07/27/2016   Procedure: ABDOMINAL AORTIC ENDOVASCULAR STENT GRAFT;  Surgeon: Angelia Mould, MD;  Location: [facility];  Service: Vascular;  Laterality: N/A;  . BRAIN SURGERY  Jul 14, 2013   Mass front Left side  . CARDIAC CATHETERIZATION  1980's  . ENDOSCOPIC TRANS NASAL APPROACH WITH FUSION N/A 01/29/2017   Procedure: ENDOSCOPIC TRANS NASAL APPROACH AND TRANS ORAL APPROACH WITH EXCISION PAPALOM;  Surgeon: Melida Quitter, MD;  Location: [facility];  Service: ENT;  Laterality: N/A;  . LAPAROTOMY  1970's   ulcer ruptured     reports that he has been smoking cigarettes.  He has a 40.00 pack-year smoking history. He has quit using smokeless tobacco. His smokeless tobacco use included snuff and chew. He reports that he does not drink alcohol or use drugs.  Allergies  Allergen Reactions  . Ativan [Lorazepam] Other (See Comments)    hallucinations    Family History  Problem Relation Age of Onset  . COPD Sister  Prior to Admission medications   Medication Sig Start Date End Date Taking? Authorizing Provider  albuterol (PROVENTIL HFA;VENTOLIN HFA) 108 (90 Base) MCG/ACT inhaler Inhale 2 puffs into the lungs every 6 (six) hours as needed for wheezing or shortness of breath.    [provider]  ALPRAZolam Duanne Moron) 1 MG tablet Take 1-2 mg at bedtime as needed by mouth for anxiety (sleep). Take 1 tablet (1 mg) by mouth daily at bedtime, may also take 1/2-1 tablet (0.5 - 1 mg) during the day as needed for anxiety      [provider]  Ascorbic Acid (VITAMIN C PO) Take 1 tablet daily by mouth.    [provider]  aspirin EC 81 MG tablet Take 81 mg by mouth daily.    [provider]  budesonide-formoterol (SYMBICORT) 160-4.5 MCG/ACT inhaler Inhale 1 puff into the lungs 2 (two) times daily.     [provider]  butalbital-acetaminophen-caffeine (FIORICET, ESGIC) 50-325-40 MG tablet Take 2 tablets every 4 (four) hours as needed by mouth for headache.     [provider]  Cholecalciferol (D 1000) 1000 units capsule Take 1,000 Units by mouth daily.     [provider]  colchicine 0.6 MG tablet Take 0.6 mg by mouth every 6 (six) hours as needed (gout flares). [place]    [provider]  Cyanocobalamin (VITAMIN B-12 PO) Take 1 tablet daily by mouth.    [provider]  escitalopram (LEXAPRO) 20 MG tablet Take 20 mg by mouth daily.    [provider]  finasteride (PROSCAR) 5 MG tablet Take 5 mg by mouth at bedtime.  10/17/15   [provider]  HYDROcodone-acetaminophen (NORCO/VICODIN) 5-325 MG tablet Take 1-2 tablets by mouth every 6 (six) hours as needed for moderate pain. Patient not taking: Reported on 03/07/2017 01/30/17 01/30/18  Melida Quitter, MD  ipratropium-albuterol (DUONEB) 0.5-2.5 (3) MG/3ML SOLN Inhale 3 mLs into the lungs every 6 (six) hours as needed (shortness of breath/wheezing).  11/26/14   [provider]  lisinopril (PRINIVIL,ZESTRIL) 10 MG tablet Take 10 mg by mouth at bedtime.    [provider]  OXYGEN Inhale 3 L into the lungs continuous.    [provider]  pantoprazole (PROTONIX) 40 MG tablet Take 40 mg by mouth daily. Received from [facility]    [provider]  pravastatin (PRAVACHOL) 20 MG tablet Take 1 tablet (20 mg total) by mouth at bedtime. Received from [facility] 12/07/16   Fay Records, MD  tamsulosin (FLOMAX) 0.4 MG CAPS capsule Take 0.4 mg by mouth daily.  Received from [facility]     [provider]  Tiotropium Bromide Monohydrate (SPIRIVA RESPIMAT) 2.5 MCG/ACT AERS Inhale 1 puff into the lungs daily.    [provider]    Physical Exam: Vitals:   08/17/17 1830 08/17/17 1900 08/17/17 1915 08/17/17 1916  BP: 107/72 106/76    Pulse: (!) 103 (!) 102    Resp:  (!) 26    Temp:      TempSrc:      SpO2: 100% 99% 98%   Weight:    57.6 kg (127 lb)      Constitutional: Tachypneic, dyspneic with speech, no pallor, no diaphoresis  Eyes: PERTLA, lids and conjunctivae normal ENMT: Mucous membranes are moist. Posterior pharynx clear of any exudate or lesions.   Neck: normal, supple, no masses, no thyromegaly Respiratory: Breath sounds diminished bilaterally with prolonged expiratory phase. Increased WOB. No pallor or cyanosis.  Cardiovascular: Rate ~110 and regular. No extremity edema.   Abdomen: No distension, no tenderness, soft. Bowel sounds normal.  Musculoskeletal: no clubbing / cyanosis. No joint deformity upper and lower extremities.   Skin: no significant rashes, lesions, ulcers. Warm, dry, well-perfused. Neurologic: No facial asymmetry. Sensation intact. Strength 5/5 in all 4 limbs.  Psychiatric:  Alert and oriented. Calm and cooperative.     Labs on Admission: I have personally reviewed following labs and imaging studies  CBC: Recent Labs  Lab 08/17/17 1649  WBC 17.7*  NEUTROABS 15.7*  HGB 12.4*  HCT 40.2  MCV 94.4  PLT 024   Basic Metabolic Panel: Recent Labs  Lab 08/17/17 1649  NA 139  K 4.1  CL 93*  CO2 36*  GLUCOSE 137*  BUN 20  CREATININE 0.72  CALCIUM 9.3   GFR: Estimated Creatinine Clearance: 74 mL/min (by C-G formula based on SCr of 0.72 mg/dL). Liver Function Tests: Recent Labs  Lab 08/17/17 1649  AST 11*  ALT 11*  ALKPHOS 65  BILITOT 0.9  PROT 6.9  ALBUMIN 3.6   No results for input(s): LIPASE, AMYLASE in the last 168 hours. No results for input(s): AMMONIA in the last  168 hours. Coagulation Profile: No results for input(s): INR, PROTIME in the last 168 hours. Cardiac Enzymes: Recent Labs  Lab 08/17/17 1649  TROPONINI 0.03*   BNP (last 3 results) No results for input(s): PROBNP in the last 8760 hours. HbA1C: No results for input(s): HGBA1C in the last 72 hours. CBG: No results for input(s): GLUCAP in the last 168 hours. Lipid Profile: No results for input(s): CHOL, HDL, LDLCALC, TRIG, CHOLHDL, LDLDIRECT in the last 72 hours. Thyroid Function Tests: No results for input(s): TSH, T4TOTAL, FREET4, T3FREE, THYROIDAB in the last 72 hours. Anemia Panel: No results for input(s): VITAMINB12, FOLATE, FERRITIN, TIBC, IRON, RETICCTPCT in the last 72 hours. Urine analysis:    Component Value Date/Time   COLORURINE AMBER (A) 11/19/2016 0230   APPEARANCEUR HAZY (A) 11/19/2016 0230   LABSPEC 1.032 (H) 11/19/2016 0230   PHURINE 5.0 11/19/2016 0230   GLUCOSEU NEGATIVE 11/19/2016 0230   HGBUR LARGE (A) 11/19/2016 0230   BILIRUBINUR NEGATIVE 11/19/2016 0230   KETONESUR NEGATIVE 11/19/2016 0230   PROTEINUR 100 (A) 11/19/2016 0230   UROBILINOGEN 1.0 12/13/2014 1248   NITRITE NEGATIVE 11/19/2016 0230   LEUKOCYTESUR NEGATIVE 11/19/2016 0230   Sepsis Labs: @LABRCNTIP (procalcitonin:4,lacticidven:4) )No results found for this or any previous visit (from the past 240 hour(s)).   Radiological Exams on Admission: Dg Chest Port 1 View  Result Date: 08/17/2017 CLINICAL DATA:  Worsening shortness of breath throughout the day. Decreased oxygen saturation. EXAM: PORTABLE CHEST 1 VIEW COMPARISON:  PET CT scan 12/26/2016. PA and lateral chest 01/24/2017. FINDINGS: The lungs are severely emphysematous. Opacity is seen in the left upper lung zone. Large bullous lesion in the right lower lung zone is noted. Heart size is normal. No pneumothorax or pleural effusion. IMPRESSION: Hazy opacity left upper lung zone worrisome for pneumonia in a patient with severe emphysema.  Electronically Signed   By: Inge Rise M.D.   On: 08/17/2017 17:26    EKG: Independently reviewed. Sinus tachycardia (rate 120), PVC, LAFB.   Assessment/Plan   1. Pneumonia  - Presents with progressive SOB and productive cough  - Found to be hypoxic on his usual 4 Lpm and CXR reveals LUL PNA  - Blood cultures collected in ED and abx started  - Check sputum culture, strep pneumo antigen, trend procalcitonin,  and continue empiric abx    2. 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 Hypertension severe COPD with - Continue lisinopril as tolerated   4. Anxiety  - Continue Lexapro and prn Xanax     DVT prophylaxis: Lovenox Code Status: Full  Family Communication: Discussed with patient Consults called: None Admission status: Inpatient    Vianne Bulls, MD [facility] Pager [phone]  If 7PM-7AM, please contact night-coverage [email or web address] Password [facility]  08/17/2017, 7:19 PM

## 2017-08-17 NOTE — ED Provider Notes (Signed)
[facility] EMERGENCY DEPARTMENT Provider Note   CSN: [id number] Arrival date & time: 08/17/17  1623     History   Chief Complaint Chief Complaint  Patient presents with  . Shortness of Breath    HPI Jay Mullins is a 67 y.o. male.  HPI Patient with history of severe COPD on 4 L oxygen via nasal cannula continuously.  States he has had worsening shortness of breath and coughing over the past day.  Denied chest pain or abdominal pain.  EMS was called and patient was found to have oxygen saturation in the mid 80s on his home O2.  Was given nitroglycerin by EMS and placed on CPAP. Past Medical History:  Diagnosis Date  . AAA (abdominal aortic aneurysm) ([place])    infrarenal AAA  . Aortic aneurysm ([place])   . Arthritis   . Benign prostatic hyperplasia   . COPD (chronic obstructive pulmonary disease) ([place])   . Depression   . Dyspnea    on exertion  . GERD (gastroesophageal reflux disease)   . Headache   . Hypertension   . Myocardial infarction ([place]) 1980's  . On home oxygen therapy    "2l; 24/7 when I'm home" (07/27/2016)  . Panlobular emphysema ([place])   . Pneumonia    9/18  . Prostate disorder     Patient Active Problem List   Diagnosis Date Noted  . Pneumonia 08/17/2017  . Anxiety   . 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 Acute on chronic respiratory failure with hypoxia and hypercapnia ([place]) 11/18/2016  . History of abdominal aortic aneurysm (AAA) 07/27/2016  . Current smoker 03/10/2015  . COPD with acute exacerbation ([place]) 03/10/2015  . Meningioma ([place]) 07/14/2013  . Essential (primary) hypertension 07/09/2013  . Atherosclerotic heart disease of native coronary artery without angina pectoris 07/09/2013    Past Surgical History:  Procedure Laterality Date  . ABDOMINAL AORTIC ENDOVASCULAR STENT GRAFT N/A 07/27/2016   Procedure: ABDOMINAL AORTIC ENDOVASCULAR STENT GRAFT;  Surgeon: Angelia Mould, MD;  Location: [place];  Service: Vascular;  Laterality: N/A;    . BRAIN SURGERY  Jul 14, 2013   Mass front Left side  . CARDIAC CATHETERIZATION  1980's  . ENDOSCOPIC TRANS NASAL APPROACH WITH FUSION N/A 01/29/2017   Procedure: ENDOSCOPIC TRANS NASAL APPROACH AND TRANS ORAL APPROACH WITH EXCISION PAPALOM;  Surgeon: Melida Quitter, MD;  Location: [place];  Service: ENT;  Laterality: N/A;  . LAPAROTOMY  1970's   ulcer ruptured        Home Medications    Prior to Admission medications   Medication Sig Start Date End Date Taking? Authorizing Provider  albuterol (PROVENTIL HFA;VENTOLIN HFA) 108 (90 Base) MCG/ACT inhaler Inhale 2 puffs into the lungs every 6 (six) hours as needed for wheezing or shortness of breath.    [provider]  ALPRAZolam Duanne Moron) 1 MG tablet Take 1-2 mg at bedtime as needed by mouth for anxiety (sleep). Take 1 tablet (1 mg) by mouth daily at bedtime, may also take 1/2-1 tablet (0.5 - 1 mg) during the day as needed for anxiety     [provider]  Ascorbic Acid (VITAMIN C PO) Take 1 tablet daily by mouth.    [provider]  aspirin EC 81 MG tablet Take 81 mg by mouth daily.    [provider]  budesonide-formoterol (SYMBICORT) 160-4.5 MCG/ACT inhaler Inhale 1 puff into the lungs 2 (two) times daily.     [provider]  butalbital-acetaminophen-caffeine (FIORICET, ESGIC) [phone] MG  tablet Take 2 tablets every 4 (four) hours as needed by mouth for headache.     [provider]  Cholecalciferol (D 1000) 1000 units capsule Take 1,000 Units by mouth daily.     [provider]  colchicine 0.6 MG tablet Take 0.6 mg by mouth every 6 (six) hours as needed (gout flares). [place]    [provider]  Cyanocobalamin (VITAMIN B-12 PO) Take 1 tablet daily by mouth.    [provider]  escitalopram (LEXAPRO) 20 MG tablet Take 20 mg by mouth daily.    [provider]  finasteride (PROSCAR) 5 MG tablet Take 5 mg by mouth at bedtime.  10/17/15   [provider]  HYDROcodone-acetaminophen (NORCO/VICODIN) 5-325 MG tablet Take 1-2 tablets by mouth every 6 (six) hours as needed for moderate pain. Patient not taking: Reported on 03/07/2017 01/30/17 01/30/18  Melida Quitter, MD  ipratropium-albuterol (DUONEB) 0.5-2.5 (3) MG/3ML SOLN Inhale 3 mLs into the lungs every 6 (six) hours as needed (shortness of breath/wheezing).  11/26/14   [provider]  lisinopril (PRINIVIL,ZESTRIL) 10 MG tablet Take 10 mg by mouth at bedtime.    [provider]  OXYGEN Inhale 3 L into the lungs continuous.    [provider]  pantoprazole (PROTONIX) 40 MG tablet Take 40 mg by mouth daily. Received from [facility]    [provider]  pravastatin (PRAVACHOL) 20 MG tablet Take 1 tablet (20 mg total) by mouth at bedtime. Received from [facility] 12/07/16   Fay Records, MD  tamsulosin (FLOMAX) 0.4 MG CAPS capsule Take 0.4 mg by mouth daily. Received from [facility]     [provider]  Tiotropium Bromide Monohydrate (SPIRIVA RESPIMAT) 2.5 MCG/ACT AERS Inhale 1 puff into the lungs daily.    [provider]    Family History Family History  Problem Relation Age of Onset  . COPD Sister     Social History Social History   Tobacco Use  . Smoking status: Current Every Day Smoker    Packs/day: 1.00    Years: 40.00    Pack years: 40.00    Types: Cigarettes  . Smokeless tobacco: Former Systems developer    Types: Snuff, Chew  . Tobacco comment: states stopped smoking 4 days ago  Substance Use Topics  . Alcohol use: No    Alcohol/week: 0.0 oz  . Drug use: No     Allergies   Ativan [lorazepam]   Review of Systems Review of Systems  Constitutional: Negative for chills and fever.  HENT: Negative for trouble swallowing.   Respiratory: Positive for cough and shortness of breath.   Cardiovascular: Negative for chest pain, palpitations and leg swelling.  Gastrointestinal: Negative for abdominal pain,  constipation, diarrhea, nausea and vomiting.  Genitourinary: Negative for flank pain and frequency.  Musculoskeletal: Negative for back pain, myalgias and neck pain.  Skin: Negative for rash and wound.  Neurological: Negative for dizziness, weakness, light-headedness, numbness and headaches.  All other systems reviewed and are negative.    Physical Exam Updated Vital Signs BP 112/76   Pulse 95   Temp 97.7 F (36.5 C) (Oral)   Resp 18   Wt 57.6 kg (127 lb)   SpO2 97%   BMI 18.75 kg/m   Physical Exam  Constitutional: He is oriented to person, place, and time. He appears well-developed and well-nourished.  Chronically ill-appearing.  Does not appear to be in respiratory distress.  HENT:  Head: Normocephalic and atraumatic.  Mouth/Throat:  Oropharynx is clear and moist. No oropharyngeal exudate.  Eyes: Pupils are equal, round, and reactive to light. EOM are normal.  Neck: Normal range of motion. Neck supple.  Cardiovascular: Regular rhythm.  Tachycardia.  Pulmonary/Chest: No respiratory distress. He has no wheezes. He has no rales.  Mild increased respiratory effort.  Decreased air movement throughout.  Abdominal: Soft. Bowel sounds are normal. He exhibits no distension and no mass. There is no tenderness. There is no rebound and no guarding. No hernia.  Musculoskeletal: Normal range of motion. He exhibits no edema or tenderness.  No lower extremity swelling, asymmetry or tenderness.  Neurological: He is alert and oriented to person, place, and time.  Speaking in full sentences.  Moving all extremities without focal deficit.  Sensation intact.  Skin: Skin is warm and dry. Capillary refill takes less than 2 seconds. No rash noted. No erythema.  Psychiatric: He has a normal mood and affect. His behavior is normal.  Nursing note and vitals reviewed.    ED Treatments / Results  Labs (all labs ordered are listed, but only abnormal results are displayed) Labs Reviewed  BRAIN  NATRIURETIC PEPTIDE - Abnormal; Notable for the following components:      Result Value   B Natriuretic Peptide 206.0 (*)    All other components within normal limits  CBC WITH DIFFERENTIAL/PLATELET - Abnormal; Notable for the following components:   WBC 17.7 (*)    Hemoglobin 12.4 (*)    Neutro Abs 15.7 (*)    Lymphs Abs 0.6 (*)    Monocytes Absolute 1.4 (*)    All other components within normal limits  COMPREHENSIVE METABOLIC PANEL - Abnormal; Notable for the following components:   Chloride 93 (*)    CO2 36 (*)    Glucose, Bld 137 (*)    AST 11 (*)    ALT 11 (*)    All other components within normal limits  TROPONIN I - Abnormal; Notable for the following components:   Troponin I 0.03 (*)    All other components within normal limits  BLOOD GAS, ARTERIAL - Abnormal; Notable for the following components:   pH, Arterial 7.325 (*)    pCO2 arterial 62.1 (*)    Bicarbonate 28.5 (*)    Acid-Base Excess 5.7 (*)    All other components within normal limits  CULTURE, BLOOD (ROUTINE X 2)  CULTURE, BLOOD (ROUTINE X 2)  CULTURE, EXPECTORATED SPUTUM-ASSESSMENT  GRAM STAIN  STREP PNEUMONIAE URINARY ANTIGEN  BASIC METABOLIC PANEL  MAGNESIUM  CBC  TROPONIN I  TROPONIN I  PROCALCITONIN  PROCALCITONIN  I-STAT CG4 LACTIC ACID, ED    EKG EKG Interpretation  Date/Time:  Friday August 17 2017 16:24:52 EDT Ventricular Rate:  120 PR Interval:    QRS Duration: 87 QT Interval:  310 QTC Calculation: 438 R Axis:   -75 Text Interpretation:  Sinus tachycardia Ventricular premature complex Consider right atrial enlargement Left anterior fascicular block Anteroseptal infarct, age indeterminate Confirmed by Julianne Rice [phone]) on 08/17/2017 4:47:14 PM   Radiology Dg Chest Port 1 View  Result Date: 08/17/2017 CLINICAL DATA:  Worsening shortness of breath throughout the day. Decreased oxygen saturation. EXAM: PORTABLE CHEST 1 VIEW COMPARISON:  PET CT scan 12/26/2016. PA and lateral chest  01/24/2017. FINDINGS: The lungs are severely emphysematous. Opacity is seen in the left upper lung zone. Large bullous lesion in the right lower lung zone is noted. Heart size is normal. No pneumothorax or pleural effusion. IMPRESSION: Hazy opacity left upper lung zone  worrisome for pneumonia in a patient with severe emphysema. Electronically Signed   By: Inge Rise M.D.   On: 08/17/2017 17:26    Procedures Procedures (including critical care time)  Medications Ordered in ED Medications  ALPRAZolam (XANAX) tablet 1-2 mg (has no administration in time range)  aspirin EC tablet 81 mg (has no administration in time range)  mometasone-formoterol (DULERA) 200-5 MCG/ACT inhaler 2 puff (has no administration in time range)  Cholecalciferol 1,000 Units (has no administration in time range)  escitalopram (LEXAPRO) tablet 20 mg (has no administration in time range)  finasteride (PROSCAR) tablet 5 mg (has no administration in time range)  lisinopril (PRINIVIL,ZESTRIL) tablet 10 mg (has no administration in time range)  pantoprazole (PROTONIX) EC tablet 40 mg (has no administration in time range)  pravastatin (PRAVACHOL) tablet 20 mg (has no administration in time range)  tamsulosin (FLOMAX) capsule 0.4 mg (has no administration in time range)  Tiotropium Bromide Monohydrate AERS 1 puff (has no administration in time range)  albuterol (PROVENTIL) (2.5 MG/3ML) 0.083% nebulizer solution 2.5 mg (has no administration in time range)  methylPREDNISolone sodium succinate (SOLU-MEDROL) 125 mg/2 mL injection 60 mg (has no administration in time range)  enoxaparin (LOVENOX) injection 40 mg (has no administration in time range)  sodium chloride flush (NS) 0.9 % injection 3 mL (has no administration in time range)  sodium chloride flush (NS) 0.9 % injection 3 mL (has no administration in time range)  0.9 %  sodium chloride infusion (has no administration in time range)  acetaminophen (TYLENOL) tablet 650 mg  (has no administration in time range)    Or  acetaminophen (TYLENOL) suppository 650 mg (has no administration in time range)  HYDROcodone-acetaminophen (NORCO/VICODIN) 5-325 MG per tablet 1-2 tablet (has no administration in time range)  ondansetron (ZOFRAN) tablet 4 mg (has no administration in time range)    Or  ondansetron (ZOFRAN) injection 4 mg (has no administration in time range)  senna-docusate (Senokot-S) tablet 1 tablet (has no administration in time range)  bisacodyl (DULCOLAX) EC tablet 5 mg (has no administration in time range)  cefTRIAXone (ROCEPHIN) 1 g in sodium chloride 0.9 % 100 mL IVPB (1 g Intravenous New Bag/Given 08/17/17 1950)  azithromycin (ZITHROMAX) 500 mg in sodium chloride 0.9 % 250 mL IVPB (has no administration in time range)  sodium chloride flush (NS) 0.9 % injection 3 mL (has no administration in time range)  zolpidem (AMBIEN) tablet 5 mg (has no administration in time range)  sodium chloride 0.9 % bolus 500 mL (0 mLs Intravenous Stopped 08/17/17 1720)  vancomycin (VANCOCIN) IVPB 1000 mg/200 mL premix (0 mg Intravenous Stopped 08/17/17 1936)  piperacillin-tazobactam (ZOSYN) IVPB 3.375 g (0 g Intravenous Stopped 08/17/17 1816)  sodium chloride 0.9 % bolus 1,000 mL (0 mLs Intravenous Stopped 08/17/17 1817)  methylPREDNISolone sodium succinate (SOLU-MEDROL) 125 mg/2 mL injection 125 mg (125 mg Intravenous Given 08/17/17 1807)  levalbuterol (XOPENEX) nebulizer solution 1.25 mg (1.25 mg Nebulization Given 08/17/17 1829)     Initial Impression / Assessment and Plan / ED Course  I have reviewed the triage vital signs and the nursing notes.  Pertinent labs & imaging results that were available during my care of the patient were reviewed by me and considered in my medical decision making (see chart for details).    Heart rate and blood pressure improved with IV fluids.  Evidence of left sided pneumonia on x-ray.  Given broad-spectrum antibiotics to cover for possible  sepsis.  Patient was also given  nitroglycerin prior to coming to the emergency department.  This may have something to do with his tachycardia and hypotension on presentation.  Has normal lactic acid.  Discussed with hospitalist will see patient in the emergency department and admit.   Final Clinical Impressions(s) / ED Diagnoses   Final diagnoses:  Community acquired pneumonia of left lower lobe of lung ([place])  Hypotension, unspecified hypotension type    ED Discharge Orders    None       Julianne Rice, MD 08/17/17 [phone]

## 2017-08-17 NOTE — ED Notes (Signed)
ED Provider at bedside. 

## 2017-08-17 NOTE — ED Triage Notes (Signed)
Pt brought in by EMS. Pt reports that he has had difficulty breathing today that has gotten worse throughout day. O2 sat 84 on 4 L via [place] per ems arrival C pap initiated. sats increase to 95%. Given nitro x1 per EMS protocol. HR 140s

## 2017-08-18 DIAGNOSIS — E43 Unspecified severe protein-calorie malnutrition: Secondary | ICD-10-CM | POA: Diagnosis present

## 2017-08-18 DIAGNOSIS — I10 Essential (primary) hypertension: Secondary | ICD-10-CM

## 2017-08-18 LAB — CBC
HEMATOCRIT: 38.1 % — AB (ref 39.0–52.0)
Hemoglobin: 11.8 g/dL — ABNORMAL LOW (ref 13.0–17.0)
MCH: 29.4 pg (ref 26.0–34.0)
MCHC: 31 g/dL (ref 30.0–36.0)
MCV: 94.8 fL (ref 78.0–100.0)
Platelets: 258 10*3/uL (ref 150–400)
RBC: 4.02 MIL/uL — ABNORMAL LOW (ref 4.22–5.81)
RDW: 15.1 % (ref 11.5–15.5)
WBC: 15.8 10*3/uL — ABNORMAL HIGH (ref 4.0–10.5)

## 2017-08-18 LAB — STREP PNEUMONIAE URINARY ANTIGEN: STREP PNEUMO URINARY ANTIGEN: NEGATIVE

## 2017-08-18 LAB — TROPONIN I
Troponin I: <0.03 ng/mL (ref <0.03)
Troponin I: <0.03 ng/mL (ref <0.03)
Troponin I: <0.03 ng/mL (ref <0.03)

## 2017-08-18 MED ORDER — ENSURE ENLIVE PO LIQD
237.0000 mL | Freq: Three times a day (TID) | ORAL | Status: DC
  Administered 2017-08-18 – 2017-08-22 (×3): 237 mL via ORAL

## 2017-08-18 NOTE — Progress Notes (Signed)
PROGRESS NOTE    Jay Mullins  WEX:[id number] DOB: 1950/06/28 DOA: 08/17/2017 PCP: Chesley Noon, MD     Brief Narrative:  67 year old man admitted from home on 6/14 due to shortness of breath.  He has a history of COPD and is on baseline 4 L of oxygen, also history of anxiety and hypertension.  In addition to the shortness of breath has had subjective fevers and a productive cough.  Upon EMS arrival was noted to be saturating 80% on 4 L of oxygen.  Did require BiPAP due to increased work of breathing and hypoxemia.   Assessment & Plan:   Principal Problem:   Pneumonia Active Problems:   Acute on chronic respiratory failure with hypoxia and hypercapnia (HCC)   Essential (primary) hypertension   COPD with acute exacerbation (HCC)   Severe protein-calorie malnutrition (HCC)   Acute on chronic hypoxemic and hypercapnic respiratory failure -Due to COPD with acute exacerbation in addition to community-acquired pneumonia. -Did require noninvasive positive pressure ventilation upon admission due to increased work of breathing and hypoxemia. -We will try and wean BiPAP today, respiratory is aware. -Continue Rocephin and azithromycin for community-acquired pneumonia, culture data is currently pending. -Leukocytosis has improved from 17.7-15.8 today. -Request strep pneumo and Legionella urine antigens. -For COPD, will continue IV steroids and nebs.    Benign essential hypertension -Fair management, continue lisinopril.  Generalized anxiety disorder -Continue Lexapro and as needed Xanax.  Severe protein caloric malnutrition -Request dietitian evaluation. -Ensure 3 times daily.   DVT prophylaxis: Lovenox Code Status: Full code Family Communication: Patient only Disposition Plan: Keep in ICU today in case needs to go back on BiPAP, discharge home with clinical improvement over the next 48 to 72 hours.  Consultants:   None  Procedures:   None  Antimicrobials:    Anti-infectives (From admission, onward)   Start     Dose/Rate Route Frequency Ordered Stop   08/17/17 2100  azithromycin (ZITHROMAX) 500 mg in sodium chloride 0.9 % 250 mL IVPB     500 mg 250 mL/hr over 60 Minutes Intravenous Every 24 hours 08/17/17 1919 08/24/17 2059   08/17/17 2000  cefTRIAXone (ROCEPHIN) 1 g in sodium chloride 0.9 % 100 mL IVPB     1 g 200 mL/hr over 30 Minutes Intravenous Every 24 hours 08/17/17 1919 08/24/17 1959   08/17/17 1745  vancomycin (VANCOCIN) IVPB 1000 mg/200 mL premix     1,000 mg 200 mL/hr over 60 Minutes Intravenous  Once 08/17/17 1732 08/17/17 1936   08/17/17 1745  piperacillin-tazobactam (ZOSYN) IVPB 3.375 g     3.375 g 100 mL/hr over 30 Minutes Intravenous  Once 08/17/17 1732 08/17/17 1816       Subjective: When I saw him this morning he was still wearing his BiPAP, states he felt much better but had a high degree of anxiety regarding his pneumonia and asked many questions about this.  Objective: Vitals:   08/18/17 1300 08/18/17 1320 08/18/17 1400 08/18/17 1500  BP: (!) 156/102  (!) 162/94 133/80  Pulse: 97  95 95  Resp: (!) 25  20 (!) 26  Temp:      TempSrc:      SpO2: 94% 92% (!) 89% 93%  Weight:      Height:        Intake/Output Summary (Last 24 hours) at 08/18/2017 1539 Last data filed at 08/18/2017 1500 Gross per 24 hour  Intake 2863.92 ml  Output 500 ml  Net 2363.92 ml   [employer]  08/17/17 1916 08/17/17 2117 08/18/17 0400  Weight: 57.6 kg (127 lb) 55.3 kg (121 lb 14.6 oz) 56.7 kg (125 lb)    Examination:  General exam: Alert, awake, oriented x 3, cachectic Respiratory system: Respiratory effort is normal, good air movement, minimal expiratory wheezes, coarse breath sounds bilaterally. Cardiovascular system:RRR. No murmurs, rubs, gallops. Gastrointestinal system: Abdomen is nondistended, soft and nontender. No organomegaly or masses felt. Normal bowel sounds heard. Central nervous system: Alert and oriented. No focal  neurological deficits. Extremities: No C/C/E, +pedal pulses Skin: No rashes, lesions or ulcers Psychiatry: Judgement and insight appear normal. Mood & affect appropriate.     Data Reviewed: I have personally reviewed following labs and imaging studies  CBC: Recent Labs  Lab 08/17/17 1649 08/18/17 0432  WBC 17.7* 15.8*  NEUTROABS 15.7*  --   HGB 12.4* 11.8*  HCT 40.2 38.1*  MCV 94.4 94.8  PLT 269 267   Basic Metabolic Panel: Recent Labs  Lab 08/17/17 1649  NA 139  K 4.1  CL 93*  CO2 36*  GLUCOSE 137*  BUN 20  CREATININE 0.72  CALCIUM 9.3   GFR: Estimated Creatinine Clearance: 72.8 mL/min (by C-G formula based on SCr of 0.72 mg/dL). Liver Function Tests: Recent Labs  Lab 08/17/17 1649  AST 11*  ALT 11*  ALKPHOS 65  BILITOT 0.9  PROT 6.9  ALBUMIN 3.6   No results for input(s): LIPASE, AMYLASE in the last 168 hours. No results for input(s): AMMONIA in the last 168 hours. Coagulation Profile: No results for input(s): INR, PROTIME in the last 168 hours. Cardiac Enzymes: Recent Labs  Lab 08/17/17 1649 08/17/17 2233 08/18/17 0432 08/18/17 1151  TROPONINI 0.03* <0.03 <0.03 <0.03   BNP (last 3 results) No results for input(s): PROBNP in the last 8760 hours. HbA1C: No results for input(s): HGBA1C in the last 72 hours. CBG: No results for input(s): GLUCAP in the last 168 hours. Lipid Profile: No results for input(s): CHOL, HDL, LDLCALC, TRIG, CHOLHDL, LDLDIRECT in the last 72 hours. Thyroid Function Tests: No results for input(s): TSH, T4TOTAL, FREET4, T3FREE, THYROIDAB in the last 72 hours. Anemia Panel: No results for input(s): VITAMINB12, FOLATE, FERRITIN, TIBC, IRON, RETICCTPCT in the last 72 hours. Urine analysis:    Component Value Date/Time   COLORURINE AMBER (A) 11/19/2016 0230   APPEARANCEUR HAZY (A) 11/19/2016 0230   LABSPEC 1.032 (H) 11/19/2016 0230   PHURINE 5.0 11/19/2016 0230   GLUCOSEU NEGATIVE 11/19/2016 0230   HGBUR LARGE (A)  11/19/2016 0230   BILIRUBINUR NEGATIVE 11/19/2016 0230   KETONESUR NEGATIVE 11/19/2016 0230   PROTEINUR 100 (A) 11/19/2016 0230   UROBILINOGEN 1.0 12/13/2014 1248   NITRITE NEGATIVE 11/19/2016 0230   LEUKOCYTESUR NEGATIVE 11/19/2016 0230   Sepsis Labs: @LABRCNTIP (procalcitonin:4,lacticidven:4)  ) Recent Results (from the past 240 hour(s))  Culture, blood (Routine X 2) w Reflex to ID Panel     Status: None (Preliminary result)   Collection Time: 08/17/17  4:55 PM  Result Value Ref Range Status   Specimen Description RIGHT ANTECUBITAL  Final   Special Requests   Final    BOTTLES DRAWN AEROBIC AND ANAEROBIC Blood Culture adequate volume   Culture   Final    NO GROWTH < 24 HOURS Performed at [facility], [address], [place], [place] [address]    Report Status PENDING  Incomplete  Culture, blood (Routine X 2) w Reflex to ID Panel     Status: None (Preliminary result)   Collection Time: 08/17/17  4:56  PM  Result Value Ref Range Status   Specimen Description BLOOD RIGHT ARM  Final   Special Requests   Final    BOTTLES DRAWN AEROBIC AND ANAEROBIC Blood Culture adequate volume   Culture   Final    NO GROWTH < 24 HOURS Performed at [facility], [address], [place], [place] [address]    Report Status PENDING  Incomplete  MRSA PCR Screening     Status: None   Collection Time: 08/17/17 10:10 PM  Result Value Ref Range Status   MRSA by PCR NEGATIVE NEGATIVE Final    Comment:        The GeneXpert MRSA Assay (FDA approved for NASAL specimens only), is one component of a comprehensive MRSA colonization surveillance program. It is not intended to diagnose MRSA infection nor to guide or monitor treatment for MRSA infections. Performed at [facility], [address], [place], [place] [address]          Radiology Studies: Dg Chest [facility] 1 View  Result Date: 08/17/2017 CLINICAL DATA:  Worsening shortness of breath throughout the day. Decreased oxygen saturation. EXAM:  PORTABLE CHEST 1 VIEW COMPARISON:  PET CT scan 12/26/2016. PA and lateral chest 01/24/2017. FINDINGS: The lungs are severely emphysematous. Opacity is seen in the left upper lung zone. Large bullous lesion in the right lower lung zone is noted. Heart size is normal. No pneumothorax or pleural effusion. IMPRESSION: Hazy opacity left upper lung zone worrisome for pneumonia in a patient with severe emphysema. Electronically Signed   By: Inge Rise M.D.   On: 08/17/2017 17:26        Scheduled Meds: . aspirin EC  81 mg Oral Daily  . chlorhexidine  15 mL Mouth Rinse BID  . cholecalciferol  1,000 Units Oral Daily  . enoxaparin (LOVENOX) injection  40 mg Subcutaneous Q24H  . escitalopram  20 mg Oral Daily  . feeding supplement (ENSURE ENLIVE)  237 mL Oral TID BM  . finasteride  5 mg Oral QHS  . lisinopril  10 mg Oral QHS  . mouth rinse  15 mL Mouth Rinse q12n4p  . methylPREDNISolone (SOLU-MEDROL) injection  60 mg Intravenous Q6H  . mometasone-formoterol  2 puff Inhalation BID  . pantoprazole  40 mg Oral Daily  . pravastatin  20 mg Oral q1800  . sodium chloride flush  3 mL Intravenous Q12H  . sodium chloride flush  3 mL Intravenous Q12H  . tamsulosin  0.4 mg Oral QPC breakfast  . tiotropium  1 capsule Inhalation Daily   Continuous Infusions: . sodium chloride    . azithromycin Stopped (08/17/17 2344)  . cefTRIAXone (ROCEPHIN)  IV Stopped (08/17/17 2032)     LOS: 1 day    The patient is critically ill with respiratory system failure and requires high complexity decision making for assessment and support, frequent evaluation and titration of therapies, application of advanced monitoring technologies and extensive interpretation of multiple databases.  Critical care time - 55 mins.       Lelon Frohlich, MD [facility] Pager [phone]  If 7PM-7AM, please contact night-coverage [email or web address] Password [facility] 08/18/2017, 3:39 PM

## 2017-08-18 NOTE — Progress Notes (Signed)
Initial Nutrition Assessment  DOCUMENTATION CODES:  Underweight  INTERVENTION:  Ensure Enlive po TID, each supplement provides 350 kcal and 20 grams of protein  If still admitted next week, RD can follow up and provide diet education on diet to help maintain weight.   NUTRITION DIAGNOSIS:  Increased nutrient needs related to acute illness, chronic illness(End stage COPD) as evidenced by long term progressive weight loss (>150 2 years ago)  GOAL:  Patient will meet greater than or equal to 90% of their needs  MONITOR:  PO intake, Supplement acceptance, Weight trends, I & O's, Labs  REASON FOR ASSESSMENT:  Malnutrition Screening Tool    ASSESSMENT:  67 y/o male PMHx COPD (on 4L 02), Anxiety, HTN, MI. Presents to ED via EMS with progressive SOB/Productive cough, which have worsened over past few days. Work up revealed likely PNA. Admitted for management.   RD operating remotely.  Information obtained via chart. Pt chart reviewed due to +mst, with patient reporting unintentional weight loss and eating poorly because of a decreased appetite. There was no direct mention of poor intake/reduced appetite in existing chart history  Wt wise, he was admitted at ~122 lbs. He is underweight. He does appear to have lost some weight. Per Care Everywhere shows he was 126.6 lbs 07/12/17.  He has been 126-127 lbs since beginning of this year, however was 123 last October.   Long term, He has been losing weight over the past few years. Was Over 150 lbs 2 years ago. Suspect weight loss related to much heightened energy expenditure and reduced hunger sense in endstage COPD. Will order supplements while he is admitted. May benefit from education on high kcal/pro diet at some point.   Will likely meet sev malnutrition criteria if/when physical exam can be performed  Labs: WBC:15.8-steroids, BG:115-137,  Meds:IV abx, IVF, methylprednisolone, ppi, Vit D, PRN xanax  Recent Labs  Lab 08/17/17 1649  NA 139   K 4.1  CL 93*  CO2 36*  BUN 20  CREATININE 0.72  CALCIUM 9.3  GLUCOSE 137*   NUTRITION - FOCUSED PHYSICAL EXAM: Unable to assess  Diet Order:   Diet Order           Diet Heart Room service appropriate? Yes; Fluid consistency: Thin  Diet effective now         EDUCATION NEEDS:  No education needs have been identified at this time  Skin:  Skin Assessment: Reviewed RN Assessment  Last BM:  6/14  Height:  Ht Readings from Last 1 Encounters:  08/17/17 5\' 9"  (1.753 m)   Weight:  Wt Readings from Last 1 Encounters:  08/18/17 125 lb (56.7 kg)   Wt Readings from Last 10 Encounters:  08/18/17 125 lb (56.7 kg)  03/07/17 127 lb 1.6 oz (57.7 kg)  01/29/17 130 lb 8.2 oz (59.2 kg)  01/24/17 130 lb 4.7 oz (59.1 kg)  12/07/16 123 lb 3.2 oz (55.9 kg)  11/18/16 126 lb (57.2 kg)  08/30/16 126 lb (57.2 kg)  07/27/16 138 lb 7.2 oz (62.8 kg)  07/19/16 138 lb 6.4 oz (62.8 kg)  07/17/16 138 lb 1.9 oz (62.7 kg)   Ideal Body Weight:  72.73 kg  BMI:  Body mass index is 18.46 kg/m.   Dosing weight-122 lbs -55.45 kg Estimated Nutritional Needs:  Kcal:  1900-2100 kcals (34-38 kcal/kg bw) Protein:  85-100g Pro (1.5-1.8g/kg bw) Fluid:  >1.7 L (30 ml/kg bw)  Burtis Junes RD, LDN, CNSC Clinical Nutrition Available Tues-Sat via Pager: [phone] 08/18/2017  9:14 AM

## 2017-08-19 LAB — PROCALCITONIN: Procalcitonin: <0.1 ng/mL

## 2017-08-19 LAB — STREP PNEUMONIAE URINARY ANTIGEN: Strep Pneumo Urinary Antigen: NEGATIVE

## 2017-08-19 NOTE — Progress Notes (Signed)
PROGRESS NOTE    Jay Mullins  XQJ:[id number] DOB: February 07, 1951 DOA: 08/17/2017 PCP: Chesley Noon, MD     Brief Narrative:  67 year old man admitted from home on 6/14 due to shortness of breath.  He has a history of COPD and is on baseline 4 L of oxygen, also history of anxiety and hypertension.  In addition to the shortness of breath has had subjective fevers and a productive cough.  Upon EMS arrival was noted to be saturating 80% on 4 L of oxygen.  Did require BiPAP due to increased work of breathing and hypoxemia.   Assessment & Plan:   Principal Problem:   Pneumonia Active Problems:   Acute on chronic respiratory failure with hypoxia and hypercapnia (HCC)   Essential (primary) hypertension   COPD with acute exacerbation (HCC)   Severe protein-calorie malnutrition (HCC)   Acute on chronic hypoxemic and hypercapnic respiratory failure -Due to COPD with acute exacerbation in addition to community-acquired pneumonia. -Did require noninvasive positive pressure ventilation upon admission due to increased work of breathing and hypoxemia. -Has been off BiPAP for 24 hours. -Continue Rocephin and azithromycin for community-acquired pneumonia, culture data is currently pending. -Leukocytosis has improved from 17.7-15.8 as of 6/15.  Slight uptrending leukocytosis would not be unreasonable given ongoing steroid use for his COPD. -Strep pneumo urine antigen is negative, Legionella urine antigen is pending. -For COPD, will continue IV steroids and nebs.    Benign essential hypertension -Fair management, continue lisinopril.  Generalized anxiety disorder -Continue Lexapro and as needed Xanax.  Severe protein caloric malnutrition -Request dietitian evaluation. -Ensure 3 times daily.   DVT prophylaxis: Lovenox Code Status: Full code Family Communication: Patient only Disposition Plan: Keep in ICU today in case needs to go back on BiPAP, discharge home with clinical improvement over  the next 24-48 hours.  Consultants:   None  Procedures:   None  Antimicrobials:  Anti-infectives (From admission, onward)   Start     Dose/Rate Route Frequency Ordered Stop   08/17/17 2100  azithromycin (ZITHROMAX) 500 mg in sodium chloride 0.9 % 250 mL IVPB     500 mg 250 mL/hr over 60 Minutes Intravenous Every 24 hours 08/17/17 1919 08/24/17 2059   08/17/17 2000  cefTRIAXone (ROCEPHIN) 1 g in sodium chloride 0.9 % 100 mL IVPB     1 g 200 mL/hr over 30 Minutes Intravenous Every 24 hours 08/17/17 1919 08/24/17 1959   08/17/17 1745  vancomycin (VANCOCIN) IVPB 1000 mg/200 mL premix     1,000 mg 200 mL/hr over 60 Minutes Intravenous  Once 08/17/17 1732 08/17/17 1936   08/17/17 1745  piperacillin-tazobactam (ZOSYN) IVPB 3.375 g     3.375 g 100 mL/hr over 30 Minutes Intravenous  Once 08/17/17 1732 08/17/17 1816       Subjective: No off BiPAP, lying in bed, feels well, has no complaints, is very anxious surrounding the timings of his breathing treatments.  Objective: Vitals:   08/19/17 0700 08/19/17 0800 08/19/17 1150 08/19/17 1300  BP: (!) 153/73 (!) 157/89    Pulse: 69 69    Resp: 17 18    Temp:  97.7 F (36.5 C)  98 F (36.7 C)  TempSrc:  Oral  Oral  SpO2: 97% 92% 90%   Weight:      Height:        Intake/Output Summary (Last 24 hours) at 08/19/2017 1416 Last data filed at 08/19/2017 0600 Gross per 24 hour  Intake 600 ml  Output 801 ml  Net -  201 ml   Filed Weights   08/17/17 1916 08/17/17 2117 08/18/17 0400  Weight: 57.6 kg (127 lb) 55.3 kg (121 lb 14.6 oz) 56.7 kg (125 lb)    Examination:  General exam: Alert, awake, oriented x 3 Respiratory system: Clear to auscultation. Respiratory effort normal. Cardiovascular system:RRR. No murmurs, rubs, gallops. Gastrointestinal system: Abdomen is nondistended, soft and nontender. No organomegaly or masses felt. Normal bowel sounds heard. Central nervous system: Alert and oriented. No focal neurological  deficits. Extremities: No C/C/E, +pedal pulses Skin: No rashes, lesions or ulcers Psychiatry: Judgement and insight appear normal. Mood & affect appropriate.      Data Reviewed: I have personally reviewed following labs and imaging studies  CBC: Recent Labs  Lab 08/17/17 1649 08/18/17 0432  WBC 17.7* 15.8*  NEUTROABS 15.7*  --   HGB 12.4* 11.8*  HCT 40.2 38.1*  MCV 94.4 94.8  PLT 269 831   Basic Metabolic Panel: Recent Labs  Lab 08/17/17 1649  NA 139  K 4.1  CL 93*  CO2 36*  GLUCOSE 137*  BUN 20  CREATININE 0.72  CALCIUM 9.3   GFR: Estimated Creatinine Clearance: 72.8 mL/min (by C-G formula based on SCr of 0.72 mg/dL). Liver Function Tests: Recent Labs  Lab 08/17/17 1649  AST 11*  ALT 11*  ALKPHOS 65  BILITOT 0.9  PROT 6.9  ALBUMIN 3.6   No results for input(s): LIPASE, AMYLASE in the last 168 hours. No results for input(s): AMMONIA in the last 168 hours. Coagulation Profile: No results for input(s): INR, PROTIME in the last 168 hours. Cardiac Enzymes: Recent Labs  Lab 08/17/17 1649 08/17/17 2233 08/18/17 0432 08/18/17 1151  TROPONINI 0.03* <0.03 <0.03 <0.03   BNP (last 3 results) No results for input(s): PROBNP in the last 8760 hours. HbA1C: No results for input(s): HGBA1C in the last 72 hours. CBG: No results for input(s): GLUCAP in the last 168 hours. Lipid Profile: No results for input(s): CHOL, HDL, LDLCALC, TRIG, CHOLHDL, LDLDIRECT in the last 72 hours. Thyroid Function Tests: No results for input(s): TSH, T4TOTAL, FREET4, T3FREE, THYROIDAB in the last 72 hours. Anemia Panel: No results for input(s): VITAMINB12, FOLATE, FERRITIN, TIBC, IRON, RETICCTPCT in the last 72 hours. Urine analysis:    Component Value Date/Time   COLORURINE AMBER (A) 11/19/2016 0230   APPEARANCEUR HAZY (A) 11/19/2016 0230   LABSPEC 1.032 (H) 11/19/2016 0230   PHURINE 5.0 11/19/2016 0230   GLUCOSEU NEGATIVE 11/19/2016 0230   HGBUR LARGE (A) 11/19/2016 0230    BILIRUBINUR NEGATIVE 11/19/2016 0230   KETONESUR NEGATIVE 11/19/2016 0230   PROTEINUR 100 (A) 11/19/2016 0230   UROBILINOGEN 1.0 12/13/2014 1248   NITRITE NEGATIVE 11/19/2016 0230   LEUKOCYTESUR NEGATIVE 11/19/2016 0230   Sepsis Labs: @LABRCNTIP (procalcitonin:4,lacticidven:4)  ) Recent Results (from the past 240 hour(s))  Culture, blood (Routine X 2) w Reflex to ID Panel     Status: None (Preliminary result)   Collection Time: 08/17/17  4:55 PM  Result Value Ref Range Status   Specimen Description RIGHT ANTECUBITAL  Final   Special Requests   Final    BOTTLES DRAWN AEROBIC AND ANAEROBIC Blood Culture adequate volume   Culture   Final    NO GROWTH 2 DAYS Performed at [facility], [address], [place], [place] [address]    Report Status PENDING  Incomplete  Culture, blood (Routine X 2) w Reflex to ID Panel     Status: None (Preliminary result)   Collection Time: 08/17/17  4:56 PM  Result Value Ref Range Status   Specimen Description BLOOD RIGHT ARM  Final   Special Requests   Final    BOTTLES DRAWN AEROBIC AND ANAEROBIC Blood Culture adequate volume   Culture   Final    NO GROWTH 2 DAYS Performed at [facility], [address], [place], [place] [address]    Report Status PENDING  Incomplete  MRSA PCR Screening     Status: None   Collection Time: 08/17/17 10:10 PM  Result Value Ref Range Status   MRSA by PCR NEGATIVE NEGATIVE Final    Comment:        The GeneXpert MRSA Assay (FDA approved for NASAL specimens only), is one component of a comprehensive MRSA colonization surveillance program. It is not intended to diagnose MRSA infection nor to guide or monitor treatment for MRSA infections. Performed at [facility], [address] [place], [place] [address]          Radiology Studies: Dg Chest [facility] 1 View  Result Date: 08/17/2017 CLINICAL DATA:  Worsening shortness of breath throughout the day. Decreased oxygen saturation. EXAM: PORTABLE CHEST 1 VIEW  COMPARISON:  PET CT scan 12/26/2016. PA and lateral chest 01/24/2017. FINDINGS: The lungs are severely emphysematous. Opacity is seen in the left upper lung zone. Large bullous lesion in the right lower lung zone is noted. Heart size is normal. No pneumothorax or pleural effusion. IMPRESSION: Hazy opacity left upper lung zone worrisome for pneumonia in a patient with severe emphysema. Electronically Signed   By: Inge Rise M.D.   On: 08/17/2017 17:26        Scheduled Meds: . aspirin EC  81 mg Oral Daily  . chlorhexidine  15 mL Mouth Rinse BID  . cholecalciferol  1,000 Units Oral Daily  . enoxaparin (LOVENOX) injection  40 mg Subcutaneous Q24H  . escitalopram  20 mg Oral Daily  . feeding supplement (ENSURE ENLIVE)  237 mL Oral TID BM  . finasteride  5 mg Oral QHS  . lisinopril  10 mg Oral QHS  . mouth rinse  15 mL Mouth Rinse q12n4p  . methylPREDNISolone (SOLU-MEDROL) injection  60 mg Intravenous Q6H  . mometasone-formoterol  2 puff Inhalation BID  . pantoprazole  40 mg Oral Daily  . pravastatin  20 mg Oral q1800  . sodium chloride flush  3 mL Intravenous Q12H  . sodium chloride flush  3 mL Intravenous Q12H  . tamsulosin  0.4 mg Oral QPC breakfast  . tiotropium  1 capsule Inhalation Daily   Continuous Infusions: . sodium chloride    . azithromycin Stopped (08/18/17 2211)  . cefTRIAXone (ROCEPHIN)  IV Stopped (08/18/17 2015)     LOS: 2 days    Time spent: 35 minutes. Greater than 50% of this time was spent in direct contact with the patient, coordinating care and discussing relevant ongoing clinical issues, including progression of his clinical illness and respiratory failure, calming his anxiety explaining that he is indeed improving and discussing with family members.       Lelon Frohlich, MD [facility] Pager [phone]  If 7PM-7AM, please contact night-coverage [email or web address] Password [email or web address] 08/19/2017, 2:16 PM

## 2017-08-19 NOTE — Progress Notes (Signed)
Pt placed on BIPAP due to SOB & WOB. Pt received MDI and nebulizer with little relief.Pt tolerating BIPAP well

## 2017-08-19 NOTE — Progress Notes (Signed)
Pt taken off BIPAP and returned to 4L [place]

## 2017-08-20 ENCOUNTER — Inpatient Hospital Stay (HOSPITAL_COMMUNITY): Payer: Medicare Other

## 2017-08-20 LAB — CBC
HEMATOCRIT: 36.7 % — AB (ref 39.0–52.0)
HEMOGLOBIN: 11.6 g/dL — AB (ref 13.0–17.0)
MCH: 28.9 pg (ref 26.0–34.0)
MCHC: 31.6 g/dL (ref 30.0–36.0)
MCV: 91.3 fL (ref 78.0–100.0)
Platelets: 315 10*3/uL (ref 150–400)
RBC: 4.02 MIL/uL — AB (ref 4.22–5.81)
RDW: 15 % (ref 11.5–15.5)
WBC: 6.3 10*3/uL (ref 4.0–10.5)

## 2017-08-20 LAB — BLOOD GAS, ARTERIAL
ACID-BASE EXCESS: 8.7 mmol/L — AB (ref 0.0–2.0)
Bicarbonate: 31.7 mmol/L — ABNORMAL HIGH (ref 20.0–28.0)
Delivery systems: POSITIVE
Drawn by: 270161
EXPIRATORY PAP: 6
FIO2: 40
INSPIRATORY PAP: 14
O2 SAT: 91.1 %
PATIENT TEMPERATURE: 37
PH ART: 7.443 (ref 7.350–7.450)
PO2 ART: 63.8 mmHg — AB (ref 83.0–108.0)
RATE: 8 resp/min
pCO2 arterial: 49.1 mmHg — ABNORMAL HIGH (ref 32.0–48.0)

## 2017-08-20 LAB — GLUCOSE, CAPILLARY: GLUCOSE-CAPILLARY: 145 mg/dL — AB (ref 65–99)

## 2017-08-20 LAB — BASIC METABOLIC PANEL
ANION GAP: 7 (ref 5–15)
BUN: 25 mg/dL — ABNORMAL HIGH (ref 6–20)
CALCIUM: 8.8 mg/dL — AB (ref 8.9–10.3)
CO2: 35 mmol/L — ABNORMAL HIGH (ref 22–32)
Chloride: 95 mmol/L — ABNORMAL LOW (ref 101–111)
Creatinine, Ser: 0.63 mg/dL (ref 0.61–1.24)
Glucose, Bld: 175 mg/dL — ABNORMAL HIGH (ref 65–99)
POTASSIUM: 3.9 mmol/L (ref 3.5–5.1)
SODIUM: 137 mmol/L (ref 135–145)

## 2017-08-20 LAB — LEGIONELLA PNEUMOPHILA SEROGP 1 UR AG: L. PNEUMOPHILA SEROGP 1 UR AG: NEGATIVE

## 2017-08-20 MED ORDER — LORAZEPAM 2 MG/ML IJ SOLN
0.5000 mg | Freq: Once | INTRAMUSCULAR | Status: AC
  Administered 2017-08-20: 0.5 mg via INTRAVENOUS
  Filled 2017-08-20: qty 1

## 2017-08-20 MED ORDER — METHYLPREDNISOLONE SODIUM SUCC 125 MG IJ SOLR
125.0000 mg | Freq: Once | INTRAMUSCULAR | Status: AC
  Administered 2017-08-20: 125 mg via INTRAVENOUS
  Filled 2017-08-20: qty 2

## 2017-08-20 NOTE — Progress Notes (Signed)
Pt removed from BIPAP at this time and placed on 4lpm [place]. He states he cannot breathe. I instructed him to try pursed lip breathing. His SPO2 remained 93%-94%. The patient appears to be working somewhat but not enough to warrant bipap at this time. RT will cont to monitor.

## 2017-08-20 NOTE — Progress Notes (Signed)
Pt taken off BIPAP for med intake. Placed on 4L [place]

## 2017-08-20 NOTE — Progress Notes (Signed)
Pt returned to BIPAP due to WOB & SOB

## 2017-08-20 NOTE — Progress Notes (Signed)
Pt placed on BIPAP due to severe SOB and distress. BIPAP placed, PRN nebulizer given and Lysbeth Galas, RN gave pt anxiety medication. Pt states he feels much better

## 2017-08-20 NOTE — Progress Notes (Signed)
PROGRESS NOTE    Jay Mullins  UYQ:[id number] DOB: 20-Feb-1951 DOA: 08/17/2017 PCP: Chesley Noon, MD     Brief Narrative:  67 year old man admitted from home on 6/14 due to shortness of breath.  He has a history of COPD and is on baseline 4 L of oxygen, also history of anxiety and hypertension.  In addition to the shortness of breath has had subjective fevers and a productive cough.  Upon EMS arrival was noted to be saturating 80% on 4 L of oxygen.  Did require BiPAP due to increased work of breathing and hypoxemia.   Assessment & Plan:   Principal Problem:   Pneumonia Active Problems:   Acute on chronic respiratory failure with hypoxia and hypercapnia (HCC)   Essential (primary) hypertension   COPD with acute exacerbation (HCC)   Severe protein-calorie malnutrition (HCC)   Acute on chronic hypoxemic and hypercapnic respiratory failure -Due to COPD with acute exacerbation in addition to community-acquired pneumonia. -Did require noninvasive positive pressure ventilation upon admission due to increased work of breathing and hypoxemia. -In the a.m. of 6/17 developed increased respiratory distress with work of breathing, tachycardia and tachypnea but was maintaining oxygen saturations in the mid 90s.  BiPAP was placed and despite this continued to have tripoding. -ABG was requested: 7.44/49/64/31, portable chest x-ray shows improving opacity in the left mid and lower lung, bullous emphysema. -He also has a history of severe anxiety disorder, gave him a dose of IV Ativan to calm him down and hopefully avoid intubation.  Upon follow-up evaluation he is resting comfortably on BiPAP, tachypnea and tachycardia has resolved, he continues to maintain good oxygen saturations. -Continue Rocephin and azithromycin for community-acquired pneumonia, culture data remains negative currently. -Leukocytosis has improved from 17.7 to 6.3 as of 6/15.   -Strep pneumo urine antigen is negative, Legionella  urine antigen is negative. -For COPD, will continue IV steroids and nebs.    Benign essential hypertension -Fair management, continue lisinopril.  Generalized anxiety disorder -Continue Lexapro and as needed Xanax. -Given IV Ativan on 6/17 due to severe anxiety causing respiratory distress.  Severe protein caloric malnutrition -Request dietitian evaluation. -Ensure 3 times daily.   DVT prophylaxis: Lovenox Code Status: Full code Family Communication: Patient only Disposition Plan: Keep in ICU today as he has gone back on BiPAP and remains with delicate respiratory status and may decompensate quickly.   Consultants:   Pulmonary pending  Procedures:   None  Antimicrobials:  Anti-infectives (From admission, onward)   Start     Dose/Rate Route Frequency Ordered Stop   08/17/17 2100  azithromycin (ZITHROMAX) 500 mg in sodium chloride 0.9 % 250 mL IVPB     500 mg 250 mL/hr over 60 Minutes Intravenous Every 24 hours 08/17/17 1919 08/24/17 2059   08/17/17 2000  cefTRIAXone (ROCEPHIN) 1 g in sodium chloride 0.9 % 100 mL IVPB     1 g 200 mL/hr over 30 Minutes Intravenous Every 24 hours 08/17/17 1919 08/24/17 1959   08/17/17 1745  vancomycin (VANCOCIN) IVPB 1000 mg/200 mL premix     1,000 mg 200 mL/hr over 60 Minutes Intravenous  Once 08/17/17 1732 08/17/17 1936   08/17/17 1745  piperacillin-tazobactam (ZOSYN) IVPB 3.375 g     3.375 g 100 mL/hr over 30 Minutes Intravenous  Once 08/17/17 1732 08/17/17 1816       Subjective: When I first saw him this morning was sitting straight up in bed, BiPAP on, tripoding, unable to speak in full sentences.  After IV  Ativan given upon my return to few hours later he is calm, in bed no longer tachypneic or tachycardic and maintaining good oxygen saturations.  Objective: Vitals:   08/20/17 1200 08/20/17 1300 08/20/17 1400 08/20/17 1501  BP: (!) 163/96 (!) 163/112 (!) 155/103   Pulse: (!) 59 90 (!) 59 73  Resp: (!) 21 (!) 26 (!) 22 (!) 38    Temp:      TempSrc:      SpO2: 100% 100% 100% 100%  Weight:      Height:        Intake/Output Summary (Last 24 hours) at 08/20/2017 1631 Last data filed at 08/20/2017 1230 Gross per 24 hour  Intake 240 ml  Output 600 ml  Net -360 ml   Filed Weights   08/17/17 2117 08/18/17 0400 08/20/17 0400  Weight: 55.3 kg (121 lb 14.6 oz) 56.7 kg (125 lb) 55.1 kg (121 lb 7.6 oz)    Examination:  General exam: Alert, awake, oriented x 3 Respiratory system: Increased work of breathing, fair air movement, no wheezes rhonchi. Cardiovascular system: Tachycardic, regular, no murmurs, rubs or gallops Gastrointestinal system: Abdomen is nondistended, soft and nontender. No organomegaly or masses felt. Normal bowel sounds heard. Central nervous system: Alert and oriented. No focal neurological deficits. Extremities: No C/C/E, +pedal pulses Skin: No rashes, lesions or ulcers Psychiatry: Judgement and insight appear normal. Mood & affect appropriate.       Data Reviewed: I have personally reviewed following labs and imaging studies  CBC: Recent Labs  Lab 08/17/17 1649 08/18/17 0432 08/20/17 0417  WBC 17.7* 15.8* 6.3  NEUTROABS 15.7*  --   --   HGB 12.4* 11.8* 11.6*  HCT 40.2 38.1* 36.7*  MCV 94.4 94.8 91.3  PLT 269 258 283   Basic Metabolic Panel: Recent Labs  Lab 08/17/17 1649 08/20/17 0417  NA 139 137  K 4.1 3.9  CL 93* 95*  CO2 36* 35*  GLUCOSE 137* 175*  BUN 20 25*  CREATININE 0.72 0.63  CALCIUM 9.3 8.8*   GFR: Estimated Creatinine Clearance: 70.8 mL/min (by C-G formula based on SCr of 0.63 mg/dL). Liver Function Tests: Recent Labs  Lab 08/17/17 1649  AST 11*  ALT 11*  ALKPHOS 65  BILITOT 0.9  PROT 6.9  ALBUMIN 3.6   No results for input(s): LIPASE, AMYLASE in the last 168 hours. No results for input(s): AMMONIA in the last 168 hours. Coagulation Profile: No results for input(s): INR, PROTIME in the last 168 hours. Cardiac Enzymes: Recent Labs  Lab  08/17/17 1649 08/17/17 2233 08/18/17 0432 08/18/17 1151  TROPONINI 0.03* <0.03 <0.03 <0.03   BNP (last 3 results) No results for input(s): PROBNP in the last 8760 hours. HbA1C: No results for input(s): HGBA1C in the last 72 hours. CBG: Recent Labs  Lab 08/20/17 0737  GLUCAP 145*   Lipid Profile: No results for input(s): CHOL, HDL, LDLCALC, TRIG, CHOLHDL, LDLDIRECT in the last 72 hours. Thyroid Function Tests: No results for input(s): TSH, T4TOTAL, FREET4, T3FREE, THYROIDAB in the last 72 hours. Anemia Panel: No results for input(s): VITAMINB12, FOLATE, FERRITIN, TIBC, IRON, RETICCTPCT in the last 72 hours. Urine analysis:    Component Value Date/Time   COLORURINE AMBER (A) 11/19/2016 0230   APPEARANCEUR HAZY (A) 11/19/2016 0230   LABSPEC 1.032 (H) 11/19/2016 0230   PHURINE 5.0 11/19/2016 0230   GLUCOSEU NEGATIVE 11/19/2016 0230   HGBUR LARGE (A) 11/19/2016 0230   BILIRUBINUR NEGATIVE 11/19/2016 0230   KETONESUR NEGATIVE 11/19/2016 0230  PROTEINUR 100 (A) 11/19/2016 0230   UROBILINOGEN 1.0 12/13/2014 1248   NITRITE NEGATIVE 11/19/2016 0230   LEUKOCYTESUR NEGATIVE 11/19/2016 0230   Sepsis Labs: @LABRCNTIP (procalcitonin:4,lacticidven:4)  ) Recent Results (from the past 240 hour(s))  Culture, blood (Routine X 2) w Reflex to ID Panel     Status: None (Preliminary result)   Collection Time: 08/17/17  4:55 PM  Result Value Ref Range Status   Specimen Description RIGHT ANTECUBITAL  Final   Special Requests   Final    BOTTLES DRAWN AEROBIC AND ANAEROBIC Blood Culture adequate volume   Culture   Final    NO GROWTH 3 DAYS Performed at [facility], [address], [place], [place] [address]    Report Status PENDING  Incomplete  Culture, blood (Routine X 2) w Reflex to ID Panel     Status: None (Preliminary result)   Collection Time: 08/17/17  4:56 PM  Result Value Ref Range Status   Specimen Description BLOOD RIGHT ARM  Final   Special Requests   Final    BOTTLES  DRAWN AEROBIC AND ANAEROBIC Blood Culture adequate volume   Culture   Final    NO GROWTH 3 DAYS Performed at [facility], [address], [place], [place] [address]    Report Status PENDING  Incomplete  MRSA PCR Screening     Status: None   Collection Time: 08/17/17 10:10 PM  Result Value Ref Range Status   MRSA by PCR NEGATIVE NEGATIVE Final    Comment:        The GeneXpert MRSA Assay (FDA approved for NASAL specimens only), is one component of a comprehensive MRSA colonization surveillance program. It is not intended to diagnose MRSA infection nor to guide or monitor treatment for MRSA infections. Performed at [facility], [address], [place],  [address]          Radiology Studies: Dg Chest [facility] 1 View  Result Date: 08/20/2017 CLINICAL DATA:  Hypoxemia EXAM: PORTABLE CHEST 1 VIEW COMPARISON:  08/17/2017 FINDINGS: Emphysematous changes with bullous changes in the right lower lung. Continued hazy opacity over the mid and left lower lung, improved since prior study, likely improving pneumonia. Heart is normal size. No acute bony abnormality. IMPRESSION: Bullous emphysema. Improving opacity in the left mid and lower lung, likely improving pneumonia. Electronically Signed   By: Rolm Baptise M.D.   On: 08/20/2017 11:51        Scheduled Meds: . aspirin EC  81 mg Oral Daily  . chlorhexidine  15 mL Mouth Rinse BID  . cholecalciferol  1,000 Units Oral Daily  . enoxaparin (LOVENOX) injection  40 mg Subcutaneous Q24H  . escitalopram  20 mg Oral Daily  . feeding supplement (ENSURE ENLIVE)  237 mL Oral TID BM  . finasteride  5 mg Oral QHS  . lisinopril  10 mg Oral QHS  . mouth rinse  15 mL Mouth Rinse q12n4p  . methylPREDNISolone (SOLU-MEDROL) injection  60 mg Intravenous Q6H  . mometasone-formoterol  2 puff Inhalation BID  . pantoprazole  40 mg Oral Daily  . pravastatin  20 mg Oral q1800  . sodium chloride flush  3 mL Intravenous Q12H  . sodium chloride flush  3  mL Intravenous Q12H  . tamsulosin  0.4 mg Oral QPC breakfast  . tiotropium  1 capsule Inhalation Daily   Continuous Infusions: . sodium chloride    . azithromycin Stopped (08/19/17 2230)  . cefTRIAXone (ROCEPHIN)  IV Stopped (08/19/17 2020)  LOS: 3 days    The patient is critically ill with respiratory system failure and requires high complexity decision making for assessment and support, frequent evaluation and titration of therapies, application of advanced monitoring technologies and extensive interpretation of multiple databases.  Critical care time - 45 mins.         Lelon Frohlich, MD [facility] Pager [phone]  If 7PM-7AM, please contact night-coverage [email or web address] Password [facility] 08/20/2017, 4:31 PM

## 2017-08-20 NOTE — Progress Notes (Signed)
Pt placed on BIPAP for sleep. BIPAP plugged into red outlet. Protective pad placed on bridge of pt's nose

## 2017-08-20 NOTE — Progress Notes (Signed)
Full note to follow. Called by RN due to increased respiratory distress. Patient sitting straight up in bed, tachypneic. HR 130s but saturating well in the mid 90s. Here with CAP and COPD exacerbation. Fair air movement on lung auscultation. No audible wheezing. Ordered STAT ABG, PCXR, IV ativan (patient known to have high levels of anxiety). Discussed code status with patient: he wishes to remain full code. Understands that he is currently at a high risk for intubation. Further plans pending above results and clinical progression.  Domingo Mend, MD [facility] Pager: [phone]

## 2017-08-21 ENCOUNTER — Encounter (HOSPITAL_COMMUNITY): Payer: Self-pay | Admitting: Primary Care

## 2017-08-21 DIAGNOSIS — Z7189 Other specified counseling: Secondary | ICD-10-CM

## 2017-08-21 DIAGNOSIS — Z515 Encounter for palliative care: Secondary | ICD-10-CM

## 2017-08-21 LAB — CBC
HCT: 38.5 % — ABNORMAL LOW (ref 39.0–52.0)
HEMOGLOBIN: 12.1 g/dL — AB (ref 13.0–17.0)
MCH: 28.7 pg (ref 26.0–34.0)
MCHC: 31.4 g/dL (ref 30.0–36.0)
MCV: 91.2 fL (ref 78.0–100.0)
Platelets: 314 10*3/uL (ref 150–400)
RBC: 4.22 MIL/uL (ref 4.22–5.81)
RDW: 15 % (ref 11.5–15.5)
WBC: 7.3 10*3/uL (ref 4.0–10.5)

## 2017-08-21 LAB — BASIC METABOLIC PANEL
ANION GAP: 5 (ref 5–15)
BUN: 33 mg/dL — ABNORMAL HIGH (ref 6–20)
CALCIUM: 8.8 mg/dL — AB (ref 8.9–10.3)
CO2: 36 mmol/L — AB (ref 22–32)
Chloride: 97 mmol/L — ABNORMAL LOW (ref 101–111)
Creatinine, Ser: 0.78 mg/dL (ref 0.61–1.24)
GFR calc Af Amer: >60 mL/min (ref >60)
GFR calc non Af Amer: >60 mL/min (ref >60)
GLUCOSE: 152 mg/dL — AB (ref 65–99)
Potassium: 4.9 mmol/L (ref 3.5–5.1)
Sodium: 138 mmol/L (ref 135–145)

## 2017-08-21 LAB — GLUCOSE, CAPILLARY: GLUCOSE-CAPILLARY: 141 mg/dL — AB (ref 65–99)

## 2017-08-21 MED ORDER — ALPRAZOLAM 0.5 MG PO TABS
0.5000 mg | ORAL_TABLET | Freq: Three times a day (TID) | ORAL | Status: DC
  Administered 2017-08-21 – 2017-08-23 (×7): 0.5 mg via ORAL
  Filled 2017-08-21 (×8): qty 1

## 2017-08-21 NOTE — Progress Notes (Signed)
PROGRESS NOTE    Jay Mullins  GXQ:[id number] DOB: [birth year] DOA: 08/17/2017 PCP: Chesley Noon, MD     Brief Narrative:  67 year old man admitted from home on 6/14 due to shortness of breath.  He has a history of COPD and is on baseline 4 L of oxygen, also history of anxiety and hypertension.  In addition to the shortness of breath has had subjective fevers and a productive cough.  Upon EMS arrival was noted to be saturating 80% on 4 L of oxygen.  Did require BiPAP due to increased work of breathing and hypoxemia.   Assessment & Plan:   Principal Problem:   Pneumonia Active Problems:   Acute on chronic respiratory failure with hypoxia and hypercapnia (HCC)   Essential (primary) hypertension   COPD with acute exacerbation (HCC)   Severe protein-calorie malnutrition (HCC)   Acute on chronic hypoxemic and hypercapnic respiratory failure -Due to COPD with acute exacerbation in addition to community-acquired pneumonia. -Did require noninvasive positive pressure ventilation upon admission due to increased work of breathing and hypoxemia. -Has been on and off BiPAP since admission, noted at times to be markedly anxious and this causes him to have increased work of breathing.  He constantly says that he needs the BiPAP machine, however sats are in the mid 90s, ABG yesterday during 1 of his episodes looked fairly well with a pH of 7.44, PCO2 of 49, PO2 of 64 bicarb of 31. -Will schedule Xanax 0.5 mg every 8 hours to assist with anxiety. 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 -Most certainly would benefit from outpatient follow-up with pulmonology.   -Inpatient pulmonary consultation has been requested.   -Continue Rocephin and azithromycin for community-acquired pneumonia, culture data remains negative currently.  Chest x-ray done on 6/17 shows improvement of  pneumonia. -Leukocytosis has improved from 17.7 to 6.3 as of 6/15.   -Strep pneumo urine antigen is negative, Legionella urine antigen is negative. -For COPD, will continue IV steroids and nebs.    Benign essential hypertension -Fair management, continue lisinopril.  Generalized anxiety disorder -Continue Lexapro and as needed Xanax. -Placed on every 8 hours Xanax on 6/18.  Severe protein caloric malnutrition -Request dietitian evaluation. -Ensure 3 times daily.   DVT prophylaxis: Lovenox Code Status: Full code Family Communication: Patient only Disposition Plan: Keep in ICU today as he has gone back on BiPAP intermittently and remains with delicate respiratory status and may decompensate quickly.  I believe he would benefit from palliative care consultation given his end-stage COPD.  Consultants:   Pulmonary pending  Procedures:   None  Antimicrobials:  Anti-infectives (From admission, onward)   Start     Dose/Rate Route Frequency Ordered Stop   08/17/17 2100  azithromycin (ZITHROMAX) 500 mg in sodium chloride 0.9 % 250 mL IVPB     500 mg 250 mL/hr over 60 Minutes Intravenous Every 24 hours 08/17/17 1919 08/24/17 2059   08/17/17 2000  cefTRIAXone (ROCEPHIN) 1 g in sodium chloride 0.9 % 100 mL IVPB     1 g 200 mL/hr over 30 Minutes Intravenous Every 24 hours 08/17/17 1919 08/24/17 1959   08/17/17 1745  vancomycin (VANCOCIN) IVPB 1000 mg/200 mL premix     1,000 mg 200 mL/hr over 60 Minutes Intravenous  Once 08/17/17 1732 08/17/17 1936   08/17/17 1745  piperacillin-tazobactam (ZOSYN) IVPB 3.375 g     3.375 g 100 mL/hr over 30 Minutes  Intravenous  Once 08/17/17 1732 08/17/17 1816       Subjective: Lying in bed, nasal cannula oxygen, multiple family members at bedside.  Is currently calm, does admit to a certain degree of anxiety.  Objective: Vitals:   08/21/17 0731 08/21/17 0814 08/21/17 0820 08/21/17 0900  BP:    122/89  Pulse: (!) 54   82  Resp: (!) 21   (!) 36   Temp: 97.9 F (36.6 C)     TempSrc: Axillary     SpO2: 97% 96% 96% (!) 85%  Weight:      Height:        Intake/Output Summary (Last 24 hours) at 08/21/2017 1038 Last data filed at 08/21/2017 0400 Gross per 24 hour  Intake 1389.17 ml  Output 700 ml  Net 689.17 ml   Filed Weights   08/18/17 0400 08/20/17 0400 08/21/17 0500  Weight: 56.7 kg (125 lb) 55.1 kg (121 lb 7.6 oz) 55 kg (121 lb 4.1 oz)    Examination:  General exam: Alert, awake, oriented x 3, cachectic Respiratory system: Clear to auscultation. Respiratory effort normal. Cardiovascular system:RRR. No murmurs, rubs, gallops. Gastrointestinal system: Abdomen is nondistended, soft and nontender. No organomegaly or masses felt. Normal bowel sounds heard. Central nervous system: Alert and oriented. No focal neurological deficits. Extremities: No C/C/E, +pedal pulses Skin: No rashes, lesions or ulcers Psychiatry: Judgement and insight appear normal. Mood & affect appropriate.        Data Reviewed: I have personally reviewed following labs and imaging studies  CBC: Recent Labs  Lab 08/17/17 1649 08/18/17 0432 08/20/17 0417 08/21/17 0413  WBC 17.7* 15.8* 6.3 7.3  NEUTROABS 15.7*  --   --   --   HGB 12.4* 11.8* 11.6* 12.1*  HCT 40.2 38.1* 36.7* 38.5*  MCV 94.4 94.8 91.3 91.2  PLT 269 258 315 606   Basic Metabolic Panel: Recent Labs  Lab 08/17/17 1649 08/20/17 0417 08/21/17 0413  NA 139 137 138  K 4.1 3.9 4.9  CL 93* 95* 97*  CO2 36* 35* 36*  GLUCOSE 137* 175* 152*  BUN 20 25* 33*  CREATININE 0.72 0.63 0.78  CALCIUM 9.3 8.8* 8.8*   GFR: Estimated Creatinine Clearance: 70.7 mL/min (by C-G formula based on SCr of 0.78 mg/dL). Liver Function Tests: Recent Labs  Lab 08/17/17 1649  AST 11*  ALT 11*  ALKPHOS 65  BILITOT 0.9  PROT 6.9  ALBUMIN 3.6   No results for input(s): LIPASE, AMYLASE in the last 168 hours. No results for input(s): AMMONIA in the last 168 hours. Coagulation Profile: No  results for input(s): INR, PROTIME in the last 168 hours. Cardiac Enzymes: Recent Labs  Lab 08/17/17 1649 08/17/17 2233 08/18/17 0432 08/18/17 1151  TROPONINI 0.03* <0.03 <0.03 <0.03   BNP (last 3 results) No results for input(s): PROBNP in the last 8760 hours. HbA1C: No results for input(s): HGBA1C in the last 72 hours. CBG: Recent Labs  Lab 08/20/17 0737 08/21/17 0733  GLUCAP 145* 141*   Lipid Profile: No results for input(s): CHOL, HDL, LDLCALC, TRIG, CHOLHDL, LDLDIRECT in the last 72 hours. Thyroid Function Tests: No results for input(s): TSH, T4TOTAL, FREET4, T3FREE, THYROIDAB in the last 72 hours. Anemia Panel: No results for input(s): VITAMINB12, FOLATE, FERRITIN, TIBC, IRON, RETICCTPCT in the last 72 hours. Urine analysis:    Component Value Date/Time   COLORURINE AMBER (A) 11/19/2016 0230   APPEARANCEUR HAZY (A) 11/19/2016 0230   LABSPEC 1.032 (H) 11/19/2016 0230   PHURINE 5.0  11/19/2016 0230   GLUCOSEU NEGATIVE 11/19/2016 0230   HGBUR LARGE (A) 11/19/2016 0230   BILIRUBINUR NEGATIVE 11/19/2016 0230   KETONESUR NEGATIVE 11/19/2016 0230   PROTEINUR 100 (A) 11/19/2016 0230   UROBILINOGEN 1.0 12/13/2014 1248   NITRITE NEGATIVE 11/19/2016 0230   LEUKOCYTESUR NEGATIVE 11/19/2016 0230   Sepsis Labs: @LABRCNTIP (procalcitonin:4,lacticidven:4)  ) Recent Results (from the past 240 hour(s))  Culture, blood (Routine X 2) w Reflex to ID Panel     Status: None (Preliminary result)   Collection Time: 08/17/17  4:55 PM  Result Value Ref Range Status   Specimen Description RIGHT ANTECUBITAL  Final   Special Requests   Final    BOTTLES DRAWN AEROBIC AND ANAEROBIC Blood Culture adequate volume   Culture   Final    NO GROWTH 4 DAYS Performed at [facility], [address], [place], [place] [address]    Report Status PENDING  Incomplete  Culture, blood (Routine X 2) w Reflex to ID Panel     Status: None (Preliminary result)   Collection Time: 08/17/17  4:56 PM  Result  Value Ref Range Status   Specimen Description BLOOD RIGHT ARM  Final   Special Requests   Final    BOTTLES DRAWN AEROBIC AND ANAEROBIC Blood Culture adequate volume   Culture   Final    NO GROWTH 4 DAYS Performed at [facility], [address], [place], [place] [address]    Report Status PENDING  Incomplete  MRSA PCR Screening     Status: None   Collection Time: 08/17/17 10:10 PM  Result Value Ref Range Status   MRSA by PCR NEGATIVE NEGATIVE Final    Comment:        The GeneXpert MRSA Assay (FDA approved for NASAL specimens only), is one component of a comprehensive MRSA colonization surveillance program. It is not intended to diagnose MRSA infection nor to guide or monitor treatment for MRSA infections. Performed at [facility], [address] [place], [place] [address]          Radiology Studies: Dg Chest [facility] 1 View  Result Date: 08/20/2017 CLINICAL DATA:  Hypoxemia EXAM: PORTABLE CHEST 1 VIEW COMPARISON:  08/17/2017 FINDINGS: Emphysematous changes with bullous changes in the right lower lung. Continued hazy opacity over the mid and left lower lung, improved since prior study, likely improving pneumonia. Heart is normal size. No acute bony abnormality. IMPRESSION: Bullous emphysema. Improving opacity in the left mid and lower lung, likely improving pneumonia. Electronically Signed   By: Rolm Baptise M.D.   On: 08/20/2017 11:51        Scheduled Meds: . aspirin EC  81 mg Oral Daily  . chlorhexidine  15 mL Mouth Rinse BID  . cholecalciferol  1,000 Units Oral Daily  . enoxaparin (LOVENOX) injection  40 mg Subcutaneous Q24H  . escitalopram  20 mg Oral Daily  . feeding supplement (ENSURE ENLIVE)  237 mL Oral TID BM  . finasteride  5 mg Oral QHS  . lisinopril  10 mg Oral QHS  . mouth rinse  15 mL Mouth Rinse q12n4p  . methylPREDNISolone (SOLU-MEDROL) injection  60 mg Intravenous Q6H  . mometasone-formoterol  2 puff Inhalation BID  . pantoprazole  40 mg Oral Daily  .  pravastatin  20 mg Oral q1800  . sodium chloride flush  3 mL Intravenous Q12H  . sodium chloride flush  3 mL Intravenous Q12H  . tamsulosin  0.4 mg Oral QPC breakfast  . tiotropium  1 capsule Inhalation  Daily   Continuous Infusions: . sodium chloride    . azithromycin Stopped (08/20/17 2246)  . cefTRIAXone (ROCEPHIN)  IV Stopped (08/20/17 2037)     LOS: 4 days    Time spent: 35 minutes. Greater than 50% of this time was spent in direct contact with the patient, patient's wife, son and daughter-in-law, coordinating care and discussing relevant ongoing clinical issues, including severeness of his baseline COPD, tenuous respiratory status, improvement of his pneumonia.        Lelon Frohlich, MD [facility] Pager [phone]  If 7PM-7AM, please contact night-coverage [email or web address] Password [facility] 08/21/2017, 10:38 AM

## 2017-08-21 NOTE — Consult Note (Signed)
Consultation Note Date: 08/21/2017   Patient Name: Nathaneal Sommers  DOB: Feb 18, 1951  MRN: [id number]  Age / Sex: 67 y.o., male  PCP: Chesley Noon, MD Referring Physician: Isaac Bliss, Olam Idler*  Reason for Consultation: Establishing goals of care  HPI/Patient Profile: 67 y.o. male  with past medical history of COPD with 4 L/min supplemental oxygen requirement at baseline, anxiety, and HTN, AAA stent admitted on 08/17/2017 with pneumonia.   Clinical Assessment and Goals of Care: Mr. Burch is resting quietly in bed.  He greets me making and keeping eye contact.  He is calm and cooperative.  Alert and oriented x3.  There is no family at bedside at this time. We talked about his home life.  Mr. 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  He  talks about his wife and stepdaughter.  Mr. Abee is not legally married, we talked about the benefits of healthcare power of attorney paperwork, see below.  He tells me that his wife is retired from a job in [place], he stopped working about 4 years ago, disabled.  He shares that his wife's daughter (he calls her his daughter) and her kids are around to help them as needed, she gets groceries.  Mr. Markey shares that he and his wife sometimes do laundry/cooks food, but they have help as needed.  Although Mr. Gaunt denies that, it sounds as though his stepdaughter and her children live in their home.  We talked about Mr. Arreguin end-stage COPD.  I share that every time he has a COPD flare, or pneumonia, it creates more scarring in his lungs, further difficulty breathing.  We talked about tobacco cessation.  Mr. Mecham states that he quit smoking, per nursing staff this was 4 days prior to hospital admit.  We talked about others in the home, are they smoking?  Mr. Affeldt states that they are going to quit, also.  He states that [place] smokes, but only a little  bit, and she states that she will go outside to smoke now.  We talked about CODE STATUS.  I share the concept of treat the treatable but no extraordinary measures.  Mr. Vandevoort states that he and Hassan Rowan have talked about CODE STATUS, he does not want to be kept alive if he has something that cannot be fixed.  We talked about his lung disease, that it is irreversible.  We talked about symptom management, increase of anxiolytics.  We also discussed the use of morphine for breathlessness.  I share that I will follow-up tomorrow to further discuss symptom management.  Sure that we do not want to make too many changes at one time.  Mr. Lowdermilk states that he is hopeful to discharge tomorrow morning.  Healthcare power of attorney NEXT OF KIN -Mr. Furukawa states that his desire is for his common-law wife, [name] to be his Media planner.  He shares that they are not married.  Mr. Bray was previously married, but has been divorced.  He does have adult natural  children, and also siblings.  We discussed the importance of completing healthcare power of attorney paperwork.  Consult for [facility] POA paperwork to be completed tomorrow.   SUMMARY OF RECOMMENDATIONS   At this point, to continue full scope treatment. Continue CODE STATUS discussions.  Code Status/Advance Care Planning:  Full code -we talked about the concept of "treat the treatable, but no extraordinary measures".  Symptom Management:   Anxiety medication increased to 3 times daily today by hospitalist.  Will consider the use of morphine if tolerates increase in anxiolytic.  Palliative Prophylaxis:   No special needs at this time.  Additional Recommendations (Limitations, Scope, Preferences):  Full Scope Treatment  Psycho-social/Spiritual:   Desire for further Chaplaincy support:yes  Additional Recommendations: Caregiving  Support/Resources and Education on Hospice  Prognosis:   < 6 months or less would not be surprising based on  decreasing functional status, end-stage COPD, frailty.   Discharge Planning: Anticipate return home with home health services.      Primary Diagnoses: Present on Admission: . Essential (primary) hypertension . Acute on chronic respiratory failure with hypoxia and hypercapnia (HCC) . COPD with acute exacerbation ([place]) . Pneumonia   I have reviewed the medical record, interviewed the patient and family, and examined the patient. The following aspects are pertinent.  Past Medical History:  Diagnosis Date  . AAA (abdominal aortic aneurysm) ([place])    infrarenal AAA  . Aortic aneurysm ([place])   . Arthritis   . Benign prostatic hyperplasia   . COPD (chronic obstructive pulmonary disease) ([place])   . Depression   . Dyspnea    on exertion  . GERD (gastroesophageal reflux disease)   . Headache   . Hypertension   . Myocardial infarction ([place]) 1980's  . On home oxygen therapy    "2l; 24/7 when I'm home" (07/27/2016)  . Panlobular emphysema ([place])   . Pneumonia    9/18  . Prostate disorder    Social History   Socioeconomic History  . Marital status: Significant Other    Spouse name: Not on file  . Number of children: Not on file  . Years of education: Not on file  . Highest education level: Not on file  Occupational History  . Not on file  Social Needs  . Financial resource strain: Not on file  . Food insecurity:    Worry: Not on file    Inability: Not on file  . Transportation needs:    Medical: Not on file    Non-medical: Not on file  Tobacco Use  . Smoking status: Current Every Day Smoker    Packs/day: 1.00    Years: 40.00    Pack years: 40.00    Types: Cigarettes  . Smokeless tobacco: Former Systems developer    Types: Snuff, Chew  . Tobacco comment: states stopped smoking 4 days ago  Substance and Sexual Activity  . Alcohol use: No    Alcohol/week: 0.0 oz  . Drug use: No  . Sexual activity: Not on file  Lifestyle  . Physical activity:    Days per week: Not on file     Minutes per session: Not on file  . Stress: Not on file  Relationships  . Social connections:    Talks on phone: Not on file    Gets together: Not on file    Attends religious service: Not on file    Active member of club or organization: Not on file    Attends meetings of clubs or organizations: Not on  file    Relationship status: Not on file  Other Topics Concern  . Not on file  Social History Narrative   Worked in plumbing, Firefighter, carpenting.   Family History  Problem Relation Age of Onset  . COPD Sister    Scheduled Meds: . ALPRAZolam  0.5 mg Oral Q8H  . aspirin EC  81 mg Oral Daily  . chlorhexidine  15 mL Mouth Rinse BID  . cholecalciferol  1,000 Units Oral Daily  . enoxaparin (LOVENOX) injection  40 mg Subcutaneous Q24H  . escitalopram  20 mg Oral Daily  . feeding supplement (ENSURE ENLIVE)  237 mL Oral TID BM  . finasteride  5 mg Oral QHS  . lisinopril  10 mg Oral QHS  . mouth rinse  15 mL Mouth Rinse q12n4p  . methylPREDNISolone (SOLU-MEDROL) injection  60 mg Intravenous Q6H  . mometasone-formoterol  2 puff Inhalation BID  . pantoprazole  40 mg Oral Daily  . pravastatin  20 mg Oral q1800  . sodium chloride flush  3 mL Intravenous Q12H  . sodium chloride flush  3 mL Intravenous Q12H  . tamsulosin  0.4 mg Oral QPC breakfast  . tiotropium  1 capsule Inhalation Daily   Continuous Infusions: . sodium chloride    . azithromycin Stopped (08/20/17 2246)  . cefTRIAXone (ROCEPHIN)  IV Stopped (08/20/17 2037)   PRN Meds:.sodium chloride, acetaminophen **OR** acetaminophen, albuterol, bisacodyl, HYDROcodone-acetaminophen, ondansetron **OR** ondansetron (ZOFRAN) IV, senna-docusate, sodium chloride flush, zolpidem Medications Prior to Admission:  Prior to Admission medications   Medication Sig Start Date End Date Taking? Authorizing Provider  albuterol (PROVENTIL HFA;VENTOLIN HFA) 108 (90 Base) MCG/ACT inhaler Inhale 2 puffs into the lungs every 6 (six) hours as  needed for wheezing or shortness of breath.   Yes [provider]  ALPRAZolam Duanne Moron) 1 MG tablet Take 1-2 mg at bedtime as needed by mouth for anxiety (sleep). Take 1 tablet (1 mg) by mouth daily at bedtime, may also take 1/2-1 tablet (0.5 - 1 mg) during the day as needed for anxiety    Yes [provider]  aspirin EC 81 MG tablet Take 81 mg by mouth daily.   Yes [provider]  Aspirin-Salicylamide-Caffeine (BC HEADACHE) 325-95-16 MG TABS Take 1 packet by mouth once as needed (FOR PAIN).   Yes [provider]  budesonide-formoterol (SYMBICORT) 160-4.5 MCG/ACT inhaler Inhale 1 puff into the lungs 2 (two) times daily.    Yes [provider]  butalbital-acetaminophen-caffeine (FIORICET, ESGIC) 50-325-40 MG tablet Take 2 tablets every 4 (four) hours as needed by mouth for headache.    Yes [provider]  Cholecalciferol (D 1000) 1000 units capsule Take 1,000 Units by mouth daily.    Yes [provider]  colchicine 0.6 MG tablet Take 0.6 mg by mouth every 6 (six) hours as needed (gout flares). Mitigare   Yes [provider]  escitalopram (LEXAPRO) 20 MG tablet Take 20 mg by mouth daily.   Yes [provider]  finasteride (PROSCAR) 5 MG tablet Take 5 mg by mouth at bedtime.  10/17/15  Yes [provider]  ipratropium-albuterol (DUONEB) 0.5-2.5 (3) MG/3ML SOLN Inhale 3 mLs into the lungs every 6 (six) hours as needed (shortness of breath/wheezing).  11/26/14  Yes [provider]  lisinopril (PRINIVIL,ZESTRIL) 10 MG tablet Take 10 mg by mouth at bedtime.   Yes [provider]  OXYGEN Inhale 3-4 L into the lungs continuous.    Yes [provider]  pantoprazole (  PROTONIX) 40 MG tablet Take 40 mg by mouth daily. Received from [facility]   Yes [provider]  pravastatin (PRAVACHOL) 20 MG tablet Take 1 tablet (20 mg total) by mouth at bedtime. Received from [facility] 12/07/16  Yes  Fay Records, MD  tamsulosin (FLOMAX) 0.4 MG CAPS capsule Take 0.4 mg by mouth daily. Received from [facility]    Yes [provider]  Tiotropium Bromide Monohydrate (SPIRIVA RESPIMAT) 2.5 MCG/ACT AERS Inhale 1 puff into the lungs daily.   Yes [provider]   Allergies  Allergen Reactions  . Ativan [Lorazepam] Other (See Comments)    hallucinations   Review of Systems  Unable to perform ROS: Acuity of condition    Physical Exam  Constitutional: He is oriented to person, place, and time. He appears ill.  Makes and keeps eye contact, appears acutely/chronically ill, appears much older than stated age  HENT:  Head: Atraumatic.  Some temporal wasting  Cardiovascular: Normal rate.  Pulmonary/Chest: Effort normal. No respiratory distress.  Abdominal: Soft. He exhibits no distension.  Musculoskeletal:       Right lower leg: He exhibits no edema.       Left lower leg: He exhibits no edema.  Neurological: He is alert and oriented to person, place, and time.  Skin: Skin is warm and dry.  Psychiatric: His mood appears not anxious. He is not agitated.  Nursing note and vitals reviewed.   Vital Signs: BP (!) 143/84   Pulse 75   Temp 98.1 F (36.7 C) (Oral)   Resp (!) 30   Ht 5\' 9"  (1.753 m)   Wt 55 kg (121 lb 4.1 oz)   SpO2 91%   BMI 17.91 kg/m  Pain Scale: 0-10 POSS *See Group Information*: 1-Acceptable,Awake and alert Pain Score: 0-No pain   SpO2: SpO2: 91 % O2 Device:SpO2: 91 % O2 Flow Rate: .O2 Flow Rate (L/min): 6 L/min  IO: Intake/output summary:   Intake/Output Summary (Last 24 hours) at 08/21/2017 1408 Last data filed at 08/21/2017 1100 Gross per 24 hour  Intake 1389.17 ml  Output 1100 ml  Net 289.17 ml    LBM: Last BM Date: 08/21/17 Baseline Weight: Weight: 57.6 kg (127 lb) Most recent weight: Weight: 55 kg (121 lb 4.1 oz)     Palliative Assessment/Data:   Flowsheet Rows     Most Recent Value  Intake Tab  Referral Department   Hospitalist  Unit at Time of Referral  Intermediate Care Unit  Palliative Care Primary Diagnosis  Pulmonary  Date Notified  08/21/17  Palliative Care Type  New Palliative care  Reason for referral  Clarify Goals of Care  Date of Admission  08/17/17  Date first seen by Palliative Care  08/21/17  # of days Palliative referral response time  0 Day(s)  # of days IP prior to Palliative referral  4  Clinical Assessment  Palliative Performance Scale Score  40%  Pain Max last 24 hours  Not able to report  Pain Min Last 24 hours  Not able to report  Dyspnea Max Last 24 Hours  Not able to report  Dyspnea Min Last 24 hours  Not able to report  Psychosocial & Spiritual Assessment  Palliative Care Outcomes  Patient/Family meeting held?  Yes  Who was at the meeting?  Patient and wife at bedside      Time In: 1450 Time Out: 1530 Time Total: 40 minutes Greater than 50%  of this time was spent counseling  and coordinating care related to the above assessment and plan.  Signed by: Drue Novel, NP   Please contact Palliative Medicine Team phone at [phone] for questions and concerns.  For individual provider: See Shea Evans

## 2017-08-22 DIAGNOSIS — J181 Lobar pneumonia, unspecified organism: Secondary | ICD-10-CM

## 2017-08-22 DIAGNOSIS — J9622 Acute and chronic respiratory failure with hypercapnia: Secondary | ICD-10-CM

## 2017-08-22 DIAGNOSIS — J9621 Acute and chronic respiratory failure with hypoxia: Secondary | ICD-10-CM

## 2017-08-22 DIAGNOSIS — J441 Chronic obstructive pulmonary disease with (acute) exacerbation: Principal | ICD-10-CM

## 2017-08-22 DIAGNOSIS — E43 Unspecified severe protein-calorie malnutrition: Secondary | ICD-10-CM

## 2017-08-22 LAB — BASIC METABOLIC PANEL
ANION GAP: 9 (ref 5–15)
BUN: 36 mg/dL — ABNORMAL HIGH (ref 6–20)
CO2: 34 mmol/L — AB (ref 22–32)
CREATININE: 0.74 mg/dL (ref 0.61–1.24)
Calcium: 8.9 mg/dL (ref 8.9–10.3)
Chloride: 95 mmol/L — ABNORMAL LOW (ref 101–111)
GFR calc Af Amer: >60 mL/min (ref >60)
Glucose, Bld: 154 mg/dL — ABNORMAL HIGH (ref 65–99)
Potassium: 4.9 mmol/L (ref 3.5–5.1)
SODIUM: 138 mmol/L (ref 135–145)

## 2017-08-22 LAB — CBC
HCT: 41.1 % (ref 39.0–52.0)
HEMOGLOBIN: 13.5 g/dL (ref 13.0–17.0)
MCH: 29.7 pg (ref 26.0–34.0)
MCHC: 32.8 g/dL (ref 30.0–36.0)
MCV: 90.3 fL (ref 78.0–100.0)
PLATELETS: 344 10*3/uL (ref 150–400)
RBC: 4.55 MIL/uL (ref 4.22–5.81)
RDW: 14.7 % (ref 11.5–15.5)
WBC: 10.6 10*3/uL — AB (ref 4.0–10.5)

## 2017-08-22 LAB — CULTURE, BLOOD (ROUTINE X 2)
Culture: NO GROWTH
Culture: NO GROWTH
SPECIAL REQUESTS: ADEQUATE
Special Requests: ADEQUATE

## 2017-08-22 LAB — GLUCOSE, CAPILLARY: GLUCOSE-CAPILLARY: 155 mg/dL — AB (ref 65–99)

## 2017-08-22 MED ORDER — METHYLPREDNISOLONE SODIUM SUCC 40 MG IJ SOLR
40.0000 mg | Freq: Two times a day (BID) | INTRAMUSCULAR | Status: DC
  Administered 2017-08-22 – 2017-08-23 (×2): 40 mg via INTRAVENOUS
  Filled 2017-08-22 (×2): qty 1

## 2017-08-22 NOTE — Progress Notes (Signed)
PROGRESS NOTE                                                                                                                                                                                                             Patient Demographics:    Jay Mullins, is a 67 y.o. male, DOB - 1950-12-18, OIZ:[id number]  Admit date - 08/17/2017   Admitting Physician Vianne Bulls, MD  Outpatient Primary MD for the patient is Chesley Noon, MD  LOS - 5  Outpatient Specialists: None  Chief Complaint  Patient presents with  . 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   Subjective:   Patient reports that his breathing has started to improve.  Has not required active BiPAP since yesterday.   Assessment  & Plan :    Active Problems:   Acute on chronic respiratory failure with hypoxia and hypercapnia (HCC) Secondary to pneumonia and COPD exacerbation. Starting to improve and feels getting closer to his baseline.  Continue 4 L via nasal cannula.  Was placed on BiPAP overnight that has helped his symptoms.  Reduce Solu-Medrol to 40 mg every 12 hours.  He is only getting as needed albuterol neb and should benefit from scheduled DuoNeb. Has received 6 days of empiric antibiotic and can be discontinued today.  Cultures including strep and urine Legionella antigen remain negative. Continue Xanax for anxiety.  Advanced COPD with chronic respiratory failure Continue treatment as above.  Would benefit from outpatient pulmonary evaluation.  Pulmonary recommends arranging noninvasive ventilator at home.     Essential (primary) hypertension Stable.  Continue lisinopril  Generalized  anxiety disorder Continue Lexapro and scheduled Xanax.    Goals of care, counseling/discussion Patient seen and discussed goals of care by palliative care.  Once full scope of treatment at present.    Code Status : Full code  Family Communication  : *none at bedside  Disposition Plan  : transfer to floor, possible home in 24-48 hrs  Barriers For Discharge : active symptoms  Consults  :  pulmonary  Procedures  : none  DVT Prophylaxis  :  Lovenox -   Lab Results  Component Value Date   PLT 344 08/22/2017    Antibiotics  :   Anti-infectives (From admission, onward)   Start     Dose/Rate Route Frequency Ordered Stop   08/17/17 2100  azithromycin (ZITHROMAX) 500 mg in sodium chloride 0.9 % 250 mL IVPB     500 mg 250 mL/hr over 60 Minutes Intravenous Every 24 hours 08/17/17 1919 08/24/17 2059   08/17/17 2000  cefTRIAXone (ROCEPHIN) 1 g in sodium chloride 0.9 % 100 mL IVPB     1 g 200 mL/hr over 30 Minutes Intravenous Every 24 hours 08/17/17 1919 08/24/17 1959   08/17/17 1745  vancomycin (VANCOCIN) IVPB 1000 mg/200 mL premix     1,000 mg 200 mL/hr over 60 Minutes Intravenous  Once 08/17/17 1732 08/17/17 1936   08/17/17 1745  piperacillin-tazobactam (ZOSYN) IVPB 3.375 g     3.375 g 100 mL/hr over 30 Minutes Intravenous  Once 08/17/17 1732 08/17/17 1816        Objective:   Vitals:   08/22/17 0800 08/22/17 0900 08/22/17 0902 08/22/17 0905  BP: 122/72 (!) 149/85    Pulse: 71 63    Resp: (!) 22 (!) 27    Temp: (!) 97.4 F (36.3 C)     TempSrc: Axillary     SpO2: 96% 96% 97% 97%  Weight:      Height:        Wt Readings from Last 3 Encounters:  08/21/17 55 kg (121 lb 4.1 oz)  03/07/17 57.7 kg (127 lb 1.6 oz)  01/29/17 59.2 kg (130 lb 8.2 oz)     Intake/Output Summary (Last 24 hours) at 08/22/2017 1228 Last data filed at 08/22/2017 0900 Gross per 24 hour  Intake 837.5 ml  Output 750 ml  Net 87.5 ml     Physical Exam  Gen: not in distress HEENT: temporal  wasting,  moist mucosa, supple neck Chest: few scattered rhonchi b/l CVS: N S1&S2, no murmurs, GI: soft, NT, ND,  Musculoskeletal: warm, no edema     Data Review:    CBC Recent Labs  Lab 08/17/17 1649 08/18/17 0432 08/20/17 0417 08/21/17 0413 08/22/17 0619  WBC 17.7* 15.8* 6.3 7.3 10.6*  HGB 12.4* 11.8* 11.6* 12.1* 13.5  HCT 40.2 38.1* 36.7* 38.5* 41.1  PLT 269 258 315 314 344  MCV 94.4 94.8 91.3 91.2 90.3  MCH 29.1 29.4 28.9 28.7 29.7  MCHC 30.8 31.0 31.6 31.4 32.8  RDW 15.0 15.1 15.0 15.0 14.7  LYMPHSABS 0.6*  --   --   --   --   MONOABS 1.4*  --   --   --   --   EOSABS 0.0  --   --   --   --   BASOSABS 0.0  --   --   --   --     Chemistries  Recent Labs  Lab 08/17/17 1649 08/20/17 0417 08/21/17 0413 08/22/17 0619  NA 139 137 138 138  K 4.1 3.9 4.9 4.9  CL 93* 95* 97* 95*  CO2 36* 35* 36* 34*  GLUCOSE 137* 175* 152* 154*  BUN 20 25* 33* 36*  CREATININE 0.72 0.63 0.78 0.74  CALCIUM 9.3 8.8* 8.8* 8.9  AST 11*  --   --   --   ALT 11*  --   --   --   ALKPHOS 65  --   --   --   BILITOT 0.9  --   --   --    ------------------------------------------------------------------------------------------------------------------  No results for input(s): CHOL, HDL, LDLCALC, TRIG, CHOLHDL, LDLDIRECT in the last 72 hours.  No results found for: HGBA1C ------------------------------------------------------------------------------------------------------------------ No results for input(s): TSH, T4TOTAL, T3FREE, THYROIDAB in the last 72 hours.  Invalid input(s): FREET3 ------------------------------------------------------------------------------------------------------------------ No results for input(s): VITAMINB12, FOLATE, FERRITIN, TIBC, IRON, RETICCTPCT in the last 72 hours.  Coagulation profile No results for input(s): INR, PROTIME in the last 168 hours.  No results for input(s): DDIMER in the last 72 hours.  Cardiac Enzymes Recent Labs  Lab 08/17/17 2233  08/18/17 0432 08/18/17 1151  TROPONINI <0.03 <0.03 <0.03   ------------------------------------------------------------------------------------------------------------------    Component Value Date/Time   BNP 206.0 (H) 08/17/2017 1649    Inpatient Medications  Scheduled Meds: . ALPRAZolam  0.5 mg Oral Q8H  . aspirin EC  81 mg Oral Daily  . chlorhexidine  15 mL Mouth Rinse BID  . cholecalciferol  1,000 Units Oral Daily  . enoxaparin (LOVENOX) injection  40 mg Subcutaneous Q24H  . escitalopram  20 mg Oral Daily  . feeding supplement (ENSURE ENLIVE)  237 mL Oral TID BM  . finasteride  5 mg Oral QHS  . lisinopril  10 mg Oral QHS  . mouth rinse  15 mL Mouth Rinse q12n4p  . methylPREDNISolone (SOLU-MEDROL) injection  60 mg Intravenous Q6H  . mometasone-formoterol  2 puff Inhalation BID  . pantoprazole  40 mg Oral Daily  . pravastatin  20 mg Oral q1800  . sodium chloride flush  3 mL Intravenous Q12H  . tamsulosin  0.4 mg Oral QPC breakfast  . tiotropium  1 capsule Inhalation Daily   Continuous Infusions: . sodium chloride    . azithromycin Stopped (08/21/17 2253)  . cefTRIAXone (ROCEPHIN)  IV Stopped (08/21/17 2035)   PRN Meds:.sodium chloride, acetaminophen **OR** acetaminophen, albuterol, bisacodyl, HYDROcodone-acetaminophen, ondansetron **OR** ondansetron (ZOFRAN) IV, senna-docusate, sodium chloride flush, zolpidem  Micro Results Recent Results (from the past 240 hour(s))  Culture, blood (Routine X 2) w Reflex to ID Panel     Status: None   Collection Time: 08/17/17  4:55 PM  Result Value Ref Range Status   Specimen Description RIGHT ANTECUBITAL  Final   Special Requests   Final    BOTTLES DRAWN AEROBIC AND ANAEROBIC Blood Culture adequate volume   Culture   Final    NO GROWTH 5 DAYS Performed at [facility], [address] [place], [place] [address]    Report Status 08/22/2017 FINAL  Final  Culture, blood (Routine X 2) w Reflex to ID Panel     Status: None    Collection Time: 08/17/17  4:56 PM  Result Value Ref Range Status   Specimen Description BLOOD RIGHT ARM  Final   Special Requests   Final    BOTTLES DRAWN AEROBIC AND ANAEROBIC Blood Culture adequate volume   Culture   Final    NO GROWTH 5 DAYS Performed at [place] [facility], [address] [place], [place] [address]    Report Status 08/22/2017 FINAL  Final  MRSA PCR Screening     Status: None   Collection Time: 08/17/17 10:10 PM  Result Value Ref Range Status   MRSA by PCR NEGATIVE NEGATIVE Final    Comment:        The GeneXpert MRSA Assay (FDA approved for NASAL specimens only), is one component of a comprehensive MRSA colonization surveillance program. It is not intended to diagnose MRSA infection nor to guide or monitor treatment for MRSA infections. Performed at [facility], [address], [place],  [place] [address]     Radiology Reports Dg Chest Port 1 View  Result Date: 08/20/2017 CLINICAL DATA:  Hypoxemia EXAM: PORTABLE CHEST 1 VIEW COMPARISON:  08/17/2017 FINDINGS: Emphysematous changes with bullous changes in the right lower lung. Continued hazy opacity over the mid and left lower lung, improved since prior study, likely improving pneumonia. Heart is normal size. No acute bony abnormality. IMPRESSION: Bullous emphysema. Improving opacity in the left mid and lower lung, likely improving pneumonia. Electronically Signed   By: Rolm Baptise M.D.   On: 08/20/2017 11:51   Dg Chest Port 1 View  Result Date: 08/17/2017 CLINICAL DATA:  Worsening shortness of breath throughout the day. Decreased oxygen saturation. EXAM: PORTABLE CHEST 1 VIEW COMPARISON:  PET CT scan 12/26/2016. PA and lateral chest 01/24/2017. FINDINGS: The lungs are severely emphysematous. Opacity is seen in the left upper lung zone. Large bullous lesion in the right lower lung zone is noted. Heart size is normal. No pneumothorax or pleural effusion. IMPRESSION: Hazy opacity left upper lung zone worrisome for  pneumonia in a patient with severe emphysema. Electronically Signed   By: Inge Rise M.D.   On: 08/17/2017 17:26    Time Spent in minutes  25   Virgilia Quigg M.D on 08/22/2017 at 12:28 PM  Between 7am to 7pm - Pager - [phone]  After 7pm go to [email or web address] - password [facility]  [facility] -  Office  [phone]

## 2017-08-22 NOTE — Consult Note (Signed)
Consult requested by: [facility] hospitalist Consult requested for: Respiratory failure/COPD  HPI: This is a 67 year old who came to the emergency department because of increased shortness of breath.  At baseline he is known to have pretty severe COPD and chronic hypoxic respiratory failure on 4 L of oxygen.  He also has a nebulizer at home.  He was found to have very low oxygen saturation when EMS was called found to be tachycardic.  Chest x-ray which I have personally reviewed showed left lower lobe pneumonia.  He was started on BiPAP which he has continued to require at night.  He is not having any chest pain.  No hemoptysis.  He says he feels like he is approaching baseline now.  He is not had nausea vomiting diarrhea headache.  He did have a sense of being feverish.  No swelling. Past Medical History:  Diagnosis Date  . AAA (abdominal aortic aneurysm) ([place])    infrarenal AAA  . Aortic aneurysm ([place])   . Arthritis   . Benign prostatic hyperplasia   . COPD (chronic obstructive pulmonary disease) ([place])   . Depression   . Dyspnea    on exertion  . GERD (gastroesophageal reflux disease)   . Headache   . Hypertension   . Myocardial infarction ([place]) 1980's  . On home oxygen therapy    "2l; 24/7 when I'm home" (07/27/2016)  . Panlobular emphysema ([place])   . Pneumonia    9/18  . Prostate disorder      Family History  Problem Relation Age of Onset  . COPD Sister      Social History   Socioeconomic History  . Marital status: Significant Other    Spouse name: Not on file  . Number of children: Not on file  . Years of education: Not on file  . Highest education level: Not on file  Occupational History  . Not on file  Social Needs  . Financial resource strain: Not on file  . Food insecurity:    Worry: Not on file    Inability: Not on file  . Transportation needs:    Medical: Not on file    Non-medical: Not on file  Tobacco Use  . Smoking status: Current Every Day Smoker     Packs/day: 1.00    Years: 40.00    Pack years: 40.00    Types: Cigarettes  . Smokeless tobacco: Former Systems developer    Types: Snuff, Chew  . Tobacco comment: states stopped smoking 4 days ago  Substance and Sexual Activity  . Alcohol use: No    Alcohol/week: 0.0 oz  . Drug use: No  . Sexual activity: Not on file  Lifestyle  . Physical activity:    Days per week: Not on file    Minutes per session: Not on file  . Stress: Not on file  Relationships  . Social connections:    Talks on phone: Not on file    Gets together: Not on file    Attends religious service: Not on file    Active member of club or organization: Not on file    Attends meetings of clubs or organizations: Not on file    Relationship status: Not on file  Other Topics Concern  . Not on file  Social History Narrative   Worked in plumbing, Firefighter, carpenting.     ROS: Except as mentioned 10 point review of systems is negative    Objective: Vital signs in last 24 hours: Temp:  [97.7  F (36.5 C)-98.1 F (36.7 C)] 97.7 F (36.5 C) (06/19 0500) Pulse Rate:  [55-108] 56 (06/19 0500) Resp:  [10-36] 22 (06/19 0500) BP: (105-156)/(78-103) 130/86 (06/19 0500) SpO2:  [85 %-98 %] 97 % (06/19 0500) Weight change:  Last BM Date: 08/21/17  Intake/Output from previous day: 06/18 0701 - 06/19 0700 In: 837.5 [P.O.:250; I.V.:20; IV Piggyback:567.5] Out: 1000 [Urine:1000]  PHYSICAL EXAM Constitutional: He is thin and in no acute distress.  Eyes: Pupils react EOMI.  Ears nose mouth and throat: Mucous membranes are moist.  Hearing is grossly normal.  Throat is clear.  Cardiovascular: His heart is regular with normal heart sounds.  Respiratory: His respiratory effort is increased and he has decreased breath sounds bilaterally and prolonged expiratory phase.  Gastrointestinal: His abdomen is soft with no masses.  Skin: Warm and dry.  Musculoskeletal: He is thin and generally weak but symmetrical.  Neurological: No focal  abnormalities.  Psychiatric: He is mildly anxious.  Lab Results: Basic Metabolic Panel: Recent Labs    08/21/17 0413 08/22/17 0619  NA 138 138  K 4.9 4.9  CL 97* 95*  CO2 36* 34*  GLUCOSE 152* 154*  BUN 33* 36*  CREATININE 0.78 0.74  CALCIUM 8.8* 8.9   Liver Function Tests: No results for input(s): AST, ALT, ALKPHOS, BILITOT, PROT, ALBUMIN in the last 72 hours. No results for input(s): LIPASE, AMYLASE in the last 72 hours. No results for input(s): AMMONIA in the last 72 hours. CBC: Recent Labs    08/21/17 0413 08/22/17 0619  WBC 7.3 10.6*  HGB 12.1* 13.5  HCT 38.5* 41.1  MCV 91.2 90.3  PLT 314 344   Cardiac Enzymes: No results for input(s): CKTOTAL, CKMB, CKMBINDEX, TROPONINI in the last 72 hours. BNP: No results for input(s): PROBNP in the last 72 hours. D-Dimer: No results for input(s): DDIMER in the last 72 hours. CBG: Recent Labs    08/20/17 0737 08/21/17 0733 08/22/17 0749  GLUCAP 145* 141* 155*   Hemoglobin A1C: No results for input(s): HGBA1C in the last 72 hours. Fasting Lipid Panel: No results for input(s): CHOL, HDL, LDLCALC, TRIG, CHOLHDL, LDLDIRECT in the last 72 hours. Thyroid Function Tests: No results for input(s): TSH, T4TOTAL, FREET4, T3FREE, THYROIDAB in the last 72 hours. Anemia Panel: No results for input(s): VITAMINB12, FOLATE, FERRITIN, TIBC, IRON, RETICCTPCT in the last 72 hours. Coagulation: No results for input(s): LABPROT, INR in the last 72 hours. Urine Drug Screen: Drugs of Abuse  No results found for: LABOPIA, COCAINSCRNUR, LABBENZ, AMPHETMU, THCU, LABBARB  Alcohol Level: No results for input(s): ETH in the last 72 hours. Urinalysis: No results for input(s): COLORURINE, LABSPEC, PHURINE, GLUCOSEU, HGBUR, BILIRUBINUR, KETONESUR, PROTEINUR, UROBILINOGEN, NITRITE, LEUKOCYTESUR in the last 72 hours.  Invalid input(s): APPERANCEUR Misc. Labs:   ABGS: Recent Labs    08/20/17 1025  PHART 7.443  PO2ART 63.8*  HCO3 31.7*      MICROBIOLOGY: Recent Results (from the past 240 hour(s))  Culture, blood (Routine X 2) w Reflex to ID Panel     Status: None   Collection Time: 08/17/17  4:55 PM  Result Value Ref Range Status   Specimen Description RIGHT ANTECUBITAL  Final   Special Requests   Final    BOTTLES DRAWN AEROBIC AND ANAEROBIC Blood Culture adequate volume   Culture   Final    NO GROWTH 5 DAYS Performed at [facility], [address] [place], [place] [address]    Report Status 08/22/2017 FINAL  Final  Culture, blood (  Routine X 2) w Reflex to ID Panel     Status: None   Collection Time: 08/17/17  4:56 PM  Result Value Ref Range Status   Specimen Description BLOOD RIGHT ARM  Final   Special Requests   Final    BOTTLES DRAWN AEROBIC AND ANAEROBIC Blood Culture adequate volume   Culture   Final    NO GROWTH 5 DAYS Performed at [facility], [address] [place], [place] [address]    Report Status 08/22/2017 FINAL  Final  MRSA PCR Screening     Status: None   Collection Time: 08/17/17 10:10 PM  Result Value Ref Range Status   MRSA by PCR NEGATIVE NEGATIVE Final    Comment:        The GeneXpert MRSA Assay (FDA approved for NASAL specimens only), is one component of a comprehensive MRSA colonization surveillance program. It is not intended to diagnose MRSA infection nor to guide or monitor treatment for MRSA infections. Performed at [facility] [address], [place], [place] [address]     Studies/Results: Dg Chest Port 1 View  Result Date: 08/20/2017 CLINICAL DATA:  Hypoxemia EXAM: PORTABLE CHEST 1 VIEW COMPARISON:  08/17/2017 FINDINGS: Emphysematous changes with bullous changes in the right lower lung. Continued hazy opacity over the mid and left lower lung, improved since prior study, likely improving pneumonia. Heart is normal size. No acute bony abnormality. IMPRESSION: Bullous emphysema. Improving opacity in the left mid and lower lung, likely improving pneumonia. Electronically  Signed   By: Rolm Baptise M.D.   On: 08/20/2017 11:51    Medications:  Prior to Admission:  Medications Prior to Admission  Medication Sig Dispense Refill Last Dose  . albuterol (PROVENTIL HFA;VENTOLIN HFA) 108 (90 Base) MCG/ACT inhaler Inhale 2 puffs into the lungs every 6 (six) hours as needed for wheezing or shortness of breath.   08/17/2017 at Unknown time  . ALPRAZolam (XANAX) 1 MG tablet Take 1-2 mg at bedtime as needed by mouth for anxiety (sleep). Take 1 tablet (1 mg) by mouth daily at bedtime, may also take 1/2-1 tablet (0.5 - 1 mg) during the day as needed for anxiety    08/17/2017 at Unknown time  . aspirin EC 81 MG tablet Take 81 mg by mouth daily.   08/17/2017 at Unknown time  . Aspirin-Salicylamide-Caffeine (BC HEADACHE) 325-95-16 MG TABS Take 1 packet by mouth once as needed (FOR PAIN).   08/17/2017 at Unknown time  . budesonide-formoterol (SYMBICORT) 160-4.5 MCG/ACT inhaler Inhale 1 puff into the lungs 2 (two) times daily.    08/17/2017 at Unknown time  . butalbital-acetaminophen-caffeine (FIORICET, ESGIC) 50-325-40 MG tablet Take 2 tablets every 4 (four) hours as needed by mouth for headache.    Past Week at Unknown time  . Cholecalciferol (D 1000) 1000 units capsule Take 1,000 Units by mouth daily.    08/17/2017 at Unknown time  . colchicine 0.6 MG tablet Take 0.6 mg by mouth every 6 (six) hours as needed (gout flares). Mitigare   Past Week at Unknown time  . escitalopram (LEXAPRO) 20 MG tablet Take 20 mg by mouth daily.   08/17/2017 at Unknown time  . finasteride (PROSCAR) 5 MG tablet Take 5 mg by mouth at bedtime.   3 08/16/2017 at Unknown time  . ipratropium-albuterol (DUONEB) 0.5-2.5 (3) MG/3ML SOLN Inhale 3 mLs into the lungs every 6 (six) hours as needed (shortness of breath/wheezing).    08/17/2017 at Unknown time  . lisinopril (PRINIVIL,ZESTRIL) 10 MG tablet Take  10 mg by mouth at bedtime.   08/16/2017 at Unknown time  . OXYGEN Inhale 3-4 L into the lungs continuous.    08/17/2017  at Unknown time  . pantoprazole (PROTONIX) 40 MG tablet Take 40 mg by mouth daily. Received from [facility]   08/17/2017 at Unknown time  . pravastatin (PRAVACHOL) 20 MG tablet Take 1 tablet (20 mg total) by mouth at bedtime. Received from [facility] 90 tablet 3 08/16/2017 at Unknown time  . tamsulosin (FLOMAX) 0.4 MG CAPS capsule Take 0.4 mg by mouth daily. Received from [facility]    08/17/2017 at Unknown time  . Tiotropium Bromide Monohydrate (SPIRIVA RESPIMAT) 2.5 MCG/ACT AERS Inhale 1 puff into the lungs daily.   08/17/2017 at Unknown time   Scheduled: . ALPRAZolam  0.5 mg Oral Q8H  . aspirin EC  81 mg Oral Daily  . chlorhexidine  15 mL Mouth Rinse BID  . cholecalciferol  1,000 Units Oral Daily  . enoxaparin (LOVENOX) injection  40 mg Subcutaneous Q24H  . escitalopram  20 mg Oral Daily  . feeding supplement (ENSURE ENLIVE)  237 mL Oral TID BM  . finasteride  5 mg Oral QHS  . lisinopril  10 mg Oral QHS  . mouth rinse  15 mL Mouth Rinse q12n4p  . methylPREDNISolone (SOLU-MEDROL) injection  60 mg Intravenous Q6H  . mometasone-formoterol  2 puff Inhalation BID  . pantoprazole  40 mg Oral Daily  . pravastatin  20 mg Oral q1800  . sodium chloride flush  3 mL Intravenous Q12H  . tamsulosin  0.4 mg Oral QPC breakfast  . tiotropium  1 capsule Inhalation Daily   Continuous: . sodium chloride    . azithromycin Stopped (08/21/17 2253)  . cefTRIAXone (ROCEPHIN)  IV Stopped (08/21/17 2035)   CHE:NIDPOE chloride, acetaminophen **OR** acetaminophen, albuterol, bisacodyl, HYDROcodone-acetaminophen, ondansetron **OR** ondansetron (ZOFRAN) IV, senna-docusate, sodium chloride flush, zolpidem  Assesment: He has pneumonia and COPD with acute exacerbation.  He has acute on chronic hypoxic and hypercapnic respiratory failure and is still requiring BiPAP at night.  He has severe protein calorie malnutrition related to his COPD. Principal Problem:   Pneumonia Active Problems:   Acute on chronic  respiratory failure with hypoxia and hypercapnia (HCC)   Essential (primary) hypertension   COPD with acute exacerbation (HCC)   Severe protein-calorie malnutrition ([place])   Goals of care, counseling/discussion   Palliative care by specialist   DNR (do not resuscitate) discussion    Plan: Continue current treatments.  He says he is approaching baseline.  He may need noninvasive ventilator at home.  Will discuss with case management.    LOS: 5 days   Jailynne Opperman L 08/22/2017, 8:24 AM

## 2017-08-22 NOTE — Care Management Important Message (Signed)
Important Message  Patient Details  Name: Coby Shrewsberry MRN: [id number] Date of Birth: Feb 23, 1951   Medicare Important Message Given:  Yes    Shelda Altes 08/22/2017, 12:13 PM

## 2017-08-22 NOTE — Progress Notes (Signed)
Palliative:  Mr. Jay Mullins is resting quietly in bed, watching television.  He greets me making and keeping eye contact.  He is calm and cooperative, pleasant.  There is no family at bedside at this time he does however, seem to have limited insight to his chronic disease process.  At this point, he has no questions or concerns.  He does tell me that his sister also has COPD, and she uses a trilogy machine.  He questions whether this would be helpful for him.  I share that we are still working on optimizing his health plan, pulmonology, Dr. Luan Pulling, will decide if this device could help him. We talked again today about CODE STATUS, life support. although Mr. Jay Mullins tells me he would not want life support, there is no family present at this time.  I share my worry about intubation, a tube into his lung to breathe for him.  I shared with the severity of his lung disease, if he needed a ventilator support, he likely would not be able to get off of the breathing machine.  I share that this would mean he would need to be in a facility/institution.  I ask if his significant other, Jay Mullins, could be present for meeting.  He shares that he will talk with her about what time is good for her.  It does not seem that Mr. Jay Mullins understands the severity of his illness, that he is at end stage with his lung disease, or the concepts of CODE STATUS fully.  I share that I will follow-up with him tomorrow for further questions and hopefully, a meeting with Jay Mullins. 25 minutes Jay Axe, NP Palliative Medicine Team Team Phone # [phone]

## 2017-08-22 NOTE — Progress Notes (Signed)
No BIPAP unit in room at this time. Ordered for PRN.

## 2017-08-22 NOTE — Care Management Note (Addendum)
Case Management Note  Patient Details  Name: Jay Mullins MRN: [id number] Date of Birth: 02/11/1951  Subjective/Objective:      Admitted with pneumonia. Pt from home with wife, ind with ADL's. Pt has home oxygen and neb machine through [facility]. He has no HH pta.. He has PCP and transportation.               Action/Plan: DC home in next 1-2 days. Pt will need NIV and [place] nursing for hospital follow up. Pt would like NIV from [place] and has no preference of [place] provider, okay with [facility]. CM has given NIV referral to Ashly, [employer] rep, who will pull info from Epic. Tim Justis, [facility] rep, given [facility] referral. Will update providers on DC date.    Expected Discharge Date:     08/23/2017             Expected Discharge Plan:  [place]  In-House Referral:  NA  Discharge planning Services  CM Consult  Post Acute Care Choice:  Durable Medical Equipment, Home Health Choice offered to:  Patient  DME Arranged:  NIV DME Agency:  [facility]  HH Arranged:  RN [place] Agency:  [facility] [employer]  Status of Service:  In process, will continue to follow  If discussed at Long Length of Stay Meetings, dates discussed:    Additional Comments:  Sherald Barge, RN 08/22/2017, 2:10 PM

## 2017-08-22 NOTE — Progress Notes (Signed)
Patient has chronic respiratory failure secondary to COPD. Non-invasive Ventilator is being ordered to prevent hypercapnia and subsequent readmissions/hospitalizations.

## 2017-08-23 DIAGNOSIS — Z7189 Other specified counseling: Secondary | ICD-10-CM

## 2017-08-23 DIAGNOSIS — J189 Pneumonia, unspecified organism: Secondary | ICD-10-CM | POA: Diagnosis present

## 2017-08-23 DIAGNOSIS — J181 Lobar pneumonia, unspecified organism: Secondary | ICD-10-CM

## 2017-08-23 LAB — GLUCOSE, CAPILLARY: Glucose-Capillary: 119 mg/dL — ABNORMAL HIGH (ref 65–99)

## 2017-08-23 MED ORDER — ENSURE ENLIVE PO LIQD: 237.0000 mL | Freq: Three times a day (TID) | ORAL | 12 refills | Status: DC

## 2017-08-23 MED ORDER — PREDNISONE 20 MG PO TABS: 20.0000 mg | ORAL_TABLET | Freq: Every day | ORAL | 0 refills | Status: DC

## 2017-08-23 NOTE — Discharge Instructions (Signed)

## 2017-08-23 NOTE — Discharge Summary (Signed)
Physician Discharge Summary  Jay Mullins IRJ:[id number] DOB: 1950/06/20 DOA: 08/17/2017  PCP: Chesley Noon, MD  Admit date: 08/17/2017 Discharge date: 08/23/2017  Admitted From: Home Disposition: Home  Recommendations for Outpatient Follow-up:  1. Follow up with PCP in 1-2 weeks.  Patient will be discharged on oral prednisone taper over the next 10-12 days. 2. Patient will follow-up with pulmonary as outpatient and get work-up in order to qualify for NIV for home use.  Home Health: RN and PT Equipment/Devices: Oxygen 4 L via nasal cannula  Discharge Condition: Fair CODE STATUS: DNR Diet recommendation: Regular with supplements   Discharge Diagnoses:  Principal Problem:   COPD with acute exacerbation ([place])   Active Problems:   Acute on chronic respiratory failure with hypoxia and hypercapnia (HCC)   Essential (primary) hypertension   Anxiety   Severe protein-calorie malnutrition (HCC)   Goals of care, counseling/discussion   Palliative care by specialist   Community acquired pneumonia of left lower lobe of lung ([place])  Brief narrative/HPI Please refer to admission H&P for details, in brief,67 year old male with COPD with chronic respiratory failure on 4 L home O2, hypertension and anxiety presented with acute on chronic hypoxic respiratory failure secondary to COPD exacerbation and superimposed pneumonia requiring BiPAP and ICU monitoring.   Hospital course  Principal problem:   Acute on chronic respiratory failure with hypoxia and hypercapnia (HCC) Secondary to pneumonia and COPD exacerbation. Required BiPAP frequently during hospital stay.  Now stable on chronic 4 L O2 via nasal cannula.  Symptoms better with IV Solu-Medrol, scheduled nebs and antibiotics.  Cultures remain negative.  He is now maintaining sats on 4 L via nasal cannula.  I will discharge him on oral prednisone taper over the next 12 days.  Resume home inhalers and nebs.  Continue Xanax for  anxiety. Long-term prognosis is guarded and patient is at high risk for rehospitalization.  Active problems Lobar pneumonia ([place])  completed 6 days of antibiotics.  Cultures remain negative.  Advanced COPD with chronic respiratory failure Pulmonologist recommended arranging noninvasive ventilator at home however due to insurance issues he can be provided with a equipment only for 3 months.  Plan is to get sleep study as outpatient.  Pulmonologist aware and will arrange outpatient follow-up.  Severe protein calorie malnutrition Added supplement    Essential (primary) hypertension Stable.  Continue lisinopril  Generalized anxiety disorder Continue Lexapro and Xanax.    Goals of care, counseling/discussion Palliative care consult appreciated.  Goals of care discussed in detail with patient and his wife.  Patient wishes to be DNR.  Patient is stable for discharge home with outpatient follow-up.  Family Communication  :  Wife at bedside  Disposition Plan  : Home  Consults  :  pulmonary, palliative care  Procedures  : none   Discharge Instructions   Allergies as of 08/23/2017      Reactions   Ativan [lorazepam] Other (See Comments)   hallucinations      Medication List    TAKE these medications   albuterol 108 (90 Base) MCG/ACT inhaler Commonly known as:  PROVENTIL HFA;VENTOLIN HFA Inhale 2 puffs into the lungs every 6 (six) hours as needed for wheezing or shortness of breath.   ALPRAZolam 1 MG tablet Commonly known as:  XANAX Take 1-2 mg at bedtime as needed by mouth for anxiety (sleep). Take 1 tablet (1 mg) by mouth daily at bedtime, may also take 1/2-1 tablet (0.5 - 1 mg) during the day as needed for anxiety  aspirin EC 81 MG tablet Take 81 mg by mouth daily.   BC HEADACHE 325-95-16 MG Tabs Generic drug:  Aspirin-Salicylamide-Caffeine Take 1 packet by mouth once as needed (FOR PAIN).   butalbital-acetaminophen-caffeine 50-325-40 MG tablet Commonly  known as:  FIORICET, ESGIC Take 2 tablets every 4 (four) hours as needed by mouth for headache.   colchicine 0.6 MG tablet Take 0.6 mg by mouth every 6 (six) hours as needed (gout flares). Mitigare   D 1000 1000 units capsule Generic drug:  Cholecalciferol Take 1,000 Units by mouth daily.   escitalopram 20 MG tablet Commonly known as:  LEXAPRO Take 20 mg by mouth daily.   feeding supplement (ENSURE ENLIVE) Liqd Take 237 mLs by mouth 3 (three) times daily between meals.   finasteride 5 MG tablet Commonly known as:  PROSCAR Take 5 mg by mouth at bedtime.   ipratropium-albuterol 0.5-2.5 (3) MG/3ML Soln Commonly known as:  DUONEB Inhale 3 mLs into the lungs every 6 (six) hours as needed (shortness of breath/wheezing).   lisinopril 10 MG tablet Commonly known as:  PRINIVIL,ZESTRIL Take 10 mg by mouth at bedtime.   OXYGEN Inhale 3-4 L into the lungs continuous.   pantoprazole 40 MG tablet Commonly known as:  PROTONIX Take 40 mg by mouth daily. Received from [facility]   pravastatin 20 MG tablet Commonly known as:  PRAVACHOL Take 1 tablet (20 mg total) by mouth at bedtime. Received from [facility]   predniSONE 20 MG tablet Commonly known as:  DELTASONE Take 1 tablet (20 mg total) by mouth daily with breakfast.   SPIRIVA RESPIMAT 2.5 MCG/ACT Aers Generic drug:  Tiotropium Bromide Monohydrate Inhale 1 puff into the lungs daily.   SYMBICORT 160-4.5 MCG/ACT inhaler Generic drug:  budesonide-formoterol Inhale 1 puff into the lungs 2 (two) times daily.   tamsulosin 0.4 MG Caps capsule Commonly known as:  FLOMAX Take 0.4 mg by mouth daily. Received from [facility]    Chesley Noon, MD On 08/30/2017.   Specialty:  Family Medicine Why:  at 11:45 am Please obtain BMP/CBC in one week    Contact information: [place] [address] [phone]        Sinda Du, MD On 09/04/2017.   Specialty:  Pulmonary  Disease Why:  at 9:00 am. Arrange sleep study Contact information: [place] [address] [phone]        [facility], [facility] Follow up.   Specialty:  [facility] Contact information: [address] [place] [address] [phone]          Allergies  Allergen Reactions  . Ativan [Lorazepam] Other (See Comments)    hallucinations     Procedures/Studies: Dg Chest Port 1 View  Result Date: 08/20/2017 CLINICAL DATA:  Hypoxemia EXAM: PORTABLE CHEST 1 VIEW COMPARISON:  08/17/2017 FINDINGS: Emphysematous changes with bullous changes in the right lower lung. Continued hazy opacity over the mid and left lower lung, improved since prior study, likely improving pneumonia. Heart is normal size. No acute bony abnormality. IMPRESSION: Bullous emphysema. Improving opacity in the left mid and lower lung, likely improving pneumonia. Electronically Signed   By: Rolm Baptise M.D.   On: 08/20/2017 11:51   Dg Chest Port 1 View  Result Date: 08/17/2017 CLINICAL DATA:  Worsening shortness of breath throughout the day. Decreased oxygen saturation. EXAM: PORTABLE CHEST 1 VIEW COMPARISON:  PET CT scan 12/26/2016. PA  and lateral chest 01/24/2017. FINDINGS: The lungs are severely emphysematous. Opacity is seen in the left upper lung zone. Large bullous lesion in the right lower lung zone is noted. Heart size is normal. No pneumothorax or pleural effusion. IMPRESSION: Hazy opacity left upper lung zone worrisome for pneumonia in a patient with severe emphysema. Electronically Signed   By: Inge Rise M.D.   On: 08/17/2017 17:26      Subjective: Reports breathing better.  Has not required BiPAP for past 2 days.  (Using BiPAP empirically during the night only)  Discharge Exam: Vitals:   08/23/17 0848 08/23/17 0900  BP:    Pulse:    Resp:    Temp:    SpO2: 91% 91%   Vitals:   08/22/17 2227 08/23/17 0600 08/23/17 0848 08/23/17 0900  BP: (!)  144/95 131/75    Pulse: 78 (!) 59    Resp: 16 14    Temp: 97.9 F (36.6 C) 98 F (36.7 C)    TempSrc: Oral Oral    SpO2: 96% 100% 91% 91%  Weight:      Height:        General: Middle aged thin built male not in distress HEENT: Temporal wasting, moist mucosa, supple neck Chest: Few scattered rhonchi CVS: Normal S1 and S2, no murmurs GI: Soft, nondistended, nontender Musculoskeletal: Warm, no edema   The results of significant diagnostics from this hospitalization (including imaging, microbiology, ancillary and laboratory) are listed below for reference.     Microbiology: Recent Results (from the past 240 hour(s))  Culture, blood (Routine X 2) w Reflex to ID Panel     Status: None   Collection Time: 08/17/17  4:55 PM  Result Value Ref Range Status   Specimen Description RIGHT ANTECUBITAL  Final   Special Requests   Final    BOTTLES DRAWN AEROBIC AND ANAEROBIC Blood Culture adequate volume   Culture   Final    NO GROWTH 5 DAYS Performed at [facility], [address] [place], [place] [address]    Report Status 08/22/2017 FINAL  Final  Culture, blood (Routine X 2) w Reflex to ID Panel     Status: None   Collection Time: 08/17/17  4:56 PM  Result Value Ref Range Status   Specimen Description BLOOD RIGHT ARM  Final   Special Requests   Final    BOTTLES DRAWN AEROBIC AND ANAEROBIC Blood Culture adequate volume   Culture   Final    NO GROWTH 5 DAYS Performed at [facility], [address], [place], [place] [address]    Report Status 08/22/2017 FINAL  Final  MRSA PCR Screening     Status: None   Collection Time: 08/17/17 10:10 PM  Result Value Ref Range Status   MRSA by PCR NEGATIVE NEGATIVE Final    Comment:        The GeneXpert MRSA Assay (FDA approved for NASAL specimens only), is one component of a comprehensive MRSA colonization surveillance program. It is not intended to diagnose MRSA infection nor to guide or monitor treatment for MRSA infections. Performed  at [facility], [address], [place],  [address]      Labs: BNP (last 3 results) Recent Labs    08/17/17 1649  BNP 144.3*   Basic Metabolic Panel: Recent Labs  Lab 08/17/17 1649 08/20/17 0417 08/21/17 0413 08/22/17 0619  NA 139 137 138 138  K 4.1 3.9 4.9 4.9  CL 93* 95* 97* 95*  CO2 36* 35* 36*  34*  GLUCOSE 137* 175* 152* 154*  BUN 20 25* 33* 36*  CREATININE 0.72 0.63 0.78 0.74  CALCIUM 9.3 8.8* 8.8* 8.9   Liver Function Tests: Recent Labs  Lab 08/17/17 1649  AST 11*  ALT 11*  ALKPHOS 65  BILITOT 0.9  PROT 6.9  ALBUMIN 3.6   No results for input(s): LIPASE, AMYLASE in the last 168 hours. No results for input(s): AMMONIA in the last 168 hours. CBC: Recent Labs  Lab 08/17/17 1649 08/18/17 0432 08/20/17 0417 08/21/17 0413 08/22/17 0619  WBC 17.7* 15.8* 6.3 7.3 10.6*  NEUTROABS 15.7*  --   --   --   --   HGB 12.4* 11.8* 11.6* 12.1* 13.5  HCT 40.2 38.1* 36.7* 38.5* 41.1  MCV 94.4 94.8 91.3 91.2 90.3  PLT 269 258 315 314 344   Cardiac Enzymes: Recent Labs  Lab 08/17/17 1649 08/17/17 2233 08/18/17 0432 08/18/17 1151  TROPONINI 0.03* <0.03 <0.03 <0.03   BNP: Invalid input(s): POCBNP CBG: Recent Labs  Lab 08/20/17 0737 08/21/17 0733 08/22/17 0749 08/23/17 0808  GLUCAP 145* 141* 155* 119*   D-Dimer No results for input(s): DDIMER in the last 72 hours. Hgb A1c No results for input(s): HGBA1C in the last 72 hours. Lipid Profile No results for input(s): CHOL, HDL, LDLCALC, TRIG, CHOLHDL, LDLDIRECT in the last 72 hours. Thyroid function studies No results for input(s): TSH, T4TOTAL, T3FREE, THYROIDAB in the last 72 hours.  Invalid input(s): FREET3 Anemia work up No results for input(s): VITAMINB12, FOLATE, FERRITIN, TIBC, IRON, RETICCTPCT in the last 72 hours. Urinalysis    Component Value Date/Time   COLORURINE AMBER (A) 11/19/2016 0230   APPEARANCEUR HAZY (A) 11/19/2016 0230   LABSPEC 1.032 (H) 11/19/2016 0230   PHURINE 5.0  11/19/2016 0230   GLUCOSEU NEGATIVE 11/19/2016 0230   HGBUR LARGE (A) 11/19/2016 0230   BILIRUBINUR NEGATIVE 11/19/2016 0230   KETONESUR NEGATIVE 11/19/2016 0230   PROTEINUR 100 (A) 11/19/2016 0230   UROBILINOGEN 1.0 12/13/2014 1248   NITRITE NEGATIVE 11/19/2016 0230   LEUKOCYTESUR NEGATIVE 11/19/2016 0230   Sepsis Labs Invalid input(s): PROCALCITONIN,  WBC,  LACTICIDVEN Microbiology Recent Results (from the past 240 hour(s))  Culture, blood (Routine X 2) w Reflex to ID Panel     Status: None   Collection Time: 08/17/17  4:55 PM  Result Value Ref Range Status   Specimen Description RIGHT ANTECUBITAL  Final   Special Requests   Final    BOTTLES DRAWN AEROBIC AND ANAEROBIC Blood Culture adequate volume   Culture   Final    NO GROWTH 5 DAYS Performed at [facility], [address] [place], [place] [address]    Report Status 08/22/2017 FINAL  Final  Culture, blood (Routine X 2) w Reflex to ID Panel     Status: None   Collection Time: 08/17/17  4:56 PM  Result Value Ref Range Status   Specimen Description BLOOD RIGHT ARM  Final   Special Requests   Final    BOTTLES DRAWN AEROBIC AND ANAEROBIC Blood Culture adequate volume   Culture   Final    NO GROWTH 5 DAYS Performed at [facility], [address] [place], [place] [address]    Report Status 08/22/2017 FINAL  Final  MRSA PCR Screening     Status: None   Collection Time: 08/17/17 10:10 PM  Result Value Ref Range Status   MRSA by PCR NEGATIVE NEGATIVE Final    Comment:        The GeneXpert  MRSA Assay (FDA approved for NASAL specimens only), is one component of a comprehensive MRSA colonization surveillance program. It is not intended to diagnose MRSA infection nor to guide or monitor treatment for MRSA infections. Performed at [facility], [address], [place], [place] [address]      Time coordinating discharge: 35 minutes  SIGNED:   Louellen Molder, MD  [facility] 08/23/2017, 2:16 PM Pager   If  7PM-7AM, please contact night-coverage [email or web address] Password [email or web address]

## 2017-08-23 NOTE — Progress Notes (Signed)
Palliative: Jay Mullins is resting quietly in bed.  He greets me making and keeping eye contact.  He appears as usual, chronically ill and frail.  Present today at bedside is his significant other of many years, Jay Mullins.  We talked about likely discharge today, home health and equipment needs set up through case management.  No questions at this time. Jay Mullins completes healthcare power of attorney/advanced directive paperwork with me today.  He names his significant other, Jay Mullins as his Ambulance person.  We also talked about CODE STATUS.  We talked about the realities of CPR and intubation.  Jay Mullins elects a natural death, "treat the treatable, but no CPR, no intubation" (DNR).  Family at bedside agrees that this is a good choice for Jay Mullins.  This is completed in detail in his advanced directive paperwork.  We discussed keeping a copy of this paperwork on file for the hospital, and family keeping the original in a location where they can access it easily.  He elects DNR.  Chaplain notified of need for notary.  No further questions or concerns at this time. Conference with hospitalist related to plan of care, advance directives. 25 minutes [name], NP Palliative Medicine Team Team Phone # [phone]

## 2017-08-23 NOTE — Care Management Note (Signed)
Case Management Note  Patient Details  Name: Jamarcus Laduke MRN: [id number] Date of Birth: 09/20/1950   Expected Discharge Date:  08/23/17               Expected Discharge Plan:  [place]  In-House Referral:  NA  Discharge planning Services  CM Consult  Post Acute Care Choice:  Durable Medical Equipment, Home Health Choice offered to:  Patient  DME Arranged:    DME Agency:     HH Arranged:  [place] Agency:  [facility] ([facility] [employer])  Status of Service:  Completed, signed off  If discussed at [employer], dates discussed:    Additional Comments: DC home today with [facility] referral. [facility] rep, aware of referral and DC today. Pt aware HH has 48 hrs to make first visit. CM contacted by [employer] rep this AM saying [employer] will only authorize NIV for 3 months. They will not supply NIV knowing they will have to take it back after 3 months. Pt will have to be referred for sleep study through Dr. Luan Pulling, office after DC. Discussed this plan with pt, wife, other family at bedside, attending and pulmonologist.   Sherald Barge, RN 08/23/2017, 12:45 PM

## 2017-08-23 NOTE — Progress Notes (Signed)
Patient's IV removed.  Site WNL.  Prescription given to patient.  AVS reviewed with patient.  Verbalized understanding of d/c instructions, physician follow-up, medications.  Patient's wife brought home oxygen for discharge home.  Patient transported by NT via w/c to main entrance at discharge.  Patient stable at time of discharge.

## 2017-08-23 NOTE — ACP (Advance Care Planning) (Signed)
Completed Jay Mullins Advance Directives, giving him original and 5 copies. Placed a copy in his chart.

## 2017-08-23 NOTE — Progress Notes (Signed)
Subjective: He says he feels okay.  His family is at bedside.  He did not use BiPAP last night.  Family says that he does get very sleepy during the day sometimes and that he is sometimes difficult to arouse.  I think he likely has episodes of increased PCO2 and I do think he will benefit from noninvasive ventilator particularly use at night.  That may help keep him out of the hospital.  Objective: Vital signs in last 24 hours: Temp:  [97.6 F (36.4 C)-98 F (36.7 C)] 98 F (36.7 C) (06/20 0600) Pulse Rate:  [59-80] 59 (06/20 0600) Resp:  [14-32] 14 (06/20 0600) BP: (131-149)/(75-95) 131/75 (06/20 0600) SpO2:  [84 %-100 %] 100 % (06/20 0600) Weight change:  Last BM Date: 08/21/17  Intake/Output from previous day: 06/19 0701 - 06/20 0700 In: -  Out: 650 [Urine:650]  PHYSICAL EXAM General appearance: alert, cooperative and no distress Resp: clear to auscultation bilaterally Cardio: regular rate and rhythm, S1, S2 normal, no murmur, click, rub or gallop GI: soft, non-tender; bowel sounds normal; no masses,  no organomegaly Extremities: extremities normal, atraumatic, no cyanosis or edema  Lab Results:  Results for orders placed or performed during the hospital encounter of 08/17/17 (from the past 48 hour(s))  Basic metabolic panel     Status: Abnormal   Collection Time: 08/22/17  6:19 AM  Result Value Ref Range   Sodium 138 135 - 145 mmol/L   Potassium 4.9 3.5 - 5.1 mmol/L   Chloride 95 (L) 101 - 111 mmol/L   CO2 34 (H) 22 - 32 mmol/L   Glucose, Bld 154 (H) 65 - 99 mg/dL   BUN 36 (H) 6 - 20 mg/dL   Creatinine, Ser 0.74 0.61 - 1.24 mg/dL   Calcium 8.9 8.9 - 10.3 mg/dL   GFR calc non Af Amer >60 >60 mL/min   GFR calc Af Amer >60 >60 mL/min    Comment: (NOTE) The eGFR has been calculated using the CKD EPI equation. This calculation has not been validated in all clinical situations. eGFR's persistently <60 mL/min signify possible Chronic Kidney Disease.    Anion gap 9 5 -  15    Comment: Performed at [facility], [address] [place], [place] [address]  CBC     Status: Abnormal   Collection Time: 08/22/17  6:19 AM  Result Value Ref Range   WBC 10.6 (H) 4.0 - 10.5 K/uL   RBC 4.55 4.22 - 5.81 MIL/uL   Hemoglobin 13.5 13.0 - 17.0 g/dL   HCT 41.1 39.0 - 52.0 %   MCV 90.3 78.0 - 100.0 fL   MCH 29.7 26.0 - 34.0 pg   MCHC 32.8 30.0 - 36.0 g/dL   RDW 14.7 11.5 - 15.5 %   Platelets 344 150 - 400 K/uL    Comment: Performed at [facility], [address], [place], [place] [address]  Glucose, capillary     Status: Abnormal   Collection Time: 08/22/17  7:49 AM  Result Value Ref Range   Glucose-Capillary 155 (H) 65 - 99 mg/dL    ABGS Recent Labs    08/20/17 1025  PHART 7.443  PO2ART 63.8*  HCO3 31.7*   CULTURES Recent Results (from the past 240 hour(s))  Culture, blood (Routine X 2) w Reflex to ID Panel     Status: None   Collection Time: 08/17/17  4:55 PM  Result Value Ref Range Status   Specimen Description RIGHT ANTECUBITAL  Final   Special  Requests   Final    BOTTLES DRAWN AEROBIC AND ANAEROBIC Blood Culture adequate volume   Culture   Final    NO GROWTH 5 DAYS Performed at [facility], [address] [place], [place] [address]    Report Status 08/22/2017 FINAL  Final  Culture, blood (Routine X 2) w Reflex to ID Panel     Status: None   Collection Time: 08/17/17  4:56 PM  Result Value Ref Range Status   Specimen Description BLOOD RIGHT ARM  Final   Special Requests   Final    BOTTLES DRAWN AEROBIC AND ANAEROBIC Blood Culture adequate volume   Culture   Final    NO GROWTH 5 DAYS Performed at [facility], [address], [place], [place] [address]    Report Status 08/22/2017 FINAL  Final  MRSA PCR Screening     Status: None   Collection Time: 08/17/17 10:10 PM  Result Value Ref Range Status   MRSA by PCR NEGATIVE NEGATIVE Final    Comment:        The GeneXpert MRSA Assay (FDA approved for NASAL specimens only), is one component of  a comprehensive MRSA colonization surveillance program. It is not intended to diagnose MRSA infection nor to guide or monitor treatment for MRSA infections. Performed at [facility], [address], [place], [place] [address]    Studies/Results: No results found.  Medications:  Prior to Admission:  Medications Prior to Admission  Medication Sig Dispense Refill Last Dose  . albuterol (PROVENTIL HFA;VENTOLIN HFA) 108 (90 Base) MCG/ACT inhaler Inhale 2 puffs into the lungs every 6 (six) hours as needed for wheezing or shortness of breath.   08/17/2017 at Unknown time  . ALPRAZolam (XANAX) 1 MG tablet Take 1-2 mg at bedtime as needed by mouth for anxiety (sleep). Take 1 tablet (1 mg) by mouth daily at bedtime, may also take 1/2-1 tablet (0.5 - 1 mg) during the day as needed for anxiety    08/17/2017 at Unknown time  . aspirin EC 81 MG tablet Take 81 mg by mouth daily.   08/17/2017 at Unknown time  . Aspirin-Salicylamide-Caffeine (BC HEADACHE) 325-95-16 MG TABS Take 1 packet by mouth once as needed (FOR PAIN).   08/17/2017 at Unknown time  . budesonide-formoterol (SYMBICORT) 160-4.5 MCG/ACT inhaler Inhale 1 puff into the lungs 2 (two) times daily.    08/17/2017 at Unknown time  . butalbital-acetaminophen-caffeine (FIORICET, ESGIC) 50-325-40 MG tablet Take 2 tablets every 4 (four) hours as needed by mouth for headache.    Past Week at Unknown time  . Cholecalciferol (D 1000) 1000 units capsule Take 1,000 Units by mouth daily.    08/17/2017 at Unknown time  . colchicine 0.6 MG tablet Take 0.6 mg by mouth every 6 (six) hours as needed (gout flares). Mitigare   Past Week at Unknown time  . escitalopram (LEXAPRO) 20 MG tablet Take 20 mg by mouth daily.   08/17/2017 at Unknown time  . finasteride (PROSCAR) 5 MG tablet Take 5 mg by mouth at bedtime.   3 08/16/2017 at Unknown time  . ipratropium-albuterol (DUONEB) 0.5-2.5 (3) MG/3ML SOLN Inhale 3 mLs into the lungs every 6 (six) hours as needed (shortness of  breath/wheezing).    08/17/2017 at Unknown time  . lisinopril (PRINIVIL,ZESTRIL) 10 MG tablet Take 10 mg by mouth at bedtime.   08/16/2017 at Unknown time  . OXYGEN Inhale 3-4 L into the lungs continuous.    08/17/2017 at Unknown time  . pantoprazole (PROTONIX)  40 MG tablet Take 40 mg by mouth daily. Received from [facility]   08/17/2017 at Unknown time  . pravastatin (PRAVACHOL) 20 MG tablet Take 1 tablet (20 mg total) by mouth at bedtime. Received from [facility] 90 tablet 3 08/16/2017 at Unknown time  . tamsulosin (FLOMAX) 0.4 MG CAPS capsule Take 0.4 mg by mouth daily. Received from [facility]    08/17/2017 at Unknown time  . Tiotropium Bromide Monohydrate (SPIRIVA RESPIMAT) 2.5 MCG/ACT AERS Inhale 1 puff into the lungs daily.   08/17/2017 at Unknown time   Scheduled: . ALPRAZolam  0.5 mg Oral Q8H  . aspirin EC  81 mg Oral Daily  . chlorhexidine  15 mL Mouth Rinse BID  . cholecalciferol  1,000 Units Oral Daily  . enoxaparin (LOVENOX) injection  40 mg Subcutaneous Q24H  . escitalopram  20 mg Oral Daily  . feeding supplement (ENSURE ENLIVE)  237 mL Oral TID BM  . finasteride  5 mg Oral QHS  . lisinopril  10 mg Oral QHS  . mouth rinse  15 mL Mouth Rinse q12n4p  . methylPREDNISolone (SOLU-MEDROL) injection  40 mg Intravenous Q12H  . mometasone-formoterol  2 puff Inhalation BID  . pantoprazole  40 mg Oral Daily  . pravastatin  20 mg Oral q1800  . sodium chloride flush  3 mL Intravenous Q12H  . tamsulosin  0.4 mg Oral QPC breakfast  . tiotropium  1 capsule Inhalation Daily   Continuous: . sodium chloride     KGS:UPJSRP chloride, acetaminophen **OR** acetaminophen, albuterol, bisacodyl, HYDROcodone-acetaminophen, ondansetron **OR** ondansetron (ZOFRAN) IV, senna-docusate, sodium chloride flush, zolpidem  Assesment: He was admitted with pneumonia and COPD exacerbation.  He had acute on chronic hypoxic and hypercapnic respiratory failure.  He is improved.  Long discussion with he and his  family today about the fact that he has severe COPD and that his lungs are not able to remove CO2 or provide oxygen as well as they should.  He says he is back at baseline.  He has severe protein calorie malnutrition and is on nutritional supplements Principal Problem:   Pneumonia Active Problems:   Acute on chronic respiratory failure with hypoxia and hypercapnia (HCC)   Essential (primary) hypertension   COPD with acute exacerbation (HCC)   Severe protein-calorie malnutrition ([place])   Goals of care, counseling/discussion   Palliative care by specialist   DNR (do not resuscitate) discussion    Plan: I think he is approaching discharge.  I will plan to sign off.  Thanks for allowing me to see him with you    LOS: 6 days   Kashton Mcartor L 08/23/2017, 8:36 AM

## 2017-09-04 ENCOUNTER — Other Ambulatory Visit (HOSPITAL_COMMUNITY): Payer: Self-pay | Admitting: Pulmonary Disease

## 2017-09-04 ENCOUNTER — Ambulatory Visit (HOSPITAL_COMMUNITY)
Admission: RE | Admit: 2017-09-04 | Discharge: 2017-09-04 | Disposition: A | Discharge status: Home / Self Care | Payer: Medicare Other | Source: Ambulatory Visit | Attending: Pulmonary Disease | Admitting: Pulmonary Disease

## 2017-09-04 DIAGNOSIS — J181 Lobar pneumonia, unspecified organism: Secondary | ICD-10-CM | POA: Diagnosis not present

## 2017-11-01 ENCOUNTER — Inpatient Hospital Stay: Payer: Medicare Other | Admitting: Internal Medicine

## 2017-11-08 ENCOUNTER — Inpatient Hospital Stay: Payer: Medicare Other | Admitting: Internal Medicine

## 2017-11-12 ENCOUNTER — Ambulatory Visit: Payer: Medicare Other | Admitting: Internal Medicine

## 2017-11-12 ENCOUNTER — Other Ambulatory Visit: Payer: Self-pay | Admitting: *Deleted

## 2017-11-12 DIAGNOSIS — D329 Benign neoplasm of meninges, unspecified: Secondary | ICD-10-CM

## 2017-11-12 NOTE — Progress Notes (Signed)
Canceled patients appointment today with Dr Mickeal Skinner.  Need scan before visit.

## 2017-12-04 ENCOUNTER — Other Ambulatory Visit: Payer: Self-pay | Admitting: Radiation Therapy

## 2017-12-06 ENCOUNTER — Ambulatory Visit
Admission: RE | Admit: 2017-12-06 | Discharge: 2017-12-06 | Disposition: A | Discharge status: Home / Self Care | Payer: Medicare Other | Source: Ambulatory Visit | Attending: Internal Medicine | Admitting: Internal Medicine

## 2017-12-06 DIAGNOSIS — D329 Benign neoplasm of meninges, unspecified: Secondary | ICD-10-CM

## 2017-12-06 MED ORDER — GADOBENATE DIMEGLUMINE 529 MG/ML IV SOLN
12.0000 mL | Freq: Once | INTRAVENOUS | Status: AC | PRN
  Administered 2017-12-06: 12 mL via INTRAVENOUS

## 2017-12-10 ENCOUNTER — Encounter: Payer: Self-pay | Admitting: Internal Medicine

## 2017-12-10 ENCOUNTER — Telehealth: Payer: Self-pay

## 2017-12-10 ENCOUNTER — Inpatient Hospital Stay: Payer: Medicare Other | Attending: Internal Medicine | Admitting: Internal Medicine

## 2017-12-10 ENCOUNTER — Other Ambulatory Visit: Payer: Medicare Other

## 2017-12-10 VITALS — BP 123/84 | HR 90 | Temp 98.3°F | Resp 18 | Ht 69.0 in | Wt 133.7 lb

## 2017-12-10 DIAGNOSIS — D329 Benign neoplasm of meninges, unspecified: Secondary | ICD-10-CM

## 2017-12-10 DIAGNOSIS — Z7982 Long term (current) use of aspirin: Secondary | ICD-10-CM | POA: Insufficient documentation

## 2017-12-10 DIAGNOSIS — R51 Headache: Secondary | ICD-10-CM | POA: Diagnosis not present

## 2017-12-10 DIAGNOSIS — Z79899 Other long term (current) drug therapy: Secondary | ICD-10-CM

## 2017-12-10 DIAGNOSIS — Z87891 Personal history of nicotine dependence: Secondary | ICD-10-CM | POA: Diagnosis not present

## 2017-12-10 NOTE — Telephone Encounter (Signed)
Spoke with patient and he is aware of the upcoming appointment. Will also mail a letter with a calender enclosed. Per 10/7 los

## 2017-12-10 NOTE — Progress Notes (Signed)
[place] at [facility] [place], [place] [address] [phone]   New Patient Evaluation  Date of Service: 12/10/17 Patient Name: Dayle Mcnerney Patient MRN: [id number] Patient DOB: 06-12-1950 Provider: Ventura Sellers, MD  Identifying Statement:  Wilbert Hayashi is a 67 y.o. male with left frontal meningioma who presents for initial consultation and evaluation.    Referring Provider: Chesley Noon, MD [address] [place],  [address]  Oncologic History: 07/14/13: Craniotomy, resection at [facility] Rumps).  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  Kaveon Blatz presents today for mengioma follow up, as a referral from Dr. Courtney Paris at [facility].  Patient elected to seek care closer to home.  He describes no neurologic symptoms since previous evaluation.  No issues with speech, gait, motor function, sensory impairment.  He denies seizures.  He does continues to have headaches "almost daily" and take Fioricet 2x per day, which has been his regimen for long time.   Medications: Current Outpatient Medications on File Prior to Visit  Medication Sig Dispense Refill  . albuterol (PROVENTIL HFA;VENTOLIN HFA) 108 (90 Base) MCG/ACT inhaler Inhale 2 puffs into the lungs every 6 (six) hours as needed for wheezing or shortness of breath.    . ALPRAZolam (XANAX) 1 MG tablet Take 1-2 mg at bedtime as needed by mouth for anxiety (sleep). Take 1 tablet (1 mg) by mouth daily at bedtime, may also take 1/2-1 tablet (0.5 - 1 mg) during the day as needed for anxiety     . aspirin EC 81 MG tablet Take 81 mg by mouth daily.    . budesonide-formoterol (SYMBICORT) 160-4.5 MCG/ACT inhaler Inhale 1 puff into the lungs 2 (two) times daily.     . butalbital-acetaminophen-caffeine (FIORICET, ESGIC) 50-325-40 MG tablet Take 2  tablets every 4 (four) hours as needed by mouth for headache.     . Cholecalciferol (D 1000) 1000 units capsule Take 1,000 Units by mouth daily.     Marland Kitchen escitalopram (LEXAPRO) 20 MG tablet Take 20 mg by mouth daily.    . finasteride (PROSCAR) 5 MG tablet Take 5 mg by mouth at bedtime.   3  . ipratropium-albuterol (DUONEB) 0.5-2.5 (3) MG/3ML SOLN Inhale 3 mLs into the lungs every 6 (six) hours as needed (shortness of breath/wheezing).     Marland Kitchen lisinopril (PRINIVIL,ZESTRIL) 10 MG tablet Take 10 mg by mouth at bedtime.    . OXYGEN Inhale 3-4 L into the lungs continuous.     . pantoprazole (PROTONIX) 40 MG tablet Take 40 mg by mouth daily. Received from [facility]    . pravastatin (PRAVACHOL) 20 MG tablet Take 1 tablet (20 mg total) by mouth at bedtime. Received from [facility] 90 tablet 3  . tamsulosin (FLOMAX) 0.4 MG CAPS capsule Take 0.4 mg by mouth daily. Received from [facility]     . Tiotropium Bromide Monohydrate (SPIRIVA RESPIMAT) 2.5 MCG/ACT AERS Inhale 1 puff into the lungs daily.    . Aspirin-Salicylamide-Caffeine (BC HEADACHE) 325-95-16 MG TABS Take 1 packet by mouth once as needed (FOR PAIN).    Marland Kitchen colchicine 0.6 MG tablet Take 0.6 mg by mouth every 6 (six) hours as needed (gout flares). Mitigare     No current facility-administered medications on file prior to visit.     Allergies:  Allergies  Allergen Reactions  .  Ativan [Lorazepam] Other (See Comments)    hallucinations   Past Medical History:  Past Medical History:  Diagnosis Date  . AAA (abdominal aortic aneurysm) ([place])    infrarenal AAA  . Aortic aneurysm ([place])   . Arthritis   . Benign prostatic hyperplasia   . COPD (chronic obstructive pulmonary disease) ([place])   . Depression   . Dyspnea    on exertion  . GERD (gastroesophageal reflux disease)   . Headache   . Hypertension   . Myocardial infarction ([place]) 1980's  . On home oxygen therapy    "2l; 24/7 when I'm home" (07/27/2016)  . Panlobular emphysema ([place])     . Pneumonia    9/18  . Prostate disorder    Past Surgical History:  Past Surgical History:  Procedure Laterality Date  . ABDOMINAL AORTIC ENDOVASCULAR STENT GRAFT N/A 07/27/2016   Procedure: ABDOMINAL AORTIC ENDOVASCULAR STENT GRAFT;  Surgeon: Angelia Mould, MD;  Location: [facility];  Service: Vascular;  Laterality: N/A;  . BRAIN SURGERY  Jul 14, 2013   Mass front Left side  . CARDIAC CATHETERIZATION  1980's  . ENDOSCOPIC TRANS NASAL APPROACH WITH FUSION N/A 01/29/2017   Procedure: ENDOSCOPIC TRANS NASAL APPROACH AND TRANS ORAL APPROACH WITH EXCISION PAPALOM;  Surgeon: Melida Quitter, MD;  Location: [facility];  Service: ENT;  Laterality: N/A;  . LAPAROTOMY  1970's   ulcer ruptured   Social History:  Social History   Socioeconomic History  . Marital status: Significant Other    Spouse name: Not on file  . Number of children: Not on file  . Years of education: Not on file  . Highest education level: Not on file  Occupational History  . Not on file  Social Needs  . Financial resource strain: Not on file  . Food insecurity:    Worry: Not on file    Inability: Not on file  . Transportation needs:    Medical: Not on file    Non-medical: Not on file  Tobacco Use  . Smoking status: Current Every Day Smoker    Packs/day: 1.00    Years: 40.00    Pack years: 40.00    Types: Cigarettes  . Smokeless tobacco: Former Systems developer    Types: Snuff, Chew  . Tobacco comment: states stopped smoking 4 days ago  Substance and Sexual Activity  . Alcohol use: No    Alcohol/week: 0.0 standard drinks  . Drug use: No  . Sexual activity: Not on file  Lifestyle  . Physical activity:    Days per week: Not on file    Minutes per session: Not on file  . Stress: Not on file  Relationships  . Social connections:    Talks on phone: Not on file    Gets together: Not on file    Attends religious service: Not on file    Active member of club or organization: Not on file    Attends meetings of clubs  or organizations: Not on file    Relationship status: Not on file  . Intimate partner violence:    Fear of current or ex partner: Not on file    Emotionally abused: Not on file    Physically abused: Not on file    Forced sexual activity: Not on file  Other Topics Concern  . Not on file  Social History Narrative   Worked in plumbing, Firefighter, carpenting.   Family History:  Family History  Problem Relation Age of Onset  .  COPD Sister     Review of Systems: Constitutional: Denies fevers, chills or abnormal weight loss Eyes: Denies blurriness of vision Ears, nose, mouth, throat, and face: Denies mucositis or sore throat Respiratory: +dyspnea Cardiovascular: Denies palpitation, chest discomfort or lower extremity swelling Gastrointestinal:  Denies nausea, constipation, diarrhea GU: Denies dysuria or incontinence Skin: Denies abnormal skin rashes Neurological: Per HPI Musculoskeletal: +joint pain Behavioral/Psych: Denies anxiety, disturbance in thought content, and mood instability  Physical Exam: Vitals:   12/10/17 1157  BP: 123/84  Pulse: 90  Resp: 18  Temp: 98.3 F (36.8 C)  SpO2: 95%   KPS: 90. General: Alert, cooperative, pleasant, in no acute distress.  On supplemental O2 Head: Craniotomy scar noted, dry and intact. EENT: No conjunctival injection or scleral icterus. Oral mucosa moist Lungs: Resp effort normal Cardiac: Regular rate and rhythm Abdomen: Soft, non-distended abdomen Skin: No rashes cyanosis or petechiae. Extremities: No clubbing or edema  Neurologic Exam: Mental Status: Awake, alert, attentive to examiner. Oriented to self and environment. Language is fluent with intact comprehension.  Cranial Nerves: Visual acuity is grossly normal. Visual fields are full. Extra-ocular movements intact. No ptosis. Face is symmetric, tongue midline. Motor: Tone and bulk are normal. Power is full in both arms and legs. Reflexes are symmetric, no pathologic  reflexes present. Intact finger to nose bilaterally Sensory: Intact to light touch and temperature Gait: Normal and tandem gait is normal.   Labs: I have reviewed the data as listed    Component Value Date/Time   NA 138 08/22/2017 0619   NA 141 12/07/2016 1230   K 4.9 08/22/2017 0619   CL 95 (L) 08/22/2017 0619   CO2 34 (H) 08/22/2017 0619   GLUCOSE 154 (H) 08/22/2017 0619   BUN 36 (H) 08/22/2017 0619   BUN 18 12/07/2016 1230   CREATININE 0.74 08/22/2017 0619   CALCIUM 8.9 08/22/2017 0619   PROT 6.9 08/17/2017 1649   PROT 6.5 12/07/2016 1230   ALBUMIN 3.6 08/17/2017 1649   ALBUMIN 4.1 12/07/2016 1230   AST 11 (L) 08/17/2017 1649   ALT 11 (L) 08/17/2017 1649   ALKPHOS 65 08/17/2017 1649   BILITOT 0.9 08/17/2017 1649   BILITOT 0.2 12/07/2016 1230   GFRNONAA >60 08/22/2017 0619   GFRAA >60 08/22/2017 0619   Lab Results  Component Value Date   WBC 10.6 (H) 08/22/2017   NEUTROABS 15.7 (H) 08/17/2017   HGB 13.5 08/22/2017   HCT 41.1 08/22/2017   MCV 90.3 08/22/2017   PLT 344 08/22/2017    Imaging: [facility] Clinician Interpretation: I have personally reviewed the CNS images as listed.  My interpretation, in the context of the patient's clinical presentation, is stable disease  Mr Jeri Cos Wo Contrast  Result Date: 12/06/2017 CLINICAL DATA:  History of meningioma status post resection. Creatinine was obtained on site at [facility] at [address] Results: Creatinine 0.8 mg/dL. EXAM: MRI HEAD WITHOUT AND WITH CONTRAST TECHNIQUE: Multiplanar, multiecho pulse sequences of the brain and surrounding structures were obtained without and with intravenous contrast. CONTRAST:  74mL MULTIHANCE GADOBENATE DIMEGLUMINE 529 MG/ML IV SOLN COMPARISON:  Brain MRI report 02/17/2014 from [facility]: Brain: Sequelae of bifrontal vertex craniotomy are identified. Minimal dural thickening subjacent to the craniotomy is likely postoperative. There is irregularity of the  anterior aspect of the superior sagittal sinus deep to the craniotomy with partial loss of the normal flow void which may be postsurgical and/or reflective of chronic thrombus. The prior outside MRI report described  a small amount of soft tissue projecting into the sinus. The sinuses widely patent more posteriorly. No recurrent enhancing mass lesion is identified. There is no evidence of acute infarct, intracranial hemorrhage, midline shift, or extra-axial fluid collection. There is mild cerebral atrophy. Patchy cerebral white matter T2 hyperintensities are nonspecific but compatible with mild chronic small vessel ischemic disease. Vascular: Major intracranial vascular flow voids are preserved. Skull and upper cervical spine: Craniotomy changes. No suspicious marrow lesion. Sinuses/Orbits: Unremarkable orbits. Clear paranasal sinuses. Moderate right and moderately large left mastoid effusions. Other: None. IMPRESSION: 1. Postsurgical changes from meningioma resection as above. No evidence of recurrent mass. 2. No acute intracranial abnormality. 3. Mild chronic small vessel ischemic disease. Electronically Signed   By: Logan Bores M.D.   On: 12/06/2017 14:38    Pathology:  Assessment/Plan 1. Meningioma [facility])  Mr. Gorby is clinically and radiographically stable today.  We recommend he return to clinic in 1 year with an MRI for evaluation, or sooner if clinically needed.  For headaches, discussed issues related to medication overuse with Fioricet.  He will begin keeping a headache and analgesia calendar and follow up with [place] as needed for further headaches management.  We appreciate the opportunity to participate in the care of Kiernan Atkerson.   We spent twenty additional minutes teaching regarding the natural history, biology, and historical experience in the treatment of brain tumors. We then discussed in detail the current recommendations for therapy focusing on the mode of administration, mechanism of  action, anticipated toxicities, and quality of life issues associated with this plan. We also provided teaching sheets for the patient to take home as an additional resource.  All questions were answered. The patient knows to call the clinic with any problems, questions or concerns. No barriers to learning were detected.  The total time spent in the encounter was 45 minutes and more than 50% was on counseling and review of test results   Ventura Sellers, MD Medical Director of Neuro-Oncology [facility] [place] 12/10/17 4:23 PM

## 2017-12-12 NOTE — Progress Notes (Signed)
Brain and Spine Tumor Board Documentation  Jay Mullins was presented by Cecil Cobbs, MD at Brain and Spine Tumor Board on 12/12/2017, which included representatives from neuro oncology, radiation oncology, surgical oncology, navigation, pathology, radiology.  Jay Mullins was presented as a new patient with history of the following treatments:  .  Additionally, we reviewed previous medical and familial history, history of present illness, and recent lab results along with all available histopathologic and imaging studies. The tumor board considered available treatment options and made the following recommendations:  Active surveillance    Tumor board is a meeting of clinicians from various specialty areas who evaluate and discuss patients for whom a multidisciplinary approach is being considered. Final determinations in the plan of care are those of the provider(s). The responsibility for follow up of recommendations given during tumor board is that of the provider.   Today's extended care, comprehensive team conference, Jay Mullins was not present for the discussion and was not examined.

## 2017-12-30 IMAGING — NM NM MISC PROCEDURE
6 series · 36 of 36 positions shown · non-contrast
Comparison: none

[Series 1: rest · 6.51mm/px · 6 of 64 frames shown]
[frame 6/64]
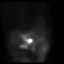
[frame 16/64]
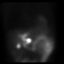
[frame 27/64]
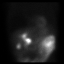
[frame 38/64]
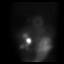
[frame 48/64]
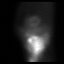
[frame 59/64]
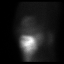

[Series 1: wbr_r-proj_st rest · 6.51mm/px · 6 of 64 frames shown]
[frame 6/64]
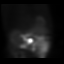
[frame 16/64]
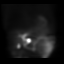
[frame 27/64]
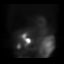
[frame 38/64]
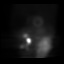
[frame 48/64]
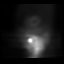
[frame 59/64]
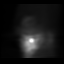

[Series 2: wbr_s-proj_st stress · 6.51mm/px · 6 of 64 frames shown (1 of 2)]
[frame 6/64]
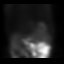
[frame 16/64]
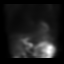
[frame 27/64]
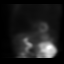
[frame 38/64]
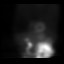
[frame 48/64]
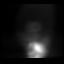
[frame 59/64]
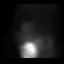

[Series 2: wbr_s-proj_st stress · 6.51mm/px · 6 of 512 frames shown (2 of 2)]
[frame 43/512]
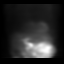
[frame 128/512]
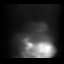
[frame 214/512]
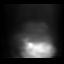
[frame 299/512]
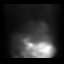
[frame 384/512]
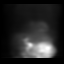
[frame 470/512]
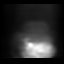

[Series 2: stress · 6.51mm/px · 6 of 64 frames shown (1 of 2)]
[frame 6/64]
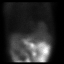
[frame 16/64]
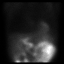
[frame 27/64]
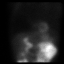
[frame 38/64]
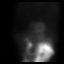
[frame 48/64]
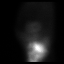
[frame 59/64]
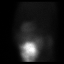

[Series 2: stress · 6.51mm/px · 6 of 512 frames shown (2 of 2)]
[frame 43/512]
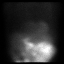
[frame 128/512]
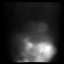
[frame 214/512]
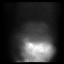
[frame 299/512]
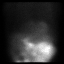
[frame 384/512]
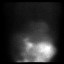
[frame 470/512]
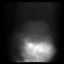

[36 of 36 positions shown; findings below may reference images not displayed]

Canned report from images found in remote index.

Refer to host system for actual result text.

## 2018-01-07 ENCOUNTER — Other Ambulatory Visit: Payer: Self-pay | Admitting: Internal Medicine

## 2018-01-17 ENCOUNTER — Other Ambulatory Visit: Payer: Self-pay | Admitting: *Deleted

## 2018-01-17 DIAGNOSIS — D329 Benign neoplasm of meninges, unspecified: Secondary | ICD-10-CM

## 2018-02-04 ENCOUNTER — Other Ambulatory Visit: Payer: Self-pay | Admitting: Internal Medicine

## 2018-02-06 ENCOUNTER — Other Ambulatory Visit (HOSPITAL_COMMUNITY): Payer: Self-pay | Admitting: Pulmonary Disease

## 2018-02-06 DIAGNOSIS — R911 Solitary pulmonary nodule: Secondary | ICD-10-CM

## 2018-02-15 ENCOUNTER — Ambulatory Visit (HOSPITAL_COMMUNITY): Payer: Medicare Other

## 2018-02-19 ENCOUNTER — Other Ambulatory Visit: Payer: Self-pay | Admitting: Internal Medicine

## 2018-02-20 ENCOUNTER — Other Ambulatory Visit: Payer: Self-pay | Admitting: Internal Medicine

## 2018-02-25 ENCOUNTER — Other Ambulatory Visit: Payer: Self-pay

## 2018-02-25 ENCOUNTER — Other Ambulatory Visit: Payer: Self-pay | Admitting: Internal Medicine

## 2018-02-25 MED ORDER — PRAVASTATIN SODIUM 20 MG PO TABS: 20.0000 mg | ORAL_TABLET | Freq: Every day | ORAL | 0 refills | Status: DC

## 2018-02-25 NOTE — Telephone Encounter (Signed)
New Message          *STAT* If patient is at the pharmacy, call can be transferred to refill team.   1. Which medications need to be refilled? (please list name of each medication and dose if known) pravastatin (PRAVACHOL) 20 MG tablet  2. Which pharmacy/location (including street and city if local pharmacy) is medication to be sent to? [facility] [place]  3. Do they need a 30 day or 90 day supply? [place]

## 2018-02-25 NOTE — Telephone Encounter (Signed)
Pt's medication was sent to pt's pharmacy as requested. Confirmation received.  °

## 2018-02-26 ENCOUNTER — Ambulatory Visit (HOSPITAL_COMMUNITY): Payer: Medicare Other

## 2018-03-07 ENCOUNTER — Ambulatory Visit (HOSPITAL_COMMUNITY)
Admission: RE | Admit: 2018-03-07 | Discharge: 2018-03-07 | Disposition: A | Discharge status: Home / Self Care | Payer: Medicare Other | Source: Ambulatory Visit | Attending: Pulmonary Disease | Admitting: Pulmonary Disease

## 2018-03-07 DIAGNOSIS — R911 Solitary pulmonary nodule: Secondary | ICD-10-CM | POA: Insufficient documentation

## 2018-05-29 ENCOUNTER — Telehealth: Payer: Self-pay | Admitting: Internal Medicine

## 2018-05-29 NOTE — Telephone Encounter (Signed)
Patient is OK Due to corona virus pandemic postponing nonemergent visits  Would reschedule to June/July when conditions improve

## 2018-05-30 ENCOUNTER — Other Ambulatory Visit: Payer: Self-pay | Admitting: Internal Medicine

## 2018-05-30 NOTE — Telephone Encounter (Signed)
Cancelled upcoming appointment, will send to C19 r/s pool to plan follow up 

## 2018-06-03 ENCOUNTER — Ambulatory Visit: Payer: Medicare Other | Admitting: Internal Medicine

## 2018-06-13 ENCOUNTER — Telehealth: Payer: Self-pay

## 2018-06-13 NOTE — Telephone Encounter (Signed)
Pts March appt canceled d/t COVID.  Rescheduled for Aug 17th OV with Dr. Harrington Challenger.

## 2018-08-31 ENCOUNTER — Other Ambulatory Visit: Payer: Self-pay | Admitting: Internal Medicine

## 2018-09-06 ENCOUNTER — Other Ambulatory Visit: Payer: Self-pay | Admitting: Internal Medicine

## 2018-09-25 ENCOUNTER — Other Ambulatory Visit: Payer: Self-pay

## 2018-09-25 ENCOUNTER — Other Ambulatory Visit (HOSPITAL_COMMUNITY): Payer: Medicare Other

## 2018-09-25 ENCOUNTER — Ambulatory Visit: Payer: Medicare Other | Admitting: Vascular Surgery

## 2018-09-25 DIAGNOSIS — I714 Abdominal aortic aneurysm, without rupture, unspecified: Secondary | ICD-10-CM

## 2018-10-01 ENCOUNTER — Telehealth (HOSPITAL_COMMUNITY): Payer: Self-pay | Admitting: *Deleted

## 2018-10-01 NOTE — Telephone Encounter (Signed)
The above patient or their representative was contacted and gave the following answers to these questions:         Do you have any of the following symptoms?n  Fever                    Cough                   Shortness of breath  Do  you have any of the following other symptoms? n   muscle pain         vomiting,        diarrhea        rash         weakness        red eye        abdominal pain         bruising          bruising or bleeding              joint pain           severe headache    Have you been in contact with someone who was or has been sick in the past 2 weeks?n  Yes                 Unsure                         Unable to assess   Does the person that you were in contact with have any of the following symptoms?   Cough         shortness of breath           muscle pain         vomiting,            diarrhea            rash            weakness           fever            red eye           abdominal pain           bruising  or  bleeding                joint pain                severe headache               Have you  or someone you have been in contact with traveled internationally in th last month?   n      If yes, which countries?   Have you  or someone you have been in contact with traveled outside  in th last month?   n      If yes, which state and city?   COMMENTS OR ACTION PLAN FOR THIS PATIENT:         

## 2018-10-02 ENCOUNTER — Ambulatory Visit: Payer: Medicare Other | Admitting: Vascular Surgery

## 2018-10-02 ENCOUNTER — Ambulatory Visit (HOSPITAL_COMMUNITY): Payer: Medicare Other

## 2018-10-20 NOTE — Progress Notes (Signed)
Cardiology Office Note   Date:  10/21/2018   ID:  Jay Mullins, DOB [birth year], MRN [id number]  PCP:  Chesley Noon, MD  Cardiologist:   Dorris Carnes, MD   F/U of HTN  And CAD     History of Present Illness: Jay Mullins is a 68 y.o. male with a history of HTN, HL, COPD, AAA (s/p AAA repair) and bilateral iliac aneurysms    Pt reported history of mild heart attack 30 years prior Rx with PTCA  Lexiscan myovue in May 2018  showed a large inferior defect consistent with soft tissue attenuation  Mild ischemia at basal septum  LVEF 53%  I last saw him in cardiology in October 2018  Since seen he denies CP  Activityis very limited by COPD    ON 4 L O2        Current Meds  Medication Sig   albuterol (PROVENTIL HFA;VENTOLIN HFA) 108 (90 Base) MCG/ACT inhaler Inhale 2 puffs into the lungs every 6 (six) hours as needed for wheezing or shortness of breath.   ALPRAZolam (XANAX) 1 MG tablet Take 1-2 mg at bedtime as needed by mouth for anxiety (sleep). Take 1 tablet (1 mg) by mouth daily at bedtime, may also take 1/2-1 tablet (0.5 - 1 mg) during the day as needed for anxiety    aspirin EC 81 MG tablet Take 81 mg by mouth daily.   Aspirin-Salicylamide-Caffeine (BC HEADACHE) 325-95-16 MG TABS Take 1 packet by mouth once as needed (FOR PAIN).   budesonide-formoterol (SYMBICORT) 160-4.5 MCG/ACT inhaler Inhale 1 puff into the lungs 2 (two) times daily.    butalbital-acetaminophen-caffeine (FIORICET, ESGIC) 50-325-40 MG tablet Take 2 tablets every 4 (four) hours as needed by mouth for headache.    Cholecalciferol (D 1000) 1000 units capsule Take 1,000 Units by mouth daily.    colchicine 0.6 MG tablet Take 0.6 mg by mouth every 6 (six) hours as needed (gout flares). Mitigare   escitalopram (LEXAPRO) 20 MG tablet Take 20 mg by mouth daily.   finasteride (PROSCAR) 5 MG tablet Take 5 mg by mouth at bedtime.    ipratropium-albuterol (DUONEB) 0.5-2.5 (3) MG/3ML SOLN Inhale 3 mLs into the  lungs every 6 (six) hours as needed (shortness of breath/wheezing).    lisinopril (PRINIVIL,ZESTRIL) 10 MG tablet Take 10 mg by mouth at bedtime.   OXYGEN Inhale 3-4 L into the lungs continuous.    pantoprazole (PROTONIX) 40 MG tablet Take 40 mg by mouth daily. Received from [facility]   pravastatin (PRAVACHOL) 20 MG tablet TAKE 1 TABLET BY MOUTH AT BEDTIME   tamsulosin (FLOMAX) 0.4 MG CAPS capsule Take 0.4 mg by mouth daily. Received from [facility]    Tiotropium Bromide Monohydrate (SPIRIVA RESPIMAT) 2.5 MCG/ACT AERS Inhale 1 puff into the lungs daily.     Allergies:   Lorazepam   Past Medical History:  Diagnosis Date   AAA (abdominal aortic aneurysm) (HCC)    infrarenal AAA   Aortic aneurysm (HCC)    Arthritis    Benign prostatic hyperplasia    COPD (chronic obstructive pulmonary disease) (HCC)    Depression    Dyspnea    on exertion   GERD (gastroesophageal reflux disease)    Headache    Hypertension    Myocardial infarction ([place]) 1980's   On home oxygen therapy    "2l; 24/7 when I'm home" (07/27/2016)   Panlobular emphysema ([place])    Pneumonia    9/18   Prostate disorder  Past Surgical History:  Procedure Laterality Date   ABDOMINAL AORTIC ENDOVASCULAR STENT GRAFT N/A 07/27/2016   Procedure: ABDOMINAL AORTIC ENDOVASCULAR STENT GRAFT;  Surgeon: Angelia Mould, MD;  Location: [place];  Service: Vascular;  Laterality: N/A;   BRAIN SURGERY  Jul 14, 2013   Mass front Left side   CARDIAC CATHETERIZATION  1980's   ENDOSCOPIC TRANS NASAL APPROACH WITH FUSION N/A 01/29/2017   Procedure: ENDOSCOPIC TRANS NASAL APPROACH AND TRANS ORAL APPROACH WITH EXCISION PAPALOM;  Surgeon: Melida Quitter, MD;  Location: [place];  Service: ENT;  Laterality: N/A;   LAPAROTOMY  1970's   ulcer ruptured     Social History:  The patient  reports that he has been smoking cigarettes. He has a 40.00 pack-year smoking history. He has quit using smokeless tobacco.   His smokeless tobacco use included snuff and chew. He reports that he does not drink alcohol or use drugs.   Family History:  The patient's family history includes COPD in his sister.    ROS:  Please see the history of present illness. All other systems are reviewed and  Negative to the above problem except as noted.    PHYSICAL EXAM: VS:  BP (!) 144/74    Pulse 65    Ht 5\' 9"  (1.753 m)    Wt 138 lb (62.6 kg)    SpO2 99%    BMI 20.38 kg/m   GEN: Thin 68 yo in no acute distress    On oxygen  In wheelchair HEENT: normal  Neck: no JVD, carotid bruits, or masses Cardiac: RRR; no murmurs, rubs, or gallops,no edema  Respiratory:  Decreased airflow  No wheezes  GI: soft, nontender, nondistended, + BS  No hepatomegaly  MS: no deformity Moving all extremities   Skin: warm and dry, no rash Neuro:  Strength and sensation are intact Psych: euthymic mood, full affect   EKG:  EKG is not ordered today.   Lipid Panel    Component Value Date/Time   CHOL 142 12/07/2016 1230   TRIG 68 12/07/2016 1230   HDL 62 12/07/2016 1230   CHOLHDL 2.3 12/07/2016 1230   LDLCALC 66 12/07/2016 1230      Wt Readings from Last 3 Encounters:  10/21/18 138 lb (62.6 kg)  12/10/17 133 lb 11.2 oz (60.6 kg)  08/21/17 121 lb 4.1 oz (55 kg)      ASSESSMENT AND PLAN:  1  CAD  No symptoms to sugg angina   Keep on same meds    2  HTN  BP is OK    3  HL  Will check lipids today   4  COPD  Significant   On 4L O2   Follows in pulmonary    5  PVOD  S/p AAA repair   Last CT of chest prox aorta wsa 40 mm   Follow   Follow up in May 2021   Current medicines are reviewed at length with the patient today.  The patient does not have concerns regarding medicines.  Signed, Dorris Carnes, MD  10/21/2018 10:11 AM    [facility] [address], [place], [place]  [address] Phone: [phone]; Fax: [phone]

## 2018-10-21 ENCOUNTER — Ambulatory Visit (INDEPENDENT_AMBULATORY_CARE_PROVIDER_SITE_OTHER): Payer: Medicare Other | Admitting: Internal Medicine

## 2018-10-21 ENCOUNTER — Encounter (INDEPENDENT_AMBULATORY_CARE_PROVIDER_SITE_OTHER): Payer: Self-pay

## 2018-10-21 ENCOUNTER — Encounter: Payer: Self-pay | Admitting: Internal Medicine

## 2018-10-21 ENCOUNTER — Other Ambulatory Visit: Payer: Self-pay

## 2018-10-21 VITALS — BP 144/74 | HR 65 | Ht 69.0 in | Wt 138.0 lb

## 2018-10-21 DIAGNOSIS — I2510 Atherosclerotic heart disease of native coronary artery without angina pectoris: Secondary | ICD-10-CM

## 2018-10-21 DIAGNOSIS — E782 Mixed hyperlipidemia: Secondary | ICD-10-CM

## 2018-10-21 LAB — LIPID PANEL
Chol/HDL Ratio: 2.2 ratio (ref 0.0–5.0)
Cholesterol, Total: 149 mg/dL (ref 100–199)
HDL: 68 mg/dL (ref >39)
LDL Calculated: 60 mg/dL (ref 0–99)
Triglycerides: 106 mg/dL (ref 0–149)
VLDL Cholesterol Cal: 21 mg/dL (ref 5–40)

## 2018-10-21 LAB — BASIC METABOLIC PANEL
BUN/Creatinine Ratio: 20 (ref 10–24)
BUN: 16 mg/dL (ref 8–27)
CO2: 27 mmol/L (ref 20–29)
Calcium: 9.4 mg/dL (ref 8.6–10.2)
Chloride: 99 mmol/L (ref 96–106)
Creatinine, Ser: 0.81 mg/dL (ref 0.76–1.27)
GFR calc Af Amer: 106 mL/min/{1.73_m2} (ref >59)
GFR calc non Af Amer: 92 mL/min/{1.73_m2} (ref >59)
Glucose: 107 mg/dL — ABNORMAL HIGH (ref 65–99)
Potassium: 4.5 mmol/L (ref 3.5–5.2)
Sodium: 141 mmol/L (ref 134–144)

## 2018-10-21 LAB — CBC
Hematocrit: 37.3 % — ABNORMAL LOW (ref 37.5–51.0)
Hemoglobin: 12.4 g/dL — ABNORMAL LOW (ref 13.0–17.7)
MCH: 30.3 pg (ref 26.6–33.0)
MCHC: 33.2 g/dL (ref 31.5–35.7)
MCV: 91 fL (ref 79–97)
Platelets: 340 10*3/uL (ref 150–450)
RBC: 4.09 x10E6/uL — ABNORMAL LOW (ref 4.14–5.80)
RDW: 13 % (ref 11.6–15.4)
WBC: 6.6 10*3/uL (ref 3.4–10.8)

## 2018-10-21 NOTE — Patient Instructions (Signed)
Medication Instructions:  No changes If you need a refill on your cardiac medications before your next appointment, please call your pharmacy.   Lab work: Today: cbc, bmet, lipids If you have labs (blood work) drawn today and your tests are completely normal, you will receive your results only by: Marland Kitchen MyChart Message (if you have MyChart) OR . A paper copy in the mail If you have any lab test that is abnormal or we need to change your treatment, we will call you to review the results.  Testing/Procedures: none  Follow-Up: At [facility], you and your health needs are our priority.  As part of our continuing mission to provide you with exceptional heart care, we have created designated Provider Care Teams.  These Care Teams include your primary Cardiologist (physician) and Advanced Practice Providers (APPs -  Physician Assistants and Nurse Practitioners) who all work together to provide you with the care you need, when you need it. You will need a follow up appointment in:  9 months.  Please call our office 2 months in advance to schedule this appointment.  You may see Dr. Dorris Carnes or one of the following Advanced Practice Providers on your designated Care Team: Richardson Dopp, PA-C [place], [place] . Daune Perch, NP  Any Other Special Instructions Will Be Listed Below (If Applicable).

## 2018-11-05 ENCOUNTER — Telehealth (HOSPITAL_COMMUNITY): Payer: Self-pay | Admitting: Rehabilitation

## 2018-11-05 NOTE — Telephone Encounter (Signed)

## 2018-11-06 ENCOUNTER — Encounter: Payer: Self-pay | Admitting: Vascular Surgery

## 2018-11-06 ENCOUNTER — Ambulatory Visit (INDEPENDENT_AMBULATORY_CARE_PROVIDER_SITE_OTHER): Payer: Medicare Other | Admitting: Vascular Surgery

## 2018-11-06 ENCOUNTER — Ambulatory Visit (HOSPITAL_COMMUNITY)
Admission: RE | Admit: 2018-11-06 | Discharge: 2018-11-06 | Disposition: A | Discharge status: Home / Self Care | Payer: Medicare Other | Source: Ambulatory Visit | Attending: Vascular Surgery | Admitting: Vascular Surgery

## 2018-11-06 ENCOUNTER — Other Ambulatory Visit: Payer: Self-pay

## 2018-11-06 VITALS — BP 125/80 | HR 77 | Temp 97.5°F | Resp 24 | Ht 69.0 in | Wt 134.0 lb

## 2018-11-06 DIAGNOSIS — I714 Abdominal aortic aneurysm, without rupture, unspecified: Secondary | ICD-10-CM

## 2018-11-06 NOTE — Progress Notes (Signed)
Patient name: Jay Mullins MRN: [id number] DOB: 10/18/1950 Sex: male  REASON FOR VISIT:   Follow-up after endovascular aneurysm repair.  HPI:   Jay Mullins is a pleasant 68 y.o. male who underwent percutaneous endovascular repair of a 5.6 cm infrarenal abdominal aortic aneurysm in May 2018.  He comes in for yearly follow-up visit.  Since I saw him last, he denies any abdominal pain or back pain.  His activity is limited by his shortness of breath.  He is on home O2 secondary to his COPD.  I do not get any history of claudication although again I think his activity is limited by his shortness of breath.  Past Medical History:  Diagnosis Date  . AAA (abdominal aortic aneurysm) ([place])    infrarenal AAA  . Aortic aneurysm ([place])   . Arthritis   . Benign prostatic hyperplasia   . COPD (chronic obstructive pulmonary disease) ([place])   . Depression   . Dyspnea    on exertion  . GERD (gastroesophageal reflux disease)   . Headache   . Hypertension   . Myocardial infarction ([place]) 1980's  . On home oxygen therapy    "2l; 24/7 when I'm home" (07/27/2016)  . Panlobular emphysema ([place])   . Pneumonia    9/18  . Prostate disorder     Family History  Problem Relation Age of Onset  . COPD Sister     SOCIAL HISTORY: Social History   Tobacco Use  . Smoking status: Former Smoker    Packs/day: 1.00    Years: 40.00    Pack years: 40.00    Types: Cigarettes    Quit date: 2018    Years since quitting: 2.6  . Smokeless tobacco: Former Systems developer    Types: Snuff, Chew  Substance Use Topics  . Alcohol use: No    Alcohol/week: 0.0 standard drinks    Allergies  Allergen Reactions  . Lorazepam Other (See Comments)    hallucinations hallucinations hallucinations    Current Outpatient Medications  Medication Sig Dispense Refill  . albuterol (PROVENTIL HFA;VENTOLIN HFA) 108 (90 Base) MCG/ACT inhaler Inhale 2 puffs into the lungs every 6 (six) hours as needed for wheezing or shortness of  breath.    . ALPRAZolam (XANAX) 1 MG tablet Take 1-2 mg at bedtime as needed by mouth for anxiety (sleep). Take 1 tablet (1 mg) by mouth daily at bedtime, may also take 1/2-1 tablet (0.5 - 1 mg) during the day as needed for anxiety     . aspirin EC 81 MG tablet Take 81 mg by mouth daily.    . Aspirin-Salicylamide-Caffeine (BC HEADACHE) 325-95-16 MG TABS Take 1 packet by mouth once as needed (FOR PAIN).    Marland Kitchen budesonide-formoterol (SYMBICORT) 160-4.5 MCG/ACT inhaler Inhale 1 puff into the lungs 2 (two) times daily.     . butalbital-acetaminophen-caffeine (FIORICET, ESGIC) 50-325-40 MG tablet Take 2 tablets every 4 (four) hours as needed by mouth for headache.     . Cholecalciferol (D 1000) 1000 units capsule Take 1,000 Units by mouth daily.     . colchicine 0.6 MG tablet Take 0.6 mg by mouth every 6 (six) hours as needed (gout flares). Mitigare    . escitalopram (LEXAPRO) 20 MG tablet Take 20 mg by mouth daily.    . finasteride (PROSCAR) 5 MG tablet Take 5 mg by mouth at bedtime.   3  . hydrOXYzine (ATARAX/VISTARIL) 25 MG tablet TAKE 1 TABLET(25 MG) BY MOUTH THREE TIMES DAILY FOR UP TO 10 DAYS  AS NEEDED FOR ITCHING    . ipratropium-albuterol (DUONEB) 0.5-2.5 (3) MG/3ML SOLN Inhale 3 mLs into the lungs every 6 (six) hours as needed (shortness of breath/wheezing).     Marland Kitchen lisinopril (PRINIVIL,ZESTRIL) 10 MG tablet Take 10 mg by mouth at bedtime.    . OXYGEN Inhale 3-4 L into the lungs continuous.     . pantoprazole (PROTONIX) 40 MG tablet Take 40 mg by mouth daily. [facility]    . pravastatin (PRAVACHOL) 20 MG tablet TAKE 1 TABLET BY MOUTH AT BEDTIME 60 tablet 0  . tamsulosin (FLOMAX) 0.4 MG CAPS capsule Take 0.4 mg by mouth daily. Received from [facility]     . Tiotropium Bromide Monohydrate (SPIRIVA RESPIMAT) 2.5 MCG/ACT AERS Inhale 1 puff into the lungs daily.     No current facility-administered medications for this visit.     REVIEW OF SYSTEMS:  [X]  denotes positive finding,  [ ]  denotes negative finding Cardiac  Comments:  Chest pain or chest pressure:    Shortness of breath upon exertion: x   Short of breath when lying flat:    Irregular heart rhythm:        Vascular    Pain in calf, thigh, or hip brought on by ambulation:    Pain in feet at night that wakes you up from your sleep:     Blood clot in your veins:    Leg swelling:         Pulmonary    Oxygen at home:    Productive cough:     Wheezing:         Neurologic    Sudden weakness in arms or legs:     Sudden numbness in arms or legs:     Sudden onset of difficulty speaking or slurred speech:    Temporary loss of vision in one eye:     Problems with dizziness:         Gastrointestinal    Blood in stool:     Vomited blood:         Genitourinary    Burning when urinating:     Blood in urine:        Psychiatric    Major depression:         Hematologic    Bleeding problems:    Problems with blood clotting too easily:        Skin    Rashes or ulcers:        Constitutional    Fever or chills:     PHYSICAL EXAM:   Vitals:   11/06/18 0955  BP: 125/80  Pulse: 77  Resp: (!) 24  Temp: (!) 97.5 F (36.4 C)  SpO2: 100%  Weight: 134 lb (60.8 kg)  Height: 5\' 9"  (1.753 m)    GENERAL: The patient is a well-nourished male, in no acute distress. The vital signs are documented above. CARDIAC: There is a regular rate and rhythm.  VASCULAR: I do not detect carotid bruits. He has palpable femoral pulses. Both feet are warm and well perfused. He has no significant lower extremity swelling. PULMONARY: There is good air exchange bilaterally without wheezing or rales. ABDOMEN: Soft and non-tender with normal pitched bowel sounds.  MUSCULOSKELETAL: There are no major deformities or cyanosis. NEUROLOGIC: No focal weakness or paresthesias are detected. SKIN: There are no ulcers or rashes noted. PSYCHIATRIC: The patient has a normal affect.  DATA:    DUPLEX ABDOMINAL AORTA: I have  independently interpreted  his duplex of his aneurysm.  The largest aortic measurement is 4.5 cm which is not changed compared to January 2019.  This is been a year and a half.  MEDICAL ISSUES:   STATUS POST ENDOVASCULAR ANEURYSM REPAIR: Patient is now 2 years out status post endovascular aneurysm repair.  His aneurysm has decreased in size from 5.6 cm to 4.5 cm.  I have ordered a follow-up duplex of his aneurysm in 1-1/2 years.  I will see him back that time.  He knows to call sooner if he has problems.  Fortunately he is not a smoker.  He is on aspirin and is on a statin.  Deitra Mayo [facility] [phone]

## 2018-11-12 ENCOUNTER — Other Ambulatory Visit: Payer: Self-pay | Admitting: Internal Medicine

## 2018-11-28 ENCOUNTER — Other Ambulatory Visit: Payer: Self-pay | Admitting: Radiation Therapy

## 2018-12-09 ENCOUNTER — Ambulatory Visit: Payer: Medicare Other | Admitting: Internal Medicine

## 2018-12-17 ENCOUNTER — Other Ambulatory Visit: Payer: Self-pay | Admitting: Internal Medicine

## 2018-12-19 ENCOUNTER — Other Ambulatory Visit: Payer: Self-pay

## 2018-12-19 ENCOUNTER — Ambulatory Visit
Admission: RE | Admit: 2018-12-19 | Discharge: 2018-12-19 | Disposition: A | Discharge status: Home / Self Care | Payer: Medicare Other | Source: Ambulatory Visit | Attending: Internal Medicine | Admitting: Internal Medicine

## 2018-12-19 ENCOUNTER — Other Ambulatory Visit: Payer: Self-pay | Admitting: *Deleted

## 2018-12-19 DIAGNOSIS — D329 Benign neoplasm of meninges, unspecified: Secondary | ICD-10-CM

## 2018-12-19 NOTE — Progress Notes (Signed)
Patient unable to tolerate MRI of Brain due to severe COPD.  They attempted several times to get patient comfortable with use of inhaler to attempt to tolerate MRI and was still unable.    Per Dr. Mickeal Skinner ok to try CT of Head.  Order placed.

## 2018-12-23 ENCOUNTER — Ambulatory Visit: Payer: Medicare Other | Admitting: Internal Medicine

## 2019-01-01 ENCOUNTER — Telehealth: Payer: Self-pay | Admitting: Oncology

## 2019-01-01 NOTE — Telephone Encounter (Signed)
Scheduled appt per 10/27 sch message - pt significant other is aware of appt added.

## 2019-01-20 ENCOUNTER — Inpatient Hospital Stay: Admission: RE | Admit: 2019-01-20 | Payer: Medicare Other | Source: Ambulatory Visit

## 2019-01-23 ENCOUNTER — Inpatient Hospital Stay: Payer: Medicare Other | Admitting: Internal Medicine

## 2019-02-14 IMAGING — DX DG CHEST 2V
3 series · 3 of 3 positions shown · non-contrast
Comparison: 08/20/2017

CLINICAL DATA: History of recent pneumonia with cough and shortness
of breath

EXAM:
CHEST - 2 VIEW

[chest pa (1 of 2)]
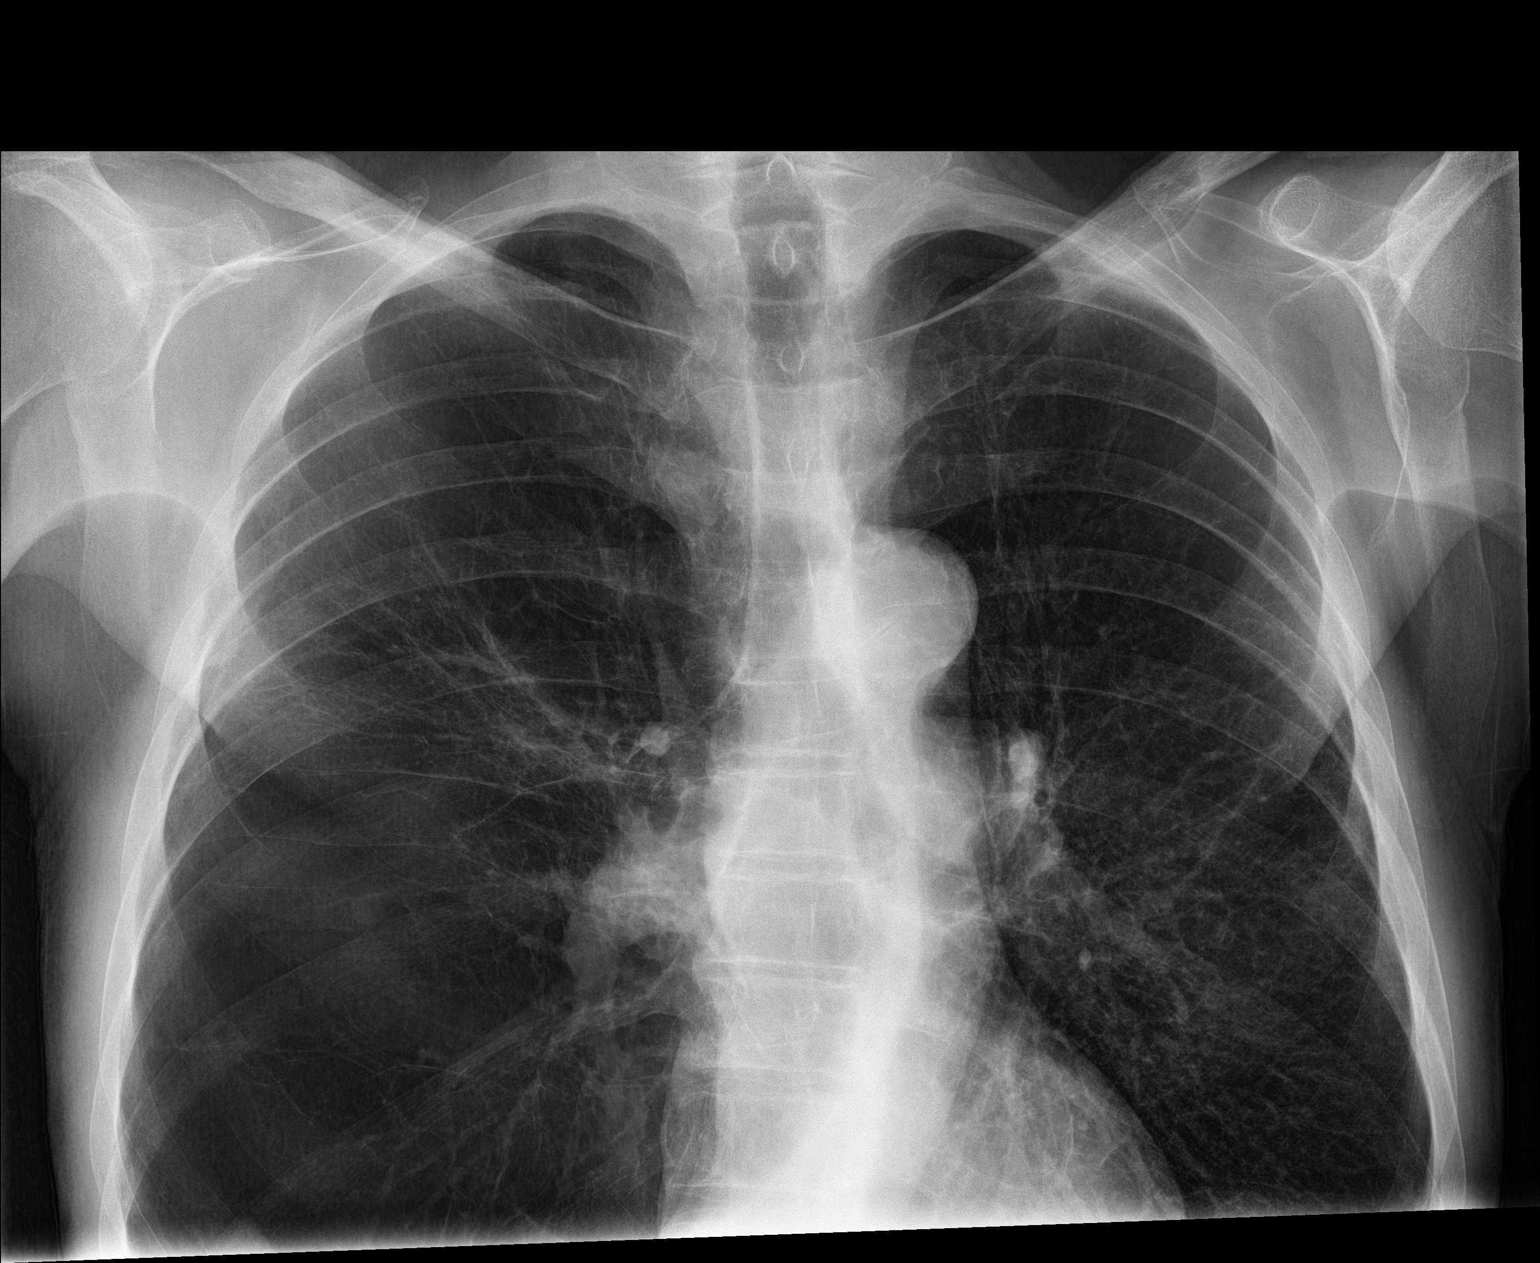

[chest lat]
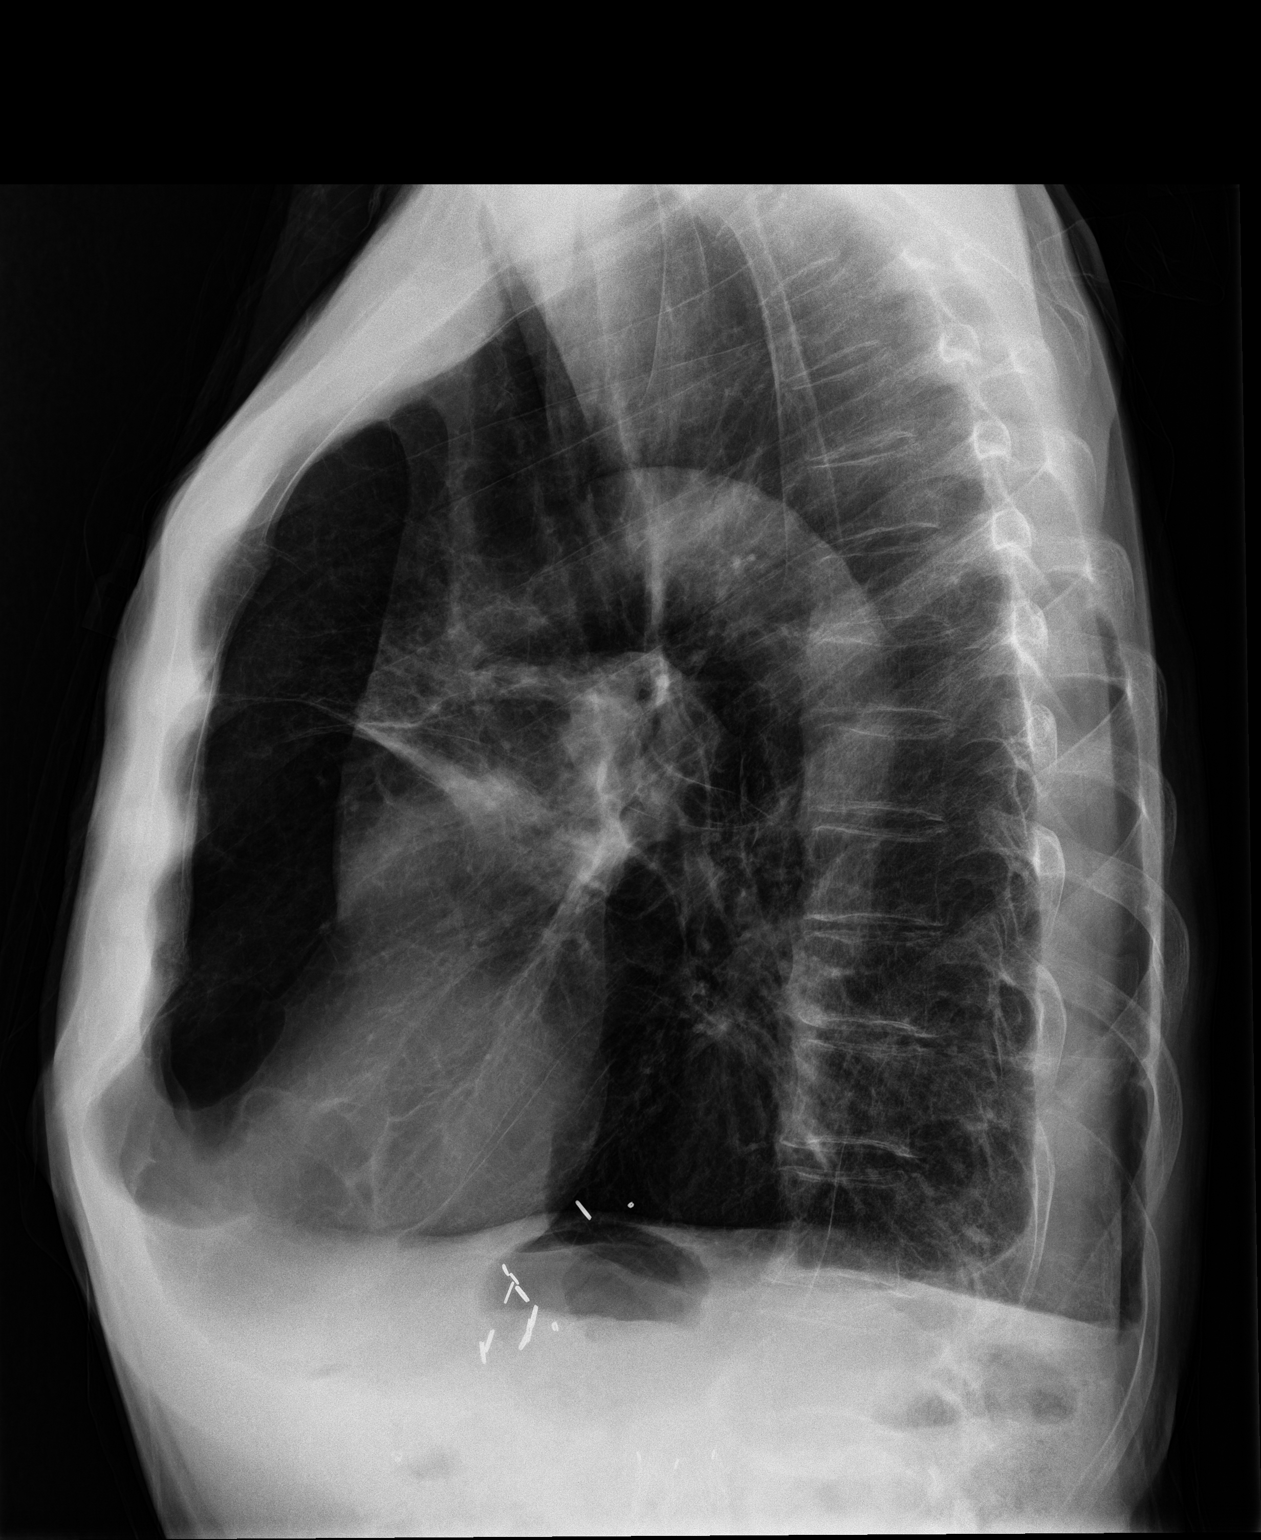

[chest pa (2 of 2)]
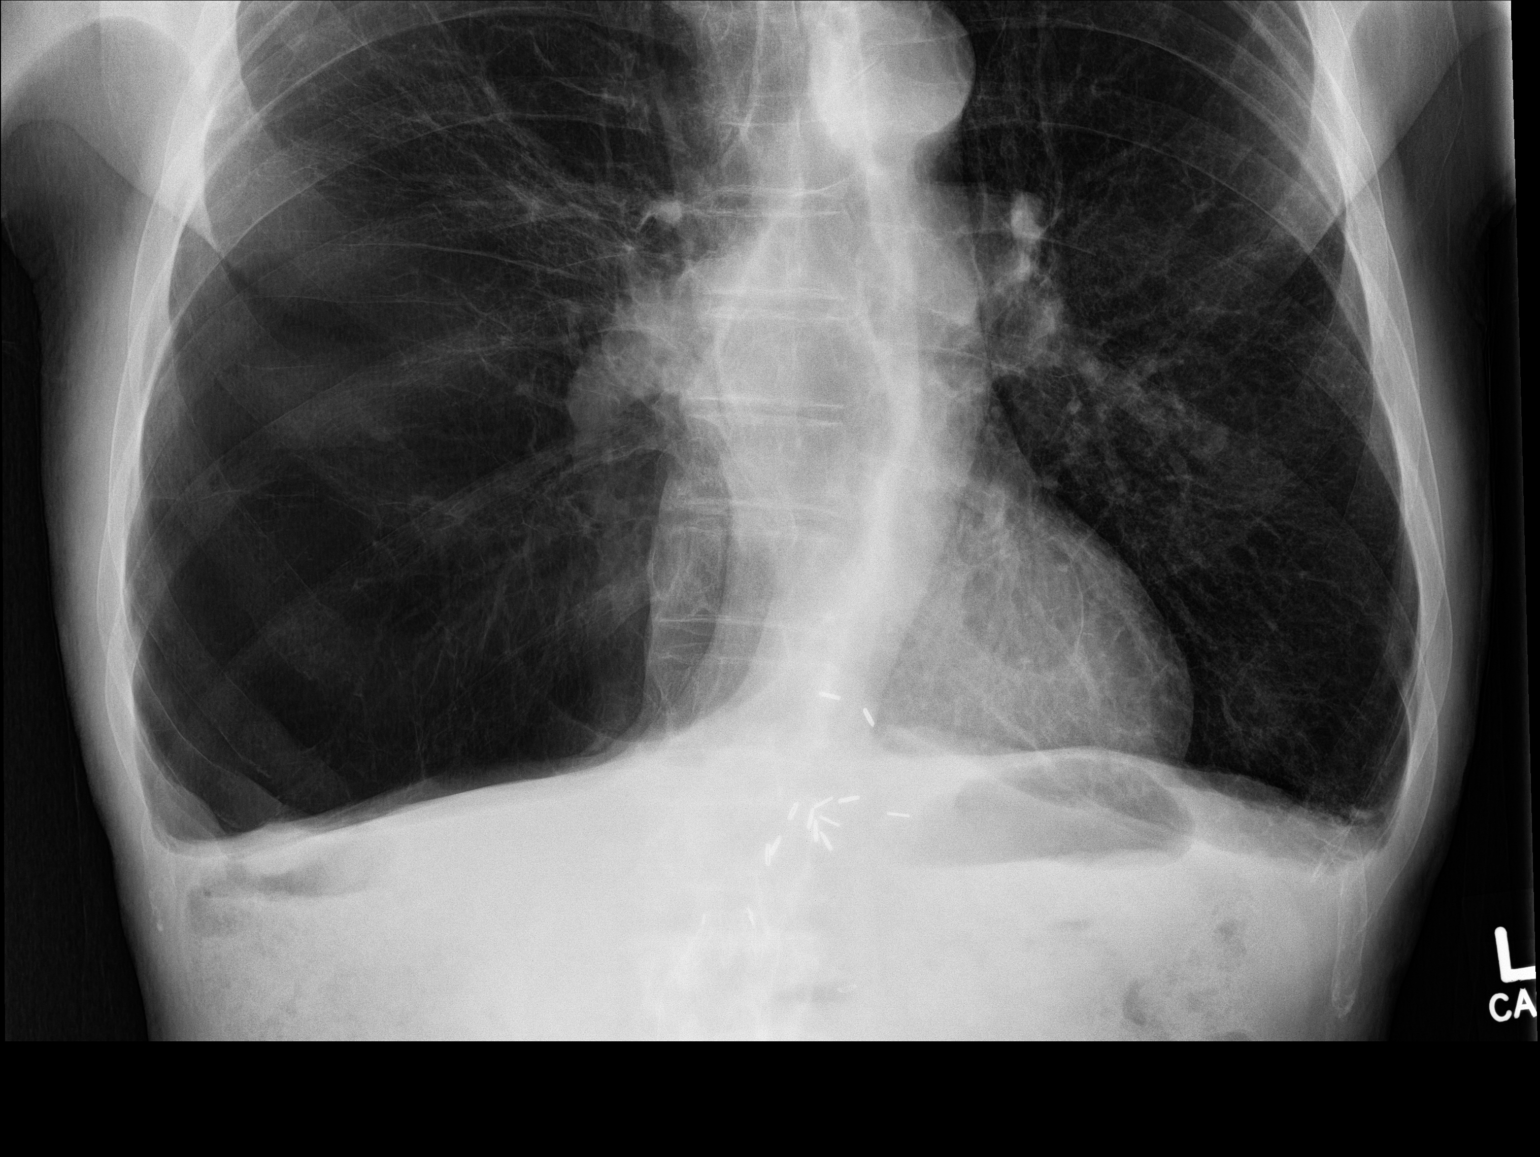

[3 of 3 positions shown; findings below may reference images not displayed]

FINDINGS: Lungs are again hyperinflated with bullous changes consistent with
COPD. No focal infiltrate or sizable effusion is noted. Scarring is
again seen in the right lung related to the bullous change. No other
focal abnormality is noted.
IMPRESSION: Bullous emphysematous change with some scarring in the right lung.
No other focal infiltrate is seen.

## 2019-05-20 ENCOUNTER — Telehealth (HOSPITAL_COMMUNITY): Payer: Self-pay

## 2019-05-20 ENCOUNTER — Other Ambulatory Visit: Payer: Self-pay | Admitting: *Deleted

## 2019-05-20 DIAGNOSIS — R209 Unspecified disturbances of skin sensation: Secondary | ICD-10-CM

## 2019-05-20 NOTE — Telephone Encounter (Signed)

## 2019-05-21 ENCOUNTER — Encounter: Payer: Self-pay | Admitting: Vascular Surgery

## 2019-05-21 ENCOUNTER — Ambulatory Visit (INDEPENDENT_AMBULATORY_CARE_PROVIDER_SITE_OTHER): Payer: Medicare Other | Admitting: Vascular Surgery

## 2019-05-21 ENCOUNTER — Other Ambulatory Visit: Payer: Self-pay

## 2019-05-21 ENCOUNTER — Ambulatory Visit (HOSPITAL_COMMUNITY)
Admission: RE | Admit: 2019-05-21 | Discharge: 2019-05-21 | Disposition: A | Discharge status: Home / Self Care | Payer: Medicare Other | Source: Ambulatory Visit | Attending: Vascular Surgery | Admitting: Vascular Surgery

## 2019-05-21 VITALS — BP 103/63 | HR 84 | Temp 98.2°F | Resp 20 | Ht 69.0 in | Wt 135.0 lb

## 2019-05-21 DIAGNOSIS — I714 Abdominal aortic aneurysm, without rupture, unspecified: Secondary | ICD-10-CM

## 2019-05-21 DIAGNOSIS — R209 Unspecified disturbances of skin sensation: Secondary | ICD-10-CM | POA: Diagnosis present

## 2019-05-21 NOTE — Progress Notes (Signed)
Patient name: Jay Mullins MRN: [id number] DOB: 1950/10/05 Sex: male  REASON FOR VISIT:   Cold feet  HPI:   Jay Mullins is a pleasant 69 y.o. male who was referred to our office because he has been having cold feet.  The patient is well-known to me.  He underwent endovascular aneurysm repair of a 5.6 cm infrarenal abdominal aortic aneurysm in May 2018.  When I saw him last on 11/06/2018 his duplex scan showed that the largest diameter of his aneurysm was 4.5 cm.  He denied any history of claudication although his activity is limited by his shortness of breath.  I stretched his follow-up out to a year and a half.  He was referred today because he had been complaining of cold feet.  I do not get any history of claudication however his activity is very limited by his shortness of breath.  He is on home O2.  He denies any history of rest pain or nonhealing ulcers.  He denies any abdominal pain or back pain.  Past Medical History:  Diagnosis Date  . AAA (abdominal aortic aneurysm) ([place])    infrarenal AAA  . Aortic aneurysm ([place])   . Arthritis   . Benign prostatic hyperplasia   . COPD (chronic obstructive pulmonary disease) ([place])   . Depression   . Dyspnea    on exertion  . GERD (gastroesophageal reflux disease)   . Headache   . Hypertension   . Myocardial infarction ([place]) 1980's  . On home oxygen therapy    "2l; 24/7 when I'm home" (07/27/2016)  . Panlobular emphysema ([place])   . Pneumonia    9/18  . Prostate disorder     Family History  Problem Relation Age of Onset  . COPD Sister     SOCIAL HISTORY: Social History   Tobacco Use  . Smoking status: Former Smoker    Packs/day: 1.00    Years: 40.00    Pack years: 40.00    Types: Cigarettes    Quit date: 2018    Years since quitting: 3.2  . Smokeless tobacco: Former Systems developer    Types: Snuff, Chew  Substance Use Topics  . Alcohol use: No    Alcohol/week: 0.0 standard drinks    Allergies  Allergen Reactions  . Lorazepam  Other (See Comments)    hallucinations hallucinations hallucinations    Current Outpatient Medications  Medication Sig Dispense Refill  . albuterol (PROVENTIL HFA;VENTOLIN HFA) 108 (90 Base) MCG/ACT inhaler Inhale 2 puffs into the lungs every 6 (six) hours as needed for wheezing or shortness of breath.    . ALPRAZolam (XANAX) 1 MG tablet Take 1-2 mg at bedtime as needed by mouth for anxiety (sleep). Take 1 tablet (1 mg) by mouth daily at bedtime, may also take 1/2-1 tablet (0.5 - 1 mg) during the day as needed for anxiety     . aspirin EC 81 MG tablet Take 81 mg by mouth daily.    . Aspirin-Salicylamide-Caffeine (BC HEADACHE) 325-95-16 MG TABS Take 1 packet by mouth once as needed (FOR PAIN).    Marland Kitchen budesonide-formoterol (SYMBICORT) 160-4.5 MCG/ACT inhaler Inhale 1 puff into the lungs 2 (two) times daily.     . butalbital-acetaminophen-caffeine (FIORICET, ESGIC) 50-325-40 MG tablet Take 2 tablets every 4 (four) hours as needed by mouth for headache.     . Cholecalciferol (D 1000) 1000 units capsule Take 1,000 Units by mouth daily.     . colchicine 0.6 MG tablet Take 0.6 mg by mouth  every 6 (six) hours as needed (gout flares). Mitigare    . escitalopram (LEXAPRO) 20 MG tablet Take 20 mg by mouth daily.    . finasteride (PROSCAR) 5 MG tablet Take 5 mg by mouth at bedtime.   3  . Fluticasone-Umeclidin-Vilant (TRELEGY ELLIPTA) 200-62.5-25 MCG/INH AEPB Inhale into the lungs.    . hydrOXYzine (ATARAX/VISTARIL) 25 MG tablet TAKE 1 TABLET(25 MG) BY MOUTH THREE TIMES DAILY FOR UP TO 10 DAYS AS NEEDED FOR ITCHING    . ipratropium-albuterol (DUONEB) 0.5-2.5 (3) MG/3ML SOLN Inhale 3 mLs into the lungs every 6 (six) hours as needed (shortness of breath/wheezing).     Marland Kitchen lisinopril (PRINIVIL,ZESTRIL) 10 MG tablet Take 10 mg by mouth at bedtime.    . OXYGEN Inhale 3-4 L into the lungs continuous.     . pantoprazole (PROTONIX) 40 MG tablet Take 40 mg by mouth daily. Received [facility]    . pravastatin  (PRAVACHOL) 20 MG tablet TAKE 1 TABLET BY MOUTH AT BEDTIME 90 tablet 3  . tamsulosin (FLOMAX) 0.4 MG CAPS capsule Take 0.4 mg by mouth daily. Received from [facility]     . Tiotropium Bromide Monohydrate (SPIRIVA RESPIMAT) 2.5 MCG/ACT AERS Inhale 1 puff into the lungs daily.     No current facility-administered medications for this visit.    REVIEW OF SYSTEMS:  [X]  denotes positive finding, [ ]  denotes negative finding Cardiac  Comments:  Chest pain or chest pressure:    Shortness of breath upon exertion: x   Short of breath when lying flat:    Irregular heart rhythm:        Vascular    Pain in calf, thigh, or hip brought on by ambulation:    Pain in feet at night that wakes you up from your sleep:     Blood clot in your veins:    Leg swelling:         Pulmonary    Oxygen at home:    Productive cough:     Wheezing:         Neurologic    Sudden weakness in arms or legs:     Sudden numbness in arms or legs:     Sudden onset of difficulty speaking or slurred speech:    Temporary loss of vision in one eye:     Problems with dizziness:         Gastrointestinal    Blood in stool:     Vomited blood:         Genitourinary    Burning when urinating:     Blood in urine:        Psychiatric    Major depression:         Hematologic    Bleeding problems:    Problems with blood clotting too easily:        Skin    Rashes or ulcers:        Constitutional    Fever or chills:     PHYSICAL EXAM:   Vitals:   05/21/19 1539  BP: 103/63  Pulse: 84  Resp: 20  Temp: 98.2 F (36.8 C)  SpO2: 97%  Weight: 135 lb (61.2 kg)  Height: 5\' 9"  (1.753 m)    GENERAL: The patient is a well-nourished male, in no acute distress. The vital signs are documented above. CARDIAC: There is a regular rate and rhythm.  VASCULAR: I do not detect carotid bruits. He has palpable femoral pulses and palpable posterior tibial  pulses bilaterally. PULMONARY: There is good air exchange bilaterally  without wheezing or rales. ABDOMEN: Soft and non-tender with normal pitched bowel sounds.  MUSCULOSKELETAL: There are no major deformities or cyanosis. NEUROLOGIC: No focal weakness or paresthesias are detected. SKIN: There are no ulcers or rashes noted. PSYCHIATRIC: The patient has a normal affect.  DATA:    ARTERIAL DOPPLER STUDY: I have independently interpreted his arterial Doppler study today.  On the right side he has a triphasic dorsalis pedis and posterior tibial signal.  ABIs 100%.  Toe pressure 71 mmHg.  On the left side he has a biphasic dorsalis pedis signal with a triphasic posterior tibial signal.  ABI is 100%.  Toe pressure is 81 mmHg.  MEDICAL ISSUES:   PERIPHERAL VASCULAR DISEASE: I suspect the patient does have some mild underlying peripheral vascular disease however he has good Doppler signals in both feet and a palpable posterior tibial pulses.  His activity is very limited.  I do not think any further work-up is indicated at this time for his peripheral vascular disease.  He has undergone previous endovascular aneurysm repair and his aneurysm shrink nicely in size.  He is due for a follow-up study in 2022 which has already been scheduled.  He will call sooner if he has problems.  Fortunately he is not a smoker.  Deitra Mayo [facility] [phone]

## 2019-06-27 ENCOUNTER — Other Ambulatory Visit: Payer: Self-pay

## 2019-06-27 ENCOUNTER — Emergency Department (HOSPITAL_COMMUNITY): Payer: Medicare Other

## 2019-06-27 ENCOUNTER — Encounter (HOSPITAL_COMMUNITY): Payer: Self-pay

## 2019-06-27 ENCOUNTER — Inpatient Hospital Stay (HOSPITAL_COMMUNITY)
Admission: EM | Admit: 2019-06-27 | Discharge: 2019-07-03 | DRG: 190 | Disposition: A | Discharge status: Hospice | Payer: Medicare Other | Attending: Family Medicine | Admitting: Family Medicine

## 2019-06-27 DIAGNOSIS — R0603 Acute respiratory distress: Secondary | ICD-10-CM | POA: Diagnosis present

## 2019-06-27 DIAGNOSIS — I2510 Atherosclerotic heart disease of native coronary artery without angina pectoris: Secondary | ICD-10-CM | POA: Diagnosis present

## 2019-06-27 DIAGNOSIS — Z7982 Long term (current) use of aspirin: Secondary | ICD-10-CM | POA: Diagnosis not present

## 2019-06-27 DIAGNOSIS — I252 Old myocardial infarction: Secondary | ICD-10-CM

## 2019-06-27 DIAGNOSIS — F411 Generalized anxiety disorder: Secondary | ICD-10-CM | POA: Diagnosis present

## 2019-06-27 DIAGNOSIS — I10 Essential (primary) hypertension: Secondary | ICD-10-CM | POA: Diagnosis present

## 2019-06-27 DIAGNOSIS — Z7951 Long term (current) use of inhaled steroids: Secondary | ICD-10-CM | POA: Diagnosis not present

## 2019-06-27 DIAGNOSIS — Z9981 Dependence on supplemental oxygen: Secondary | ICD-10-CM

## 2019-06-27 DIAGNOSIS — Z66 Do not resuscitate: Secondary | ICD-10-CM | POA: Diagnosis present

## 2019-06-27 DIAGNOSIS — Z8679 Personal history of other diseases of the circulatory system: Secondary | ICD-10-CM

## 2019-06-27 DIAGNOSIS — E785 Hyperlipidemia, unspecified: Secondary | ICD-10-CM | POA: Diagnosis present

## 2019-06-27 DIAGNOSIS — J441 Chronic obstructive pulmonary disease with (acute) exacerbation: Secondary | ICD-10-CM | POA: Diagnosis present

## 2019-06-27 DIAGNOSIS — R0600 Dyspnea, unspecified: Secondary | ICD-10-CM

## 2019-06-27 DIAGNOSIS — K219 Gastro-esophageal reflux disease without esophagitis: Secondary | ICD-10-CM | POA: Diagnosis present

## 2019-06-27 DIAGNOSIS — J9622 Acute and chronic respiratory failure with hypercapnia: Secondary | ICD-10-CM | POA: Diagnosis present

## 2019-06-27 DIAGNOSIS — Z87891 Personal history of nicotine dependence: Secondary | ICD-10-CM

## 2019-06-27 DIAGNOSIS — Z79899 Other long term (current) drug therapy: Secondary | ICD-10-CM | POA: Diagnosis not present

## 2019-06-27 DIAGNOSIS — Z7189 Other specified counseling: Secondary | ICD-10-CM

## 2019-06-27 DIAGNOSIS — Z20822 Contact with and (suspected) exposure to covid-19: Secondary | ICD-10-CM | POA: Diagnosis present

## 2019-06-27 DIAGNOSIS — Z515 Encounter for palliative care: Secondary | ICD-10-CM | POA: Diagnosis not present

## 2019-06-27 DIAGNOSIS — F329 Major depressive disorder, single episode, unspecified: Secondary | ICD-10-CM | POA: Diagnosis present

## 2019-06-27 DIAGNOSIS — N400 Enlarged prostate without lower urinary tract symptoms: Secondary | ICD-10-CM | POA: Diagnosis present

## 2019-06-27 DIAGNOSIS — J9621 Acute and chronic respiratory failure with hypoxia: Secondary | ICD-10-CM | POA: Diagnosis present

## 2019-06-27 DIAGNOSIS — F419 Anxiety disorder, unspecified: Secondary | ICD-10-CM | POA: Diagnosis present

## 2019-06-27 LAB — BLOOD GAS, ARTERIAL
Acid-Base Excess: 6 mmol/L — ABNORMAL HIGH (ref 0.0–2.0)
Bicarbonate: 29.2 mmol/L — ABNORMAL HIGH (ref 20.0–28.0)
FIO2: 40
O2 Saturation: 93.9 %
Patient temperature: 37
pCO2 arterial: 49.9 mmHg — ABNORMAL HIGH (ref 32.0–48.0)
pH, Arterial: 7.405 (ref 7.350–7.450)
pO2, Arterial: 74 mmHg — ABNORMAL LOW (ref 83.0–108.0)

## 2019-06-27 LAB — URINALYSIS, ROUTINE W REFLEX MICROSCOPIC
Bacteria, UA: NONE SEEN
Bilirubin Urine: NEGATIVE
Glucose, UA: NEGATIVE mg/dL
Ketones, ur: NEGATIVE mg/dL
Leukocytes,Ua: NEGATIVE
Nitrite: NEGATIVE
Protein, ur: NEGATIVE mg/dL
Specific Gravity, Urine: 1.015 (ref 1.005–1.030)
pH: 7 (ref 5.0–8.0)

## 2019-06-27 LAB — BASIC METABOLIC PANEL
Anion gap: 11 (ref 5–15)
BUN: 19 mg/dL (ref 8–23)
CO2: 30 mmol/L (ref 22–32)
Calcium: 9.2 mg/dL (ref 8.9–10.3)
Chloride: 94 mmol/L — ABNORMAL LOW (ref 98–111)
Creatinine, Ser: 0.67 mg/dL (ref 0.61–1.24)
GFR calc Af Amer: >60 mL/min (ref >60)
GFR calc non Af Amer: >60 mL/min (ref >60)
Glucose, Bld: 118 mg/dL — ABNORMAL HIGH (ref 70–99)
Potassium: 4.7 mmol/L (ref 3.5–5.1)
Sodium: 135 mmol/L (ref 135–145)

## 2019-06-27 LAB — RESPIRATORY PANEL BY RT PCR (FLU A&B, COVID)
Influenza A by PCR: NEGATIVE
Influenza B by PCR: NEGATIVE
SARS Coronavirus 2 by RT PCR: NEGATIVE

## 2019-06-27 LAB — CBC
HCT: 40.5 % (ref 39.0–52.0)
Hemoglobin: 12.1 g/dL — ABNORMAL LOW (ref 13.0–17.0)
MCH: 26.7 pg (ref 26.0–34.0)
MCHC: 29.9 g/dL — ABNORMAL LOW (ref 30.0–36.0)
MCV: 89.2 fL (ref 80.0–100.0)
Platelets: 301 10*3/uL (ref 150–400)
RBC: 4.54 MIL/uL (ref 4.22–5.81)
RDW: 14.9 % (ref 11.5–15.5)
WBC: 7.2 10*3/uL (ref 4.0–10.5)
nRBC: 0 % (ref 0.0–0.2)

## 2019-06-27 LAB — POC SARS CORONAVIRUS 2 AG -  ED: SARS Coronavirus 2 Ag: NEGATIVE

## 2019-06-27 LAB — LACTIC ACID, PLASMA
Lactic Acid, Venous: 1.1 mmol/L (ref 0.5–1.9)
Lactic Acid, Venous: 1.1 mmol/L (ref 0.5–1.9)

## 2019-06-27 MED ORDER — MAGNESIUM SULFATE 2 GM/50ML IV SOLN
2.0000 g | Freq: Once | INTRAVENOUS | Status: AC
  Administered 2019-06-27: 2 g via INTRAVENOUS
  Filled 2019-06-27: qty 50

## 2019-06-27 MED ORDER — IPRATROPIUM BROMIDE HFA 17 MCG/ACT IN AERS: 2.0000 | INHALATION_SPRAY | Freq: Once | RESPIRATORY_TRACT | Status: DC

## 2019-06-27 MED ORDER — TRAMADOL HCL 50 MG PO TABS
50.0000 mg | ORAL_TABLET | Freq: Three times a day (TID) | ORAL | Status: DC | PRN
  Administered 2019-06-28 – 2019-07-02 (×6): 50 mg via ORAL
  Filled 2019-06-27 (×6): qty 1

## 2019-06-27 MED ORDER — ALPRAZOLAM 0.5 MG PO TABS: 1.0000 mg | ORAL_TABLET | Freq: Every evening | ORAL | Status: DC | PRN

## 2019-06-27 MED ORDER — IPRATROPIUM-ALBUTEROL 0.5-2.5 (3) MG/3ML IN SOLN: 3.0000 mL | Freq: Four times a day (QID) | RESPIRATORY_TRACT | Status: DC | PRN

## 2019-06-27 MED ORDER — UMECLIDINIUM BROMIDE 62.5 MCG/INH IN AEPB
1.0000 | INHALATION_SPRAY | Freq: Every day | RESPIRATORY_TRACT | Status: DC
  Administered 2019-06-28 – 2019-06-29 (×2): 1 via RESPIRATORY_TRACT
  Filled 2019-06-27: qty 7

## 2019-06-27 MED ORDER — ALPRAZOLAM 0.5 MG PO TABS
1.0000 mg | ORAL_TABLET | Freq: Every day | ORAL | Status: DC
  Administered 2019-06-27 – 2019-06-28 (×2): 1 mg via ORAL
  Filled 2019-06-27 (×2): qty 2

## 2019-06-27 MED ORDER — FLUTICASONE FUROATE-VILANTEROL 200-25 MCG/INH IN AEPB
1.0000 | INHALATION_SPRAY | Freq: Every day | RESPIRATORY_TRACT | Status: DC
  Administered 2019-06-28: 1 via RESPIRATORY_TRACT
  Filled 2019-06-27: qty 28

## 2019-06-27 MED ORDER — METHYLPREDNISOLONE SODIUM SUCC 40 MG IJ SOLR
40.0000 mg | Freq: Four times a day (QID) | INTRAMUSCULAR | Status: DC
  Administered 2019-06-27 – 2019-06-28 (×3): 40 mg via INTRAVENOUS
  Filled 2019-06-27 (×3): qty 1

## 2019-06-27 MED ORDER — PRAVASTATIN SODIUM 10 MG PO TABS
20.0000 mg | ORAL_TABLET | Freq: Every day | ORAL | Status: DC
  Administered 2019-06-27 – 2019-07-02 (×6): 20 mg via ORAL
  Filled 2019-06-27: qty 1
  Filled 2019-06-27 (×2): qty 2
  Filled 2019-06-27: qty 1
  Filled 2019-06-27: qty 2
  Filled 2019-06-27: qty 1
  Filled 2019-06-27 (×3): qty 2

## 2019-06-27 MED ORDER — TAMSULOSIN HCL 0.4 MG PO CAPS
0.4000 mg | ORAL_CAPSULE | Freq: Every day | ORAL | Status: DC
  Administered 2019-06-28 – 2019-07-03 (×6): 0.4 mg via ORAL
  Filled 2019-06-27 (×6): qty 1

## 2019-06-27 MED ORDER — FLUTICASONE-UMECLIDIN-VILANT 200-62.5-25 MCG/INH IN AEPB: 1.0000 | INHALATION_SPRAY | Freq: Every day | RESPIRATORY_TRACT | Status: DC

## 2019-06-27 MED ORDER — ALBUTEROL (5 MG/ML) CONTINUOUS INHALATION SOLN
10.0000 mg/h | INHALATION_SOLUTION | Freq: Once | RESPIRATORY_TRACT | Status: AC
  Administered 2019-06-27: 10 mg/h via RESPIRATORY_TRACT
  Filled 2019-06-27: qty 20

## 2019-06-27 MED ORDER — AEROCHAMBER PLUS FLO-VU LARGE MISC
1.0000 | Freq: Once | Status: AC
  Administered 2019-06-27: 1
  Filled 2019-06-27: qty 1

## 2019-06-27 MED ORDER — HYDROXYZINE HCL 25 MG PO TABS
25.0000 mg | ORAL_TABLET | Freq: Once | ORAL | Status: AC
  Administered 2019-06-27: 25 mg via ORAL
  Filled 2019-06-27: qty 1

## 2019-06-27 MED ORDER — ALBUTEROL SULFATE HFA 108 (90 BASE) MCG/ACT IN AERS
4.0000 | INHALATION_SPRAY | RESPIRATORY_TRACT | Status: DC
  Filled 2019-06-27: qty 6.7

## 2019-06-27 MED ORDER — ALBUTEROL SULFATE (2.5 MG/3ML) 0.083% IN NEBU: 2.5000 mg | INHALATION_SOLUTION | Freq: Four times a day (QID) | RESPIRATORY_TRACT | Status: DC | PRN

## 2019-06-27 MED ORDER — IPRATROPIUM-ALBUTEROL 20-100 MCG/ACT IN AERS: 2.0000 | INHALATION_SPRAY | Freq: Four times a day (QID) | RESPIRATORY_TRACT | Status: DC | PRN

## 2019-06-27 MED ORDER — ONDANSETRON HCL 4 MG PO TABS: 4.0000 mg | ORAL_TABLET | Freq: Four times a day (QID) | ORAL | Status: DC | PRN

## 2019-06-27 MED ORDER — CHOLECALCIFEROL 25 MCG (1000 UT) PO CAPS: 1000.0000 [IU] | ORAL_CAPSULE | Freq: Every day | ORAL | Status: DC

## 2019-06-27 MED ORDER — SODIUM CHLORIDE 0.9% FLUSH: 3.0000 mL | INTRAVENOUS | Status: DC | PRN

## 2019-06-27 MED ORDER — ENSURE ENLIVE PO LIQD
237.0000 mL | Freq: Two times a day (BID) | ORAL | Status: DC
  Administered 2019-06-28: 237 mL via ORAL

## 2019-06-27 MED ORDER — VITAMIN D 25 MCG (1000 UNIT) PO TABS
1000.0000 [IU] | ORAL_TABLET | Freq: Every day | ORAL | Status: DC
  Administered 2019-06-28 – 2019-07-03 (×6): 1000 [IU] via ORAL
  Filled 2019-06-27 (×9): qty 1

## 2019-06-27 MED ORDER — ENOXAPARIN SODIUM 40 MG/0.4ML ~~LOC~~ SOLN
40.0000 mg | SUBCUTANEOUS | Status: DC
  Administered 2019-06-27 – 2019-07-02 (×6): 40 mg via SUBCUTANEOUS
  Filled 2019-06-27 (×6): qty 0.4

## 2019-06-27 MED ORDER — ASPIRIN EC 81 MG PO TBEC
81.0000 mg | DELAYED_RELEASE_TABLET | Freq: Every day | ORAL | Status: DC
  Administered 2019-06-28 – 2019-07-03 (×6): 81 mg via ORAL
  Filled 2019-06-27 (×6): qty 1

## 2019-06-27 MED ORDER — ALBUTEROL SULFATE HFA 108 (90 BASE) MCG/ACT IN AERS: 2.0000 | INHALATION_SPRAY | Freq: Four times a day (QID) | RESPIRATORY_TRACT | Status: DC | PRN

## 2019-06-27 MED ORDER — IPRATROPIUM-ALBUTEROL 20-100 MCG/ACT IN AERS
2.0000 | INHALATION_SPRAY | Freq: Four times a day (QID) | RESPIRATORY_TRACT | Status: DC
  Administered 2019-06-27: 2 via RESPIRATORY_TRACT
  Filled 2019-06-27: qty 4

## 2019-06-27 MED ORDER — ALPRAZOLAM 0.5 MG PO TABS
0.5000 mg | ORAL_TABLET | Freq: Every day | ORAL | Status: DC | PRN
  Administered 2019-06-28: 1 mg via ORAL
  Filled 2019-06-27 (×2): qty 2

## 2019-06-27 MED ORDER — LISINOPRIL 10 MG PO TABS: 10.0000 mg | ORAL_TABLET | Freq: Every day | ORAL | Status: DC

## 2019-06-27 MED ORDER — POLYETHYLENE GLYCOL 3350 17 G PO PACK: 17.0000 g | PACK | Freq: Every day | ORAL | Status: DC | PRN

## 2019-06-27 MED ORDER — SODIUM CHLORIDE 0.9 % IV SOLN: 250.0000 mL | INTRAVENOUS | Status: DC | PRN

## 2019-06-27 MED ORDER — IPRATROPIUM-ALBUTEROL 0.5-2.5 (3) MG/3ML IN SOLN
3.0000 mL | Freq: Four times a day (QID) | RESPIRATORY_TRACT | Status: DC | PRN
  Administered 2019-06-28 – 2019-06-29 (×3): 3 mL via RESPIRATORY_TRACT
  Filled 2019-06-27 (×3): qty 3

## 2019-06-27 MED ORDER — ESCITALOPRAM OXALATE 10 MG PO TABS
20.0000 mg | ORAL_TABLET | Freq: Every day | ORAL | Status: DC
  Administered 2019-06-28 – 2019-07-03 (×6): 20 mg via ORAL
  Filled 2019-06-27 (×6): qty 2

## 2019-06-27 MED ORDER — PANTOPRAZOLE SODIUM 40 MG PO TBEC
40.0000 mg | DELAYED_RELEASE_TABLET | Freq: Every day | ORAL | Status: DC
  Administered 2019-06-28 – 2019-07-03 (×6): 40 mg via ORAL
  Filled 2019-06-27 (×6): qty 1

## 2019-06-27 MED ORDER — LACTATED RINGERS IV SOLN: INTRAVENOUS | Status: DC

## 2019-06-27 MED ORDER — ALBUTEROL SULFATE HFA 108 (90 BASE) MCG/ACT IN AERS
4.0000 | INHALATION_SPRAY | Freq: Once | RESPIRATORY_TRACT | Status: AC
  Administered 2019-06-27: 4 via RESPIRATORY_TRACT

## 2019-06-27 MED ORDER — PREDNISONE 20 MG PO TABS: 40.0000 mg | ORAL_TABLET | Freq: Every day | ORAL | Status: DC

## 2019-06-27 MED ORDER — FINASTERIDE 5 MG PO TABS
5.0000 mg | ORAL_TABLET | Freq: Every day | ORAL | Status: DC
  Administered 2019-06-27 – 2019-07-02 (×6): 5 mg via ORAL
  Filled 2019-06-27 (×9): qty 1

## 2019-06-27 MED ORDER — BISACODYL 10 MG RE SUPP: 10.0000 mg | Freq: Every day | RECTAL | Status: DC | PRN

## 2019-06-27 MED ORDER — SODIUM CHLORIDE 0.9% FLUSH
3.0000 mL | Freq: Two times a day (BID) | INTRAVENOUS | Status: DC
  Administered 2019-06-27 – 2019-07-03 (×10): 3 mL via INTRAVENOUS

## 2019-06-27 MED ORDER — ONDANSETRON HCL 4 MG/2ML IJ SOLN
4.0000 mg | Freq: Four times a day (QID) | INTRAMUSCULAR | Status: DC | PRN
  Administered 2019-06-29: 4 mg via INTRAVENOUS
  Filled 2019-06-27: qty 2

## 2019-06-27 NOTE — ED Notes (Signed)
Pt with increased anxiety during continuous neb  Tripod posture   Labored resp with increased work of breathing   Pt on Bipap with better color and meds for anxiety

## 2019-06-27 NOTE — ED Notes (Addendum)
Call to [place] [phone]  With update per pt request   Pt informed

## 2019-06-27 NOTE — H&P (Addendum)
History and Physical    Patient Demographics:    Jay Mullins [id number] DOB: [birth year] DOA: 06/27/2019  PCP: Jay Noon, MD  Patient coming from: Home  I have personally briefly reviewed patient's old medical records in [place]  Chief Complaint: Shortness of breath   Assessment & Plan:     Assessment/Plan Principal Problem:   COPD with acute exacerbation ([place]) Active Problems:   History of abdominal aortic aneurysm (AAA)   Acute on chronic respiratory failure with hypoxia and hypercapnia ([place])   Essential (primary) hypertension   Anxiety     Principal Problem: Acute exacerbation of COPD Patient presented with worsening shortness of breath and wheezing, 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  No evidence of pneumonia on chest x-ray.  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 -We will place on nebulizers, steroids -Continue Trelegy Ellipta  Other Active Problems: Acute on chronic hypoxemic and hypercapnic respiratory failure Patient is chronically on 5 L of home oxygen 24 hours a day.  Noted to have worsening shortness of breath and hypoxemia on presentation.  Placed on BiPAP in the ER. -Continue oxygen as needed -Continue BiPAP and monitor blood gases  Hypertension: -Continue lisinopril  Hyperlipidemia -Continue pravastatin  GERD -Continue pantoprazole  BPH -Continue tamsulosin, finasteride  Depression / Anxiety disorder -Continue Lexapro, Xanax as needed  History of abdominal aortic aneurysm He underwent endovascular aneurysm repair of a 5.6 cm infrarenal abdominal aortic aneurysm in May 2018. On 11/06/2018 his duplex scan showed that the largest diameter of his aneurysm was 4.5 cm.   DVT prophylaxis: Lovenox Code Status:  Full code Family Communication: N/A  Disposition Plan: Admitted as inpatient for acute COPD exacerbation, expect 3-day inpatient stay Consults called: N/A Admission status: Inpatient  status    HPI:     HPI: Jay Mullins is a 69 y.o. male with medical history significant of COPD, hypertension, hyperlipidemia, GERD, depression, anxiety who presented to the ER with worsening shortness of breath.  Patient reports a 3 to 4-day history of increased shortness of breath, wheezing, dyspnea on exertion.  No significant cough or expectoration. No nausea, vomiting, abdominal pain, diarrhea, dysuria, chest pain, dizziness, lightheadedness, seizures, syncope. ED Course:  Vital Signs reviewed on presentation, significant for temperature 98.9, heart rate 95, blood pressure 149/79, saturation 100% on 7 L nasal cannula. Labs reviewed, significant for sodium 135, potassium 4.7, BUN 19, creatinine 0.67, lactic acid 1.1, WBC count 7.2, hemoglobin 12.1 hematocrit 40, platelets 301.  SARS Covid RT-PCR, flu PCR negative.  Urinalysis is negative. Imaging personally Reviewed, chest x-ray shows no acute cardiopulmonary process.  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 EKG personally reviewed, shows sinus rhythm, no acute ST-T changes    Review of systems:    Review of Systems: As per HPI otherwise 10 point review of systems negative.  All other review of systems is negative except the ones noted above in the HPI.    Past Medical and Surgical History:  Reviewed by me  Past Medical History:  Diagnosis Date  . AAA (abdominal aortic aneurysm) ([place])    infrarenal AAA  . Aortic aneurysm ([place])   . Arthritis   . Benign prostatic hyperplasia   . COPD (chronic obstructive pulmonary disease) ([place])   . Depression   . Dyspnea    on exertion  . GERD (gastroesophageal reflux disease)   . Headache   . Hypertension   . Myocardial infarction ([place]) 1980's  .  On home oxygen therapy    "2l; 24/7 when I'm home" (07/27/2016)  . Panlobular emphysema ([place])   . Pneumonia    9/18  . Prostate disorder     Past Surgical History:  Procedure Laterality Date  . ABDOMINAL AORTIC ENDOVASCULAR  STENT GRAFT N/A 07/27/2016   Procedure: ABDOMINAL AORTIC ENDOVASCULAR STENT GRAFT;  Surgeon: Angelia Mould, MD;  Location: [facility];  Service: Vascular;  Laterality: N/A;  . BRAIN SURGERY  Jul 14, 2013   Mass front Left side  . CARDIAC CATHETERIZATION  1980's  . ENDOSCOPIC TRANS NASAL APPROACH WITH FUSION N/A 01/29/2017   Procedure: ENDOSCOPIC TRANS NASAL APPROACH AND TRANS ORAL APPROACH WITH EXCISION PAPALOM;  Surgeon: Melida Quitter, MD;  Location: [facility];  Service: ENT;  Laterality: N/A;  . LAPAROTOMY  1970's   ulcer ruptured     Social History:  Reviewed by me   reports that he quit smoking about 3 years ago. His smoking use included cigarettes. He has a 40.00 pack-year smoking history. He has quit using smokeless tobacco.  His smokeless tobacco use included snuff and chew. He reports that he does not drink alcohol or use drugs.  Allergies:    Allergies  Allergen Reactions  . Lorazepam Other (See Comments)    hallucinations hallucinations hallucinations    Family History :   Family History  Problem Relation Age of Onset  . COPD Sister    Family history reviewed, noted as above, not pertinent to current presentation.   Home Medications:    Prior to Admission medications   Medication Sig Start Date End Date Taking? Authorizing Provider  albuterol (PROVENTIL HFA;VENTOLIN HFA) 108 (90 Base) MCG/ACT inhaler Inhale 2 puffs into the lungs every 6 (six) hours as needed for wheezing or shortness of breath.    [provider]  ALPRAZolam Duanne Moron) 1 MG tablet Take 1-2 mg at bedtime as needed by mouth for anxiety (sleep). Take 1 tablet (1 mg) by mouth daily at bedtime, may also take 1/2-1 tablet (0.5 - 1 mg) during the day as needed for anxiety     [provider]  aspirin EC 81 MG tablet Take 81 mg by mouth daily.    [provider]  Aspirin-Salicylamide-Caffeine (BC HEADACHE) 325-95-16 MG TABS Take 1 packet by mouth once as needed (FOR PAIN).     [provider]  budesonide-formoterol (SYMBICORT) 160-4.5 MCG/ACT inhaler Inhale 1 puff into the lungs 2 (two) times daily.     [provider]  butalbital-acetaminophen-caffeine (FIORICET, ESGIC) 50-325-40 MG tablet Take 2 tablets every 4 (four) hours as needed by mouth for headache.     [provider]  Cholecalciferol (D 1000) 1000 units capsule Take 1,000 Units by mouth daily.     [provider]  colchicine 0.6 MG tablet Take 0.6 mg by mouth every 6 (six) hours as needed (gout flares). [place]    [provider]  escitalopram (LEXAPRO) 20 MG tablet Take 20 mg by mouth daily.    [provider]  finasteride (PROSCAR) 5 MG tablet Take 5 mg by mouth at bedtime.  10/17/15   [provider]  Fluticasone-Umeclidin-Vilant (TRELEGY ELLIPTA) 200-62.5-25 MCG/INH AEPB Inhale into the lungs. 05/06/19   [provider]  hydrOXYzine (ATARAX/VISTARIL) 25 MG tablet TAKE 1 TABLET(25 MG) BY MOUTH THREE TIMES DAILY FOR UP TO 10 DAYS AS NEEDED FOR ITCHING 07/08/18   [provider]  ipratropium-albuterol (DUONEB) 0.5-2.5 (3) MG/3ML SOLN Inhale 3 mLs into the lungs every  6 (six) hours as needed (shortness of breath/wheezing).  11/26/14   [provider]  lisinopril (PRINIVIL,ZESTRIL) 10 MG tablet Take 10 mg by mouth at bedtime.    [provider]  OXYGEN Inhale 3-4 L into the lungs continuous.     [provider]  pantoprazole (PROTONIX) 40 MG tablet Take 40 mg by mouth daily. Received from [facility]    [provider]  pravastatin (PRAVACHOL) 20 MG tablet TAKE 1 TABLET BY MOUTH AT BEDTIME 11/12/18   Fay Records, MD  tamsulosin (FLOMAX) 0.4 MG CAPS capsule Take 0.4 mg by mouth daily. Received from [facility]     [provider]  Tiotropium Bromide Monohydrate (SPIRIVA RESPIMAT) 2.5 MCG/ACT AERS Inhale 1 puff into the lungs daily.    [provider]    Physical Exam:      Physical Exam: Vitals:   06/27/19 1800 06/27/19 1830 06/27/19 1900 06/27/19 1908  BP: (!) 142/110 (!) 158/108  (!) 149/79  Pulse: 87 (!) 102 92 95  Resp: (!) 22 (!) 38 (!) 36 18  Temp:    98.9 F (37.2 C)  TempSrc:    Oral  SpO2: 100% 100% 100% 100%  Weight:      Height:        Constitutional: NAD, calm, comfortable Vitals:   06/27/19 1800 06/27/19 1830 06/27/19 1900 06/27/19 1908  BP: (!) 142/110 (!) 158/108  (!) 149/79  Pulse: 87 (!) 102 92 95  Resp: (!) 22 (!) 38 (!) 36 18  Temp:    98.9 F (37.2 C)  TempSrc:    Oral  SpO2: 100% 100% 100% 100%  Weight:      Height:       Eyes: PERRL, lids and conjunctivae normal ENMT: Mucous membranes are moist. Posterior pharynx clear of any exudate or lesions.Normal dentition.  Neck: normal, supple, no masses, no thyromegaly Respiratory: mild bilateral wheezing, mild respiratory distress, on BiPAP.  No crackles. Normal respiratory effort. No accessory muscle use.  Cardiovascular: Regular rate and rhythm, no murmurs / rubs / gallops. No extremity edema. 2+ pedal pulses. No carotid bruits.  Abdomen: no tenderness, no masses palpated. No hepatosplenomegaly. Bowel sounds positive.  Musculoskeletal: no clubbing / cyanosis. No joint deformity upper and lower extremities. Good ROM, no contractures. Normal muscle tone.  Skin: no rashes, lesions, ulcers. No induration Neurologic: CN 2-12 grossly intact. Sensation intact, DTR normal. Strength 5/5 in all 4.  Psychiatric: Normal judgment and insight. Alert and oriented x 3. Normal mood.    Decubitus Ulcers: Not present on admission Catheters and tubes: None  Data Review:    Labs on Admission: I have personally reviewed following labs and imaging studies  CBC: Recent Labs  Lab 06/27/19 1540  WBC 7.2  HGB 12.1*  HCT 40.5  MCV 89.2  PLT [id number]   Basic Metabolic Panel: Recent Labs  Lab 06/27/19 1540  NA 135  K 4.7  CL 94*  CO2 30  GLUCOSE 118*  BUN 19  CREATININE 0.67  CALCIUM  9.2   GFR: Estimated Creatinine Clearance: 82.6 mL/min (by C-G formula based on SCr of 0.67 mg/dL). Liver Function Tests: No results for input(s): AST, ALT, ALKPHOS, BILITOT, PROT, ALBUMIN in the last 168 hours. No results for input(s): LIPASE, AMYLASE in the last 168 hours. No results for input(s): AMMONIA in the last 168 hours. Coagulation Profile: No results for input(s): INR, PROTIME in the last 168 hours. Cardiac Enzymes: No results for input(s): CKTOTAL, CKMB, CKMBINDEX,  TROPONINI in the last 168 hours. BNP (last 3 results) No results for input(s): PROBNP in the last 8760 hours. HbA1C: No results for input(s): HGBA1C in the last 72 hours. CBG: No results for input(s): GLUCAP in the last 168 hours. Lipid Profile: No results for input(s): CHOL, HDL, LDLCALC, TRIG, CHOLHDL, LDLDIRECT in the last 72 hours. Thyroid Function Tests: No results for input(s): TSH, T4TOTAL, FREET4, T3FREE, THYROIDAB in the last 72 hours. Anemia Panel: No results for input(s): VITAMINB12, FOLATE, FERRITIN, TIBC, IRON, RETICCTPCT in the last 72 hours. Urine analysis:    Component Value Date/Time   COLORURINE YELLOW 06/27/2019 1622   APPEARANCEUR CLEAR 06/27/2019 1622   LABSPEC 1.015 06/27/2019 1622   PHURINE 7.0 06/27/2019 1622   GLUCOSEU NEGATIVE 06/27/2019 1622   HGBUR MODERATE (A) 06/27/2019 1622   BILIRUBINUR NEGATIVE 06/27/2019 1622   KETONESUR NEGATIVE 06/27/2019 1622   PROTEINUR NEGATIVE 06/27/2019 1622   UROBILINOGEN 1.0 12/13/2014 1248   NITRITE NEGATIVE 06/27/2019 1622   LEUKOCYTESUR NEGATIVE 06/27/2019 1622     Imaging Results:      Radiological Exams on Admission: DG Chest Port 1 View  Result Date: 06/27/2019 CLINICAL DATA:  69 year old male with shortness of breath. EXAM: PORTABLE CHEST 1 VIEW COMPARISON:  Chest radiograph dated 09/04/2017 and CT dated 03/07/2018. FINDINGS: There is severe emphysema with severe bullous changes in the right lung base. No focal consolidation,  pleural effusion, pneumothorax. The cardiac silhouette is within normal limits. There is prominence of the central pulmonary arteries suggestive of a degree of pulmonary hypertension. Clinical correlation is recommended. A 7 mm nodular density over the right mid lung field likely represents nipple shadow. No acute osseous pathology. IMPRESSION: 1. No acute cardiopulmonary process. 2. Severe emphysema with severe bullous changes in the right lung base. Electronically Signed   By: Anner Crete M.D.   On: 06/27/2019 16:13      Auset Fritzler Ginette Otto MD [facility]  If 7PM-7AM, please contact night-coverage   06/27/2019, 7:54 PM

## 2019-06-27 NOTE — ED Notes (Addendum)
Pt reports "3-4 days  of feeling bad"  Hx of COPD, Emphysema   Reports a little better since breathing treatment on the way in   Xray, labs, EKG per standing orders   PA in to assess

## 2019-06-27 NOTE — ED Notes (Signed)
Report to [place], [place]

## 2019-06-27 NOTE — ED Triage Notes (Signed)
Call to Jay Mullins to apprise of room number [phone]

## 2019-06-27 NOTE — ED Provider Notes (Signed)
[facility] EMERGENCY DEPARTMENT Provider Note   CSN: [id number] Arrival date & time: 06/27/19  1516     History Chief Complaint  Patient presents with  . Shortness of Breath    Jay Mullins is a 69 y.o. male with pertinent past medical history of COPD, AAA, aortic aneurysm, hypertension, pan lobar emphysema, that presents to the emergency department via EMS for shortness of breath.  He states that he started having shortness of breath about 4 days ago, he states that he was not doing anything unusual and was just sitting there watching TV.  He states that he never goes outside.  He denies any new cough with sputum, fevers, chills, chest pain, dizziness, headache, abdominal pain, back pain, neck pain, nausea, vomiting, diarrhea.  He has not gotten his Covid vaccinations.He states that he quit smoking about 3 years ago.  He denies being around anyone sick.  He states that he has been eating and drinking normally.  He states that he has been medication compliant.  He he is normally on 5 L of oxygen at home.   HPI     Past Medical History:  Diagnosis Date  . AAA (abdominal aortic aneurysm) ([place])    infrarenal AAA  . Aortic aneurysm ([place])   . Arthritis   . Benign prostatic hyperplasia   . COPD (chronic obstructive pulmonary disease) ([place])   . Depression   . Dyspnea    on exertion  . GERD (gastroesophageal reflux disease)   . Headache   . Hypertension   . Myocardial infarction ([place]) 1980's  . On home oxygen therapy    "2l; 24/7 when I'm home" (07/27/2016)  . Panlobular emphysema ([place])   . Pneumonia    9/18  . Prostate disorder     Patient Active Problem List   Diagnosis Date Noted  . Community acquired pneumonia of left lower lobe of lung 08/23/2017  . Goals of care, counseling/discussion   . Palliative care by specialist   . DNR (do not resuscitate) discussion   . Severe protein-calorie malnutrition ([place]) 08/18/2017  . Anxiety   . 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  Acute on chronic respiratory failure with hypoxia and hypercapnia ([place]) 11/18/2016  . History of abdominal aortic aneurysm (AAA) 07/27/2016  . Current smoker 03/10/2015  . COPD with acute exacerbation ([place]) 03/10/2015  . Meningioma ([place]) 07/14/2013  . Essential (primary) hypertension 07/09/2013  . Atherosclerotic heart disease of native coronary artery without angina pectoris 07/09/2013    Past Surgical History:  Procedure Laterality Date  . ABDOMINAL AORTIC ENDOVASCULAR STENT GRAFT N/A 07/27/2016   Procedure: ABDOMINAL AORTIC ENDOVASCULAR STENT GRAFT;  Surgeon: Angelia Mould, MD;  Location: [facility];  Service: Vascular;  Laterality: N/A;  . BRAIN SURGERY  Jul 14, 2013   Mass front Left side  . CARDIAC CATHETERIZATION  1980's  . ENDOSCOPIC TRANS NASAL APPROACH WITH FUSION N/A 01/29/2017   Procedure: ENDOSCOPIC TRANS NASAL APPROACH AND TRANS ORAL APPROACH WITH EXCISION PAPALOM;  Surgeon: Melida Quitter, MD;  Location: [facility];  Service: ENT;  Laterality: N/A;  . LAPAROTOMY  1970's   ulcer ruptured       Family History  Problem Relation Age of Onset  . COPD Sister     Social History   Tobacco Use  . Smoking status: Former Smoker    Packs/day: 1.00    Years: 40.00    Pack years: 40.00    Types: Cigarettes    Quit date: 2018  Years since quitting: 3.3  . Smokeless tobacco: Former Systems developer    Types: Snuff, Chew  Substance Use Topics  . Alcohol use: No    Alcohol/week: 0.0 standard drinks  . Drug use: No    Home Medications Prior to Admission medications   Medication Sig Start Date End Date Taking? Authorizing Provider  albuterol (PROVENTIL HFA;VENTOLIN HFA) 108 (90 Base) MCG/ACT inhaler Inhale 2 puffs into the lungs every 6 (six) hours as needed for wheezing or shortness of breath.    [provider]  ALPRAZolam Duanne Moron) 1 MG tablet Take 1-2 mg at bedtime as needed by mouth for anxiety (sleep). Take 1 tablet (1 mg) by mouth daily at bedtime, may also take 1/2-1  tablet (0.5 - 1 mg) during the day as needed for anxiety     [provider]  aspirin EC 81 MG tablet Take 81 mg by mouth daily.    [provider]  Aspirin-Salicylamide-Caffeine (BC HEADACHE) 325-95-16 MG TABS Take 1 packet by mouth once as needed (FOR PAIN).    [provider]  budesonide-formoterol (SYMBICORT) 160-4.5 MCG/ACT inhaler Inhale 1 puff into the lungs 2 (two) times daily.     [provider]  butalbital-acetaminophen-caffeine (FIORICET, ESGIC) 50-325-40 MG tablet Take 2 tablets every 4 (four) hours as needed by mouth for headache.     [provider]  Cholecalciferol (D 1000) 1000 units capsule Take 1,000 Units by mouth daily.     [provider]  colchicine 0.6 MG tablet Take 0.6 mg by mouth every 6 (six) hours as needed (gout flares). [place]    [provider]  escitalopram (LEXAPRO) 20 MG tablet Take 20 mg by mouth daily.    [provider]  finasteride (PROSCAR) 5 MG tablet Take 5 mg by mouth at bedtime.  10/17/15   [provider]  Fluticasone-Umeclidin-Vilant (TRELEGY ELLIPTA) 200-62.5-25 MCG/INH AEPB Inhale into the lungs. 05/06/19   [provider]  hydrOXYzine (ATARAX/VISTARIL) 25 MG tablet TAKE 1 TABLET(25 MG) BY MOUTH THREE TIMES DAILY FOR UP TO 10 DAYS AS NEEDED FOR ITCHING 07/08/18   [provider]  ipratropium-albuterol (DUONEB) 0.5-2.5 (3) MG/3ML SOLN Inhale 3 mLs into the lungs every 6 (six) hours as needed (shortness of breath/wheezing).  11/26/14   [provider]  lisinopril (PRINIVIL,ZESTRIL) 10 MG tablet Take 10 mg by mouth at bedtime.    [provider]  OXYGEN Inhale 3-4 L into the lungs continuous.     [provider]  pantoprazole (PROTONIX) 40 MG tablet Take 40 mg by mouth daily. Received from [facility]    [provider]  pravastatin (PRAVACHOL) 20 MG tablet TAKE 1 TABLET BY MOUTH AT BEDTIME 11/12/18   Fay Records, MD    tamsulosin (FLOMAX) 0.4 MG CAPS capsule Take 0.4 mg by mouth daily. Received from [facility]     [provider]  Tiotropium Bromide Monohydrate (SPIRIVA RESPIMAT) 2.5 MCG/ACT AERS Inhale 1 puff into the lungs daily.    [provider]    Allergies    Lorazepam  Review of Systems   Review of Systems  Constitutional: Negative for chills, fatigue and fever.  HENT: Negative for facial swelling, sneezing, sore throat and trouble swallowing.   Eyes: Negative for pain.  Respiratory: Positive for shortness of breath and wheezing. Negative for cough and chest tightness.   Cardiovascular: Negative for chest pain and palpitations.  Gastrointestinal: Negative for abdominal pain, diarrhea, nausea and vomiting.  Genitourinary: Negative for hematuria.  Musculoskeletal: Negative for back pain, joint swelling and neck pain.  Skin: Negative for pallor.  Neurological: Negative for speech difficulty, weakness and headaches.  Psychiatric/Behavioral: Negative for confusion, hallucinations and self-injury.    Physical Exam Updated Vital Signs BP (!) 184/90 (BP Location: Right Arm)   Pulse 90   Temp 97.8 F (36.6 C) (Oral)   Resp (!) 25   Ht 5\' 7"  (1.702 m)   Wt 67.1 kg   SpO2 100%   BMI 23.18 kg/m   Physical Exam .PE: Constitutional: Patient is in respiratory distress. He is using accessory muscles and is tachypneic.Airways patent.  No cyanosis.  Patient is currently on 5 L of oxygen. HENT: normocephalic, atraumatic Cardiovascular: normal rate and rhythm, distal pulses intact Pulmonary/Chest: Increased respiratory effort, patient is using accessory muscles.  Wheezing and rales heard throughout. Abdominal: soft and nontender Musculoskeletal: full ROM, no edema Lymphadenopathy: no cervical adenopathy Neurological: alert with goal directed thinking Skin: warm and dry, no rash, no diaphoresis Psychiatric: normal mood and affect, normal behavior   ED Results / Procedures /  Treatments   Labs (all labs ordered are listed, but only abnormal results are displayed) Labs Reviewed  BASIC METABOLIC PANEL - Abnormal; Notable for the following components:      Result Value   Chloride 94 (*)    Glucose, Bld 118 (*)    All other components within normal limits  CBC - Abnormal; Notable for the following components:   Hemoglobin 12.1 (*)    MCHC 29.9 (*)    All other components within normal limits  LACTIC ACID, PLASMA  LACTIC ACID, PLASMA  POC SARS CORONAVIRUS 2 AG -  ED    EKG EKG Interpretation  Date/Time:  Friday June 27 2019 15:26:44 EDT Ventricular Rate:  87 PR Interval:    QRS Duration: 90 QT Interval:  362 QTC Calculation: 436 R Axis:   -59 Text Interpretation: Sinus rhythm Right atrial enlargement Left anterior fascicular block Probable anteroseptal infarct, old Since last tracing rate slower Confirmed by Noemi Chapel [id number]) on 06/27/2019 4:18:42 PM   Radiology DG Chest Port 1 View  Result Date: 06/27/2019 CLINICAL DATA:  69 year old male with shortness of breath. EXAM: PORTABLE CHEST 1 VIEW COMPARISON:  Chest radiograph dated 09/04/2017 and CT dated 03/07/2018. FINDINGS: There is severe emphysema with severe bullous changes in the right lung base. No focal consolidation, pleural effusion, pneumothorax. The cardiac silhouette is within normal limits. There is prominence of the central pulmonary arteries suggestive of a degree of pulmonary hypertension. Clinical correlation is recommended. A 7 mm nodular density over the right mid lung field likely represents nipple shadow. No acute osseous pathology. IMPRESSION: 1. No acute cardiopulmonary process. 2. Severe emphysema with severe bullous changes in the right lung base. Electronically Signed   By: Anner Crete M.D.   On: 06/27/2019 16:13    Procedures .Critical Care Performed by: Alfredia Client, PA-C Authorized by: Alfredia Client, PA-C   Critical care provider statement:    Critical care time  (minutes):  45   Critical care was necessary to treat or prevent imminent or life-threatening deterioration of the following conditions:  Respiratory failure   Critical care was time spent personally by me on the following activities:  Discussions with consultants, evaluation of patient's response to treatment, examination of patient, ordering and performing treatments and interventions, ordering and review of laboratory studies, ordering and review of radiographic studies, pulse oximetry, re-evaluation of patient's condition, obtaining history from patient or surrogate and review  of old charts   (including critical care time)  Medications Ordered in ED Medications  magnesium sulfate IVPB 2 g 50 mL (has no administration in time range)  Ipratropium-Albuterol (COMBIVENT) respimat 2 puff (has no administration in time range)  AeroChamber Plus Flo-Vu Large MISC 1 each (has no administration in time range)    ED Course  I have reviewed the triage vital signs and the nursing notes.  Pertinent labs & imaging results that were available during my care of the patient were reviewed by me and considered in my medical decision making (see chart for details).    MDM Rules/Calculators/A&P                      Latimer Jodon is a 69 y.o. male with pertinent past medical history of COPD, AAA, aortic aneurysm, hypertension, pan lobar emphysema, that presents to the emergency department via EMS for shortness of breath.  Patient with increased work of breathing, accessory muscle use, lungs with diffuse wheezing and rales.  Patient states that he is medication compliant.  No signs of infection, fever, chills, new cough with sputum.  Patient most likely needs to be admitted for COPD exacerbation.  He was brought in by EMS and they gave him a DuoNeb and Solu-Medrol 125 mg.  Work-up to include EKG, chest x-ray, CBC, CMP, Covid test, lactic test.   .I ordered, reviewed and interpreted labs which included stable CBC,  CMP, lactic acid.  Point-of-care Covid test negative. I ordered medications in the ER to include Combivent Respimat and magnesium sulfate, albuterol inhaler. On reevaluation, pt oxygen saturation is at 100, however pt is still tachypnic at 38. Dr. Sabra Heck notified and will start continuous neb.   I ordered imaging studies to include chest x-ray and I independently reviewed and interpreted by me which showed no acute cardiopulmonary process, it did show severe emphysema with severe bullous changes in the right lung base.   Discussed case with my attending physician who is agreeable to plan to consult for admission due to COPD exacerbation. 7:17 PM discussed case with hospitalist who agrees to accept care of patient.  The patient appears reasonably stabilized for admission considering the current resources, flow, and capabilities available in the ED at this time, and I doubt any other [facility] requiring further screening and/or treatment in the ED prior to admission.  I discussed this case with my attending physician, Dr. Sabra Heck, who cosigned this note including patient's presenting symptoms, physical exam, and planned diagnostics and interventions. Attending physician stated agreement with plan or made changes to plan which were implemented.   Attending physician assessed patient at bedside.     Final Clinical Impression(s) / ED Diagnoses Final diagnoses:  COPD (chronic obstructive pulmonary disease) ([place])  Dyspnea    Rx / DC Orders ED Discharge Orders    None       Alfredia Client, PA-C 06/28/19 0119    Noemi Chapel, MD 06/28/19 [phone]

## 2019-06-27 NOTE — ED Triage Notes (Addendum)
Pt brought in by EMS due to increasing SOB for the past 3 days. On chronic O2 at 5 Lvia [place]. sats 93 % then ambulated to truck sats decrease to 84 %. NRB at 15 L initiated with sats up to 100%. RT notified. Pt given duoneb and solumedrol 125 mg en route. RT notified

## 2019-06-27 NOTE — ED Provider Notes (Signed)
Medical screening examination/treatment/procedure(s) were conducted as a shared visit with non-physician practitioner(s) and myself.  I personally evaluated the patient during the encounter.  Clinical Impression:   Final diagnoses:  Dyspnea  COPD exacerbation ([place])  Acute respiratory distress   Patient is an ill-appearing chronic COPD patient on baseline 5 L by nasal cannula.  He is [facility] by Dr. Michela Pitcher in [place].  He has also been found to have multiple lung nodules on CT in the past  Review of the medical record shows that the patient has had good follow-ups with his family doctor, he has had intermittent flareups of his lung disease with COPD, coughing and history of evaluation for a AAA, he has had an endovascular aneurysm repair and is followed with vascular surgery due to peripheral vascular disease which was managed conservatively successfully.  He presents with increasing shortness of breath over the last several days, he is chronically short of breath but much worse and over the last 24 hours has been severely dyspneic and air hunger.  He was given bronchodilator therapy with a DuoNeb prehospital as well as Solu-Medrol.  On arrival the patient's oxygen is 98 to 100% on increased liters per nasal cannula, he is speaking in shortened sentences have a severe prolonged expiratory phase, there is very diminished air movement  I have personally looked at his x-ray which shows that he has flattened diaphragms, he has severe bullous changes in the right lung base.  This is similar to prior x-rays, EKG shows no acute findings, in fact it is relatively unchanged compared to prior.  Anticipate lab work, ongoing treatment with bronchodilator therapy, likely needs to be admitted to the hospital for the severity of his COPD.  Magnesium infusion R/o Covid  Critically ill Now on Bipap - getting continuous neb. D/w Hospitalist who will  admit.  .Critical Care Performed by: Noemi Chapel, MD Authorized by: Noemi Chapel, MD   Critical care provider statement:    Critical care time (minutes):  35   Critical care time was exclusive of:  Separately billable procedures and treating other patients and teaching time   Critical care was necessary to treat or prevent imminent or life-threatening deterioration of the following conditions:  Respiratory failure   Critical care was time spent personally by me on the following activities:  Blood draw for specimens, development of treatment plan with patient or surrogate, discussions with consultants, evaluation of patient's response to treatment, examination of patient, obtaining history from patient or surrogate, ordering and performing treatments and interventions, ordering and review of laboratory studies, ordering and review of radiographic studies, pulse oximetry, re-evaluation of patient's condition and review of old charts   Medical screening examination/treatment/procedure(s) were conducted as a shared visit with non-physician practitioner(s) and myself.  I personally evaluated the patient during the encounter.  Clinical Impression:   Final diagnoses:  Dyspnea  COPD exacerbation ([place])  Acute respiratory distress           Noemi Chapel, MD 06/28/19 [phone]

## 2019-06-27 NOTE — Progress Notes (Signed)
Pt unable to move PeakFlow meter at all. Pre and Post treatment

## 2019-06-27 NOTE — ED Notes (Signed)
Pt reports breathing a little better

## 2019-06-27 NOTE — Progress Notes (Signed)
Patient placed on BiPAP and moved to icu blood gas is pending. Will do as soon as possible.

## 2019-06-28 ENCOUNTER — Encounter (HOSPITAL_COMMUNITY): Payer: Self-pay | Admitting: Internal Medicine

## 2019-06-28 DIAGNOSIS — I10 Essential (primary) hypertension: Secondary | ICD-10-CM

## 2019-06-28 LAB — BASIC METABOLIC PANEL
Anion gap: 8 (ref 5–15)
BUN: 21 mg/dL (ref 8–23)
CO2: 31 mmol/L (ref 22–32)
Calcium: 8.9 mg/dL (ref 8.9–10.3)
Chloride: 94 mmol/L — ABNORMAL LOW (ref 98–111)
Creatinine, Ser: 0.63 mg/dL (ref 0.61–1.24)
GFR calc Af Amer: >60 mL/min (ref >60)
GFR calc non Af Amer: >60 mL/min (ref >60)
Glucose, Bld: 133 mg/dL — ABNORMAL HIGH (ref 70–99)
Potassium: 4.9 mmol/L (ref 3.5–5.1)
Sodium: 133 mmol/L — ABNORMAL LOW (ref 135–145)

## 2019-06-28 LAB — CBC
HCT: 35.7 % — ABNORMAL LOW (ref 39.0–52.0)
Hemoglobin: 10.9 g/dL — ABNORMAL LOW (ref 13.0–17.0)
MCH: 26.5 pg (ref 26.0–34.0)
MCHC: 30.5 g/dL (ref 30.0–36.0)
MCV: 86.7 fL (ref 80.0–100.0)
Platelets: 318 10*3/uL (ref 150–400)
RBC: 4.12 MIL/uL — ABNORMAL LOW (ref 4.22–5.81)
RDW: 14.8 % (ref 11.5–15.5)
WBC: 5.6 10*3/uL (ref 4.0–10.5)
nRBC: 0 % (ref 0.0–0.2)

## 2019-06-28 LAB — MRSA PCR SCREENING: MRSA by PCR: NEGATIVE

## 2019-06-28 MED ORDER — BUDESONIDE 0.5 MG/2ML IN SUSP
0.5000 mg | Freq: Two times a day (BID) | RESPIRATORY_TRACT | Status: DC
  Administered 2019-06-28 – 2019-07-03 (×11): 0.5 mg via RESPIRATORY_TRACT
  Filled 2019-06-28 (×11): qty 2

## 2019-06-28 MED ORDER — HYDRALAZINE HCL 20 MG/ML IJ SOLN: 5.0000 mg | Freq: Four times a day (QID) | INTRAMUSCULAR | Status: DC | PRN

## 2019-06-28 MED ORDER — LORAZEPAM 2 MG/ML IJ SOLN
0.5000 mg | Freq: Four times a day (QID) | INTRAMUSCULAR | Status: DC | PRN
  Administered 2019-06-28 – 2019-07-03 (×9): 0.5 mg via INTRAVENOUS
  Filled 2019-06-28 (×9): qty 1

## 2019-06-28 MED ORDER — CHLORHEXIDINE GLUCONATE CLOTH 2 % EX PADS
6.0000 | MEDICATED_PAD | Freq: Every day | CUTANEOUS | Status: DC
  Administered 2019-06-28 – 2019-07-03 (×6): 6 via TOPICAL

## 2019-06-28 MED ORDER — METHYLPREDNISOLONE SODIUM SUCC 125 MG IJ SOLR
60.0000 mg | Freq: Four times a day (QID) | INTRAMUSCULAR | Status: DC
  Administered 2019-06-28 – 2019-06-29 (×4): 60 mg via INTRAVENOUS
  Filled 2019-06-28 (×4): qty 2

## 2019-06-28 MED ORDER — METHYLPREDNISOLONE SODIUM SUCC 125 MG IJ SOLR: 60.0000 mg | Freq: Four times a day (QID) | INTRAMUSCULAR | Status: DC

## 2019-06-28 NOTE — Progress Notes (Signed)
Pts BP running continuously 170's this shift- now 201/105 after being checked multiple times. Pt very anxious stating he can't breathe. O2 sats 94% on 5L HFNC- RR 27. X. [place] paged to see if BP medication could be ordered d/t patient not having any PRNs. Waiting for call back/orders. Will continue to monitor pt

## 2019-06-28 NOTE — Progress Notes (Signed)
Breo Ellipta and Incruse Ellipta have been placed in room along with Incentive spirometry. Patient has rolled over on side and is asleep. BiPAP on Standby

## 2019-06-28 NOTE — Progress Notes (Addendum)
Patient is still very anxious can't seem to get comfortable. On BiPAP stated he still couldn't breath didn't feel right. Took off BiPAP placed on HFNC 8 sat 95. Suspect this just anxiety??

## 2019-06-28 NOTE — Progress Notes (Signed)
Administered PRN xanax due to anxiety. Pt insisting that he still can't breathe despite bipap, so Respiratory placed him on HFNC 8L. Will continue to monitor patient.

## 2019-06-28 NOTE — Progress Notes (Addendum)
PROGRESS NOTE  Monnie Picazo [id number] DOB: 11-10-1950 DOA: 06/27/2019 PCP: Chesley Noon, MD  Brief History:  69 year old male with a history of COPD, hypertension, chronic respiratory failure on 5 L, coronary artery disease, AAA, BPH, depression presenting with 5 to 6-day history of worsening shortness of breath and coughing.  He denies any fevers, chills, chest pain, nausea, vomiting, diarrhea, abdominal pain.  He denies any recent sick contacts.  His cough is nonproductive.  He denies any hemoptysis.  The patient quit smoking 3 years ago.  He has over 50-pack-year history of tobacco.  Notably, the patient states that he has been taking BC powders on a daily basis for the last several weeks. In the emergency department, the patient was afebrile hemodynamically stable.  He required BiPAP because of his respiratory distress.  BMP and CBC were essentially unremarkable.  UA was negative for pyuria.  Chest x-ray showed severe emphysema with bullous changes in the right lower lobe.  The patient was admitted for further treatment of his acute on chronic respiratory failure.  Assessment/Plan: Acute on chronic respiratory failure with hypoxia and hypercarbia -Secondary to COPD exacerbation -Patient requiring BiPAP ventilation secondary to his respiratory distress -Wean oxygen back to 5 L as tolerated for saturation 88-92% -Personally reviewed EKG--sinus rhythm, nonspecific T wave changes -Personally reviewed chest x-ray--hyperinflation without any consolidation  COPD exacerbation -Start Pulmicort -Start duo nebs -Continue IV Solu-Medrol  Essential hypertension -Holding lisinopril secondary to increasing potassium  Depression/anxiety -Continue alprazolam and lexapro  Hyperlipidemia -Continue statin  Coronary artery disease -No chest pain presently -Continue aspirin  BPH -Continue tamsulosin and finasteride         Disposition Plan: Patient From: Home D/C  Place: Home- 2-3  Days Barriers: Not Clinically Stable--requiring BiPAP  Family Communication:  No Family at bedside  Consultants:  none  Code Status:   DNR  DVT Prophylaxis:  [place] Lovenox   Procedures: As Listed in Progress Note Above  Antibiotics: None       Subjective: Patient complains of worsening shortness of breath since last evening.  He was placed back on BiPAP at my evaluation time.  He denies any chest pain, nausea, vomiting, diarrhea, abdominal pain, headache.  There is no fevers or chills.  There is no hemoptysis.  Objective: Vitals:   06/28/19 0700 06/28/19 0717 06/28/19 0752 06/28/19 0800  BP: 132/84   113/80  Pulse: 74 73  81  Resp: 19 18  19   Temp:  98.3 F (36.8 C)    TempSrc:  Oral    SpO2: 93% 96% 91% 91%  Weight:      Height:        Intake/Output Summary (Last 24 hours) at 06/28/2019 0844 Last data filed at 06/28/2019 0300 Gross per 24 hour  Intake --  Output 150 ml  Net -150 ml   Weight change:  Exam:   General:  Pt is alert, follows commands appropriately, not in acute distress  HEENT: No icterus, No thrush, No neck mass, [place]/AT  Cardiovascular: RRR, S1/S2, no rubs, no gallops  Respiratory: Diminished breath sounds bilateral.  Bibasilar wheeze.    Abdomen: Soft/+BS, non tender, non distended, no guarding  Extremities: No edema, No lymphangitis, No petechiae, No rashes, no synovitis   Data Reviewed: I have personally reviewed following labs and imaging studies Basic Metabolic Panel: Recent Labs  Lab 06/27/19 1540 06/28/19 0418  NA 135 133*  K 4.7 4.9  CL 94* 94*  CO2 30 31  GLUCOSE 118* 133*  BUN 19 21  CREATININE 0.67 0.63  CALCIUM 9.2 8.9   Liver Function Tests: No results for input(s): AST, ALT, ALKPHOS, BILITOT, PROT, ALBUMIN in the last 168 hours. No results for input(s): LIPASE, AMYLASE in the last 168 hours. No results for input(s): AMMONIA in the last 168 hours. Coagulation Profile: No results for input(s):  INR, PROTIME in the last 168 hours. CBC: Recent Labs  Lab 06/27/19 1540 06/28/19 0418  WBC 7.2 5.6  HGB 12.1* 10.9*  HCT 40.5 35.7*  MCV 89.2 86.7  PLT 301 318   Cardiac Enzymes: No results for input(s): CKTOTAL, CKMB, CKMBINDEX, TROPONINI in the last 168 hours. BNP: Invalid input(s): POCBNP CBG: No results for input(s): GLUCAP in the last 168 hours. HbA1C: No results for input(s): HGBA1C in the last 72 hours. Urine analysis:    Component Value Date/Time   COLORURINE YELLOW 06/27/2019 1622   APPEARANCEUR CLEAR 06/27/2019 1622   LABSPEC 1.015 06/27/2019 1622   PHURINE 7.0 06/27/2019 1622   GLUCOSEU NEGATIVE 06/27/2019 1622   HGBUR MODERATE (A) 06/27/2019 1622   BILIRUBINUR NEGATIVE 06/27/2019 1622   KETONESUR NEGATIVE 06/27/2019 1622   PROTEINUR NEGATIVE 06/27/2019 1622   UROBILINOGEN 1.0 12/13/2014 1248   NITRITE NEGATIVE 06/27/2019 1622   LEUKOCYTESUR NEGATIVE 06/27/2019 1622   Sepsis Labs: @LABRCNTIP (procalcitonin:4,lacticidven:4) ) Recent Results (from the past 240 hour(s))  Respiratory Panel by RT PCR (Flu A&B, Covid) - Nasopharyngeal Swab     Status: None   Collection Time: 06/27/19  7:09 PM   Specimen: Nasopharyngeal Swab  Result Value Ref Range Status   SARS Coronavirus 2 by RT PCR NEGATIVE NEGATIVE Final    Comment: (NOTE) SARS-CoV-2 target nucleic acids are NOT DETECTED. The SARS-CoV-2 RNA is generally detectable in upper respiratoy specimens during the acute phase of infection. The lowest concentration of SARS-CoV-2 viral copies this assay can detect is 131 copies/mL. A negative result does not preclude SARS-Cov-2 infection and should not be used as the sole basis for treatment or other patient management decisions. A negative result may occur with  improper specimen collection/handling, submission of specimen other than nasopharyngeal swab, presence of viral mutation(s) within the areas targeted by this assay, and inadequate number of viral  copies (<131 copies/mL). A negative result must be combined with clinical observations, patient history, and epidemiological information. The expected result is Negative. Fact Sheet for Patients:  PinkCheek.be Fact Sheet for Healthcare Providers:  GravelBags.it This test is not yet ap proved or cleared by the [place] FDA and  has been authorized for detection and/or diagnosis of SARS-CoV-2 by FDA under an Emergency Use Authorization (EUA). This EUA will remain  in effect (meaning this test can be used) for the duration of the COVID-19 declaration under Section 564(b)(1) of the Act, 21 U.S.C. section 360bbb-3(b)(1), unless the authorization is terminated or revoked sooner.    Influenza A by PCR NEGATIVE NEGATIVE Final   Influenza B by PCR NEGATIVE NEGATIVE Final    Comment: (NOTE) The Xpert Xpress SARS-CoV-2/FLU/RSV assay is intended as an aid in  the diagnosis of influenza from Nasopharyngeal swab specimens and  should not be used as a sole basis for treatment. Nasal washings and  aspirates are unacceptable for Xpert Xpress SARS-CoV-2/FLU/RSV  testing. Fact Sheet for Patients: PinkCheek.be Fact Sheet for Healthcare Providers: GravelBags.it This test is not yet approved or cleared by the [place] FDA and  has been authorized for detection and/or diagnosis of SARS-CoV-2 by  FDA under an Emergency Use Authorization (EUA). This EUA will remain  in effect (meaning this test can be used) for the duration of the  Covid-19 declaration under Section 564(b)(1) of the Act, 21  U.S.C. section 360bbb-3(b)(1), unless the authorization is  terminated or revoked. Performed at [facility], [address] [place], [place] [address]   MRSA PCR Screening     Status: None   Collection Time: 06/27/19  9:50 PM   Specimen: Nasal Mucosa; Nasopharyngeal  Result Value Ref Range  Status   MRSA by PCR NEGATIVE NEGATIVE Final    Comment:        The GeneXpert MRSA Assay (FDA approved for NASAL specimens only), is one component of a comprehensive MRSA colonization surveillance program. It is not intended to diagnose MRSA infection nor to guide or monitor treatment for MRSA infections. Performed at [facility], [address] [place], [place] [address]      Scheduled Meds: . ALPRAZolam  1 mg Oral QHS  . aspirin EC  81 mg Oral Daily  . Chlorhexidine Gluconate Cloth  6 each Topical Daily  . enoxaparin (LOVENOX) injection  40 mg Subcutaneous Q24H  . escitalopram  20 mg Oral Daily  . feeding supplement (ENSURE ENLIVE)  237 mL Oral BID BM  . finasteride  5 mg Oral QHS  . fluticasone furoate-vilanterol  1 puff Inhalation Daily   And  . umeclidinium bromide  1 puff Inhalation Daily  . lisinopril  10 mg Oral QHS  . methylPREDNISolone (SOLU-MEDROL) injection  40 mg Intravenous Q6H   Followed by  . [START ON 06/29/2019] predniSONE  40 mg Oral Q breakfast  . pantoprazole  40 mg Oral Daily  . pravastatin  20 mg Oral QHS  . sodium chloride flush  3 mL Intravenous Q12H  . tamsulosin  0.4 mg Oral Daily  . cholecalciferol  1,000 Units Oral Daily   Continuous Infusions: . sodium chloride    . lactated ringers 75 mL/hr at 06/27/19 2212    Procedures/Studies: DG Chest Port 1 View  Result Date: 06/27/2019 CLINICAL DATA:  69 year old male with shortness of breath. EXAM: PORTABLE CHEST 1 VIEW COMPARISON:  Chest radiograph dated 09/04/2017 and CT dated 03/07/2018. FINDINGS: There is severe emphysema with severe bullous changes in the right lung base. No focal consolidation, pleural effusion, pneumothorax. The cardiac silhouette is within normal limits. There is prominence of the central pulmonary arteries suggestive of a degree of pulmonary hypertension. Clinical correlation is recommended. A 7 mm nodular density over the right mid lung field likely represents nipple shadow.  No acute osseous pathology. IMPRESSION: 1. No acute cardiopulmonary process. 2. Severe emphysema with severe bullous changes in the right lung base. Electronically Signed   By: Anner Crete M.D.   On: 06/27/2019 16:13    Orson Eva, DO  [facility]  If 7PM-7AM, please contact night-coverage [email or web address] Password [facility] 06/28/2019, 8:44 AM   LOS: 1 day

## 2019-06-28 NOTE — Progress Notes (Signed)
Patient incontinent of urine and stool. Bathed patient and changed bed linen. While cleaning patient up, he became very anxious. Relaxing technique recommended and patient did while finishing cleaning patient. Heart rate and respirations increased due to anxiety. Respiratory present and placed patient back on Bipap to assist with calming patient with breathing with good effect. Heart rate and respirations decreased and patient relaxed more. Dr Tat aware.

## 2019-06-29 DIAGNOSIS — R0603 Acute respiratory distress: Secondary | ICD-10-CM

## 2019-06-29 LAB — BASIC METABOLIC PANEL
Anion gap: 11 (ref 5–15)
BUN: 30 mg/dL — ABNORMAL HIGH (ref 8–23)
CO2: 29 mmol/L (ref 22–32)
Calcium: 9.1 mg/dL (ref 8.9–10.3)
Chloride: 95 mmol/L — ABNORMAL LOW (ref 98–111)
Creatinine, Ser: 0.71 mg/dL (ref 0.61–1.24)
GFR calc Af Amer: >60 mL/min (ref >60)
GFR calc non Af Amer: >60 mL/min (ref >60)
Glucose, Bld: 127 mg/dL — ABNORMAL HIGH (ref 70–99)
Potassium: 4.7 mmol/L (ref 3.5–5.1)
Sodium: 135 mmol/L (ref 135–145)

## 2019-06-29 LAB — CBC
HCT: 37.3 % — ABNORMAL LOW (ref 39.0–52.0)
Hemoglobin: 11.4 g/dL — ABNORMAL LOW (ref 13.0–17.0)
MCH: 26.9 pg (ref 26.0–34.0)
MCHC: 30.6 g/dL (ref 30.0–36.0)
MCV: 88 fL (ref 80.0–100.0)
Platelets: 319 10*3/uL (ref 150–400)
RBC: 4.24 MIL/uL (ref 4.22–5.81)
RDW: 15.2 % (ref 11.5–15.5)
WBC: 6.7 10*3/uL (ref 4.0–10.5)
nRBC: 0 % (ref 0.0–0.2)

## 2019-06-29 LAB — MAGNESIUM: Magnesium: 2.3 mg/dL (ref 1.7–2.4)

## 2019-06-29 MED ORDER — ALPRAZOLAM 0.5 MG PO TABS
1.0000 mg | ORAL_TABLET | Freq: Three times a day (TID) | ORAL | Status: DC
  Administered 2019-06-29 – 2019-07-03 (×13): 1 mg via ORAL
  Filled 2019-06-29 (×2): qty 2
  Filled 2019-06-29: qty 4
  Filled 2019-06-29 (×10): qty 2

## 2019-06-29 MED ORDER — HYDROCODONE-HOMATROPINE 5-1.5 MG/5ML PO SYRP
5.0000 mL | ORAL_SOLUTION | ORAL | Status: DC | PRN
  Administered 2019-06-29 – 2019-07-01 (×3): 5 mL via ORAL
  Filled 2019-06-29 (×3): qty 5

## 2019-06-29 MED ORDER — HYDRALAZINE HCL 20 MG/ML IJ SOLN
10.0000 mg | Freq: Four times a day (QID) | INTRAMUSCULAR | Status: DC | PRN
  Administered 2019-06-29: 10 mg via INTRAVENOUS
  Filled 2019-06-29: qty 1

## 2019-06-29 MED ORDER — BOOST / RESOURCE BREEZE PO LIQD CUSTOM
1.0000 | Freq: Three times a day (TID) | ORAL | Status: DC
  Administered 2019-06-30 – 2019-07-02 (×2): 1 via ORAL

## 2019-06-29 MED ORDER — METHYLPREDNISOLONE SODIUM SUCC 125 MG IJ SOLR
60.0000 mg | Freq: Two times a day (BID) | INTRAMUSCULAR | Status: DC
  Administered 2019-06-29 – 2019-07-01 (×5): 60 mg via INTRAVENOUS
  Filled 2019-06-29 (×5): qty 2

## 2019-06-29 MED ORDER — IPRATROPIUM-ALBUTEROL 0.5-2.5 (3) MG/3ML IN SOLN
3.0000 mL | Freq: Four times a day (QID) | RESPIRATORY_TRACT | Status: DC
  Administered 2019-06-29 – 2019-07-03 (×15): 3 mL via RESPIRATORY_TRACT
  Filled 2019-06-29 (×16): qty 3

## 2019-06-29 MED ORDER — ALBUTEROL SULFATE (2.5 MG/3ML) 0.083% IN NEBU
INHALATION_SOLUTION | RESPIRATORY_TRACT | Status: AC
  Administered 2019-06-29: 2.5 mg
  Filled 2019-06-29: qty 3

## 2019-06-29 MED ORDER — ORAL CARE MOUTH RINSE
15.0000 mL | Freq: Two times a day (BID) | OROMUCOSAL | Status: DC
  Administered 2019-06-29 – 2019-07-03 (×10): 15 mL via OROMUCOSAL

## 2019-06-29 MED ORDER — ALBUTEROL SULFATE (2.5 MG/3ML) 0.083% IN NEBU
2.5000 mg | INHALATION_SOLUTION | RESPIRATORY_TRACT | Status: DC | PRN
  Administered 2019-07-02 – 2019-07-03 (×3): 2.5 mg via RESPIRATORY_TRACT
  Filled 2019-06-29 (×3): qty 3

## 2019-06-29 MED ORDER — HALOPERIDOL LACTATE 5 MG/ML IJ SOLN
1.0000 mg | Freq: Once | INTRAMUSCULAR | Status: AC
  Administered 2019-06-29: 1 mg via INTRAVENOUS
  Filled 2019-06-29: qty 1

## 2019-06-29 NOTE — Progress Notes (Signed)
1445: Pt called for assistance, arrived to room to find pt sitting upright in orthopneic position, stating has been coughing and that made him SOB. (Please note, nurse just outside room at desk and has not heard any coughing by patient in last hour). Advised pt that he just had his nebulizer less than one hour ago and that his anxiety med was scheduled for 1600. Pt stated, "OK" and began to drink his coffee. MD Tat notified of pt's request for cough medicine.  1500: SaO2 down to 85%, HR 130 ST with occas PVC, resp 28/min. Pt sitting upright in bed, saying, "I can't breathe!" Skin warm and dry, color pink, no cyanosis, breath sounds clear but diminished in bases. Offered reassurance, led pt in purse-lipped breathing exercises and slowly he began to calm down. RT called to room to assess. Scheduled Xanax administered for anxiety.  1535: Pt sleeping at present. VSS. No cough noted.

## 2019-06-29 NOTE — Progress Notes (Addendum)
Pt has continuously been agitated and anxious all night. Pt was given nightly dose of Xanax at bedtime as well as IV dose of Ativan 0.5mg . Daytime MD was made aware that Ativan is pts allergy that causes hallucinations, but ordered anyway per daytime RN. Pts sheets and external catheter has been changed x4 this shift. Pt constantly pulling at bipap and trying to get OOB. Pt is being redirected every 30 minutes to stay in bed. PRN Xanax specifically states to give during the day- and IV Ativan possible made him more agitated/anxious.  Dr. Scherrie November paged and made aware of patients behavior and RN asked for something else for anxiety/agitation. Waiting for orders/call back. Will continue to monitor pt.   New order for Haldol 1mg  ONCE. Will give and continue to monitor pt

## 2019-06-29 NOTE — Plan of Care (Signed)

## 2019-06-29 NOTE — Progress Notes (Signed)
Pt calm, sleeping at present. Resp even, non-labored @ 18 - 20/min, SaO2 92%. HFNC O2 at 5lpm continues.

## 2019-06-29 NOTE — Progress Notes (Signed)
Patient is severely hyperinflated on right lung field. Left lung is mostly likely the only lung working as its only lung you will hear any air movement. Diaphragm is depressed , this causes patient to take short rapid respirations. When he does so he tires quickly. At which time his work of breathing increases and his anxiety increases. He basically is at the end of life stages. There is not much more can be done with out either intubation or continuous BiPAP.

## 2019-06-29 NOTE — Progress Notes (Signed)
Pt removed from bi-pap for am inhaler and breakfast. Pt c/o nausea, given Zofran 4mg  per order. Pt anxious, moaning, "Oh me!" over and over. Pt states, "I can't breathe!" but SaO2 91%, no cyanosis noted, pt able to talk in complete sentences. Instructed pt in slowing his respirations, deep breaths in through nose and out through mouth. This helps briefly. Offered pt reassurance that his breathing is being monitored and that by all observations, his respiratory status is stable. Pt attempting to take bites of breakfast, drinking sips of coffee without any noted swallowing issues.

## 2019-06-29 NOTE — Progress Notes (Signed)
1200: Pt ate 75% of lunch, tolerated well. Assisted back to bed by NT, very SOB with exertion and SaO2 down to 82% but recovered to 91% after 4 minutes of rest and purse lipped breathing. Lung sounds clear and diminished in the bases but air movement auscultated. Pt with congested cough, brought up small amount of thick, clear phlegm. Pt asking for, "medicine to help me breathe". Advised that next neb tx is not due yet but reassured pt that his breathing status is stable, his oxygen level is back to his normal and that he is doing well. Pt then talked with step-daughter on the phone without difficulty.  Currently, pt has dozed off, SaO2 95%, resp 18, heartrate 83/min. Skin warm and dry, no cyanosis noted.

## 2019-06-29 NOTE — Progress Notes (Addendum)
Pt sitting up in bed in tripod position in bed stating, "I need to get up." Pt is already on bipap per RT around 8pm. Pt keeps stating that he can't breathe. Pts O2 sats 97%, RR 28 on bipap @ 50% FIO2. Pt very anxious, keeps stating, "Oh me, Oh me." HR 120s, Xanax TID due @ 2200 given early d/t anxiety. Will continue to monitor pt.  Bipap nose piece changed to face mask per patient request.

## 2019-06-29 NOTE — Progress Notes (Signed)
Bath and bed linen change completed. Pt tolerated fair, very SOB with minimal exertion. Pt was able to stand and ambulate a few steps from bed to recliner with only standby assistance, but his SaO2 did drop into 84-85% range with exertion. Pt recovered to 91-92% with rest. Continued to encourage purse-lipped breathing, which does help control both his resp rate and his anxiety. Condom cath replaced after bath, pt voiding clear yellow urine. Pt remains OOB up in recliner, tolerating well. Call bell within reach, chair alarm on.

## 2019-06-29 NOTE — Progress Notes (Signed)
1340: Pt called for assistance due to bed wet. Arrived to room to find pt with condom cath off, bed pad wet with urine. Pt very anxious, states, "I can't breathe!". Noted pt's nasal cannula lying on bed linens, monitor reading SaO2 72%. Pt tachypneic at 32//min, HR up to 130. Nasal cannula replaced in pt's nares, O2 increased back to 5lpm HFNC. RT called to admin pt's scheduled 1400 neb tx. Encouraged pt in purse lipped breathing and self calming techniquies. SaO2 quickly back up to 85%, resp 28, HR 115. With pt calmer and with his permission, bed linens changed, pt cleaned. Condom cath replaced. RT in and neb tx started. 1350: SaO2 up to 94%, HR 105, resp 24/min. Neb tx continues, pt states, "This is so much better. Thank you!".

## 2019-06-29 NOTE — Progress Notes (Signed)
Pt ate 90% of supper meal, drinking coffee at present. Pt asks if he, "can have anything else to help his breathing". Pt's SaO2 95%, resp 20/min and non-labored. No cough noted post cough med administered. Skin warm and dry, no cyanosis. Asked pt why he needed medications for his breathing and he said, "Cause I can't breathe." Reminded pt that his vital signs were stable, his breathing was calm and even, he was able to eat his meal without difficulty or shortness of breath. Pt states, "Yea, but I just feel like I need something else." Reassured pt that he was doing very well and reminded him to use the purse-lipped breathing techniques to control his breathing. Pt stated, "OK, I guess you're right. I just want to get better and I don't want to feel like I did yesterday."  Advised pt of when his next nebulizer treatments, Xanax and cough medications were due. He stated understanding.

## 2019-06-29 NOTE — Progress Notes (Signed)
PROGRESS NOTE  Jay Mullins [id number] DOB: 1950/04/30 DOA: 06/27/2019 PCP: Chesley Noon, MD  Brief History:  69 year old male with a history of COPD, hypertension, chronic respiratory failure on 5 L, coronary artery disease, AAA, BPH, depression presenting with 5 to 6-day history of worsening shortness of breath and coughing.  He denies any fevers, chills, chest pain, nausea, vomiting, diarrhea, abdominal pain.  He denies any recent sick contacts.  His cough is nonproductive.  He denies any hemoptysis.  The patient quit smoking 3 years ago.  He has over 50-pack-year history of tobacco.  Notably, the patient states that he has been taking BC powders on a daily basis for the last several weeks. In the emergency department, the patient was afebrile hemodynamically stable.  He required BiPAP because of his respiratory distress.  BMP and CBC were essentially unremarkable.  UA was negative for pyuria.  Chest x-ray showed severe emphysema with bullous changes in the right lower lobe.  The patient was admitted for further treatment of his acute on chronic respiratory failure.  Assessment/Plan: Acute on chronic respiratory failure with hypoxia and hypercarbia -Secondary to COPD exacerbation -Patient requiring BiPAP ventilation intermittently -Wean oxygen back to 5 L as tolerated for saturation 88-92% -Personally reviewed EKG--sinus rhythm, nonspecific T wave changes -Personally reviewed chest x-ray--hyperinflation without any consolidation  COPD exacerbation -Start Pulmicort -Start duo nebs -Continue IV Solu-Medrol  Depression/anxiety -Patient does NOT have allergy to ativan despite documentation by 06/28/19-06/29/19 PM RN -pt received multiple doses ativan IV (due to risk of aspiration being on BiPAP) with good response on 06/28/19--no rashes, urticaria, angioedema nor anaphylactoid type response with IV ativan -PMP Aware queried--pt receives xanax 1 mg #90 monthly from one  provider Melford Aase, MD) -restart xanax this am as he remains stable off xanax  Essential hypertension -Holding lisinopril secondary to increasing potassium  Hyperlipidemia -Continue statin  Coronary artery disease -No chest pain presently -Continue aspirin  BPH -Continue tamsulosin and finasteride         Disposition Plan: Patient From: Home D/C Place: Home- 1-2  Days Barriers: Not Clinically Stable--requiring BiPAP intermittently  Family Communication:  No Family at bedside  Consultants:  none  Code Status:   DNR  DVT Prophylaxis:  [place] Lovenox   Procedures: As Listed in Progress Note Above  Antibiotics: None      Subjective: Pt overall breathing better.  Patient denies fevers, chills, headache, chest pain, dyspnea, nausea, vomiting, diarrhea, abdominal pain, dysuria, hematuria, hematochezia, and melena.   Objective: Vitals:   06/29/19 0739 06/29/19 0803 06/29/19 0839 06/29/19 0900  BP:  (!) 174/101 (!) 174/55 (!) 107/53  Pulse: 75 (!) 105 97 97  Resp: 15 (!) 29 (!) 22 20  Temp:      TempSrc:      SpO2: 96% 92% 94% 92%  Weight:      Height:        Intake/Output Summary (Last 24 hours) at 06/29/2019 0926 Last data filed at 06/29/2019 0350 Gross per 24 hour  Intake 2451.89 ml  Output 1400 ml  Net 1051.89 ml   Weight change: -5.632 kg Exam:   General:  Pt is alert, follows commands appropriately, not in acute distress  HEENT: No icterus, No thrush, No neck mass, [place]/AT  Cardiovascular: RRR, S1/S2, no rubs, no gallops  Respiratory: diminished BS bilateral. Bilateral rales.  Abdomen: Soft/+BS, non tender, non distended, no guarding  Extremities: No edema, No lymphangitis, No petechiae, No rashes,  no synovitis   Data Reviewed: I have personally reviewed following labs and imaging studies Basic Metabolic Panel: Recent Labs  Lab 06/27/19 1540 06/28/19 0418 06/29/19 0416  NA 135 133* 135  K 4.7 4.9 4.7  CL 94* 94*  95*  CO2 30 31 29   GLUCOSE 118* 133* 127*  BUN 19 21 30*  CREATININE 0.67 0.63 0.71  CALCIUM 9.2 8.9 9.1  MG  --   --  2.3   Liver Function Tests: No results for input(s): AST, ALT, ALKPHOS, BILITOT, PROT, ALBUMIN in the last 168 hours. No results for input(s): LIPASE, AMYLASE in the last 168 hours. No results for input(s): AMMONIA in the last 168 hours. Coagulation Profile: No results for input(s): INR, PROTIME in the last 168 hours. CBC: Recent Labs  Lab 06/27/19 1540 06/28/19 0418 06/29/19 0416  WBC 7.2 5.6 6.7  HGB 12.1* 10.9* 11.4*  HCT 40.5 35.7* 37.3*  MCV 89.2 86.7 88.0  PLT 301 318 319   Cardiac Enzymes: No results for input(s): CKTOTAL, CKMB, CKMBINDEX, TROPONINI in the last 168 hours. BNP: Invalid input(s): POCBNP CBG: No results for input(s): GLUCAP in the last 168 hours. HbA1C: No results for input(s): HGBA1C in the last 72 hours. Urine analysis:    Component Value Date/Time   COLORURINE YELLOW 06/27/2019 1622   APPEARANCEUR CLEAR 06/27/2019 1622   LABSPEC 1.015 06/27/2019 1622   PHURINE 7.0 06/27/2019 1622   GLUCOSEU NEGATIVE 06/27/2019 1622   HGBUR MODERATE (A) 06/27/2019 1622   BILIRUBINUR NEGATIVE 06/27/2019 1622   KETONESUR NEGATIVE 06/27/2019 1622   PROTEINUR NEGATIVE 06/27/2019 1622   UROBILINOGEN 1.0 12/13/2014 1248   NITRITE NEGATIVE 06/27/2019 1622   LEUKOCYTESUR NEGATIVE 06/27/2019 1622   Sepsis Labs: @LABRCNTIP (procalcitonin:4,lacticidven:4) ) Recent Results (from the past 240 hour(s))  Respiratory Panel by RT PCR (Flu A&B, Covid) - Nasopharyngeal Swab     Status: None   Collection Time: 06/27/19  7:09 PM   Specimen: Nasopharyngeal Swab  Result Value Ref Range Status   SARS Coronavirus 2 by RT PCR NEGATIVE NEGATIVE Final    Comment: (NOTE) SARS-CoV-2 target nucleic acids are NOT DETECTED. The SARS-CoV-2 RNA is generally detectable in upper respiratoy specimens during the acute phase of infection. The lowest concentration of  SARS-CoV-2 viral copies this assay can detect is 131 copies/mL. A negative result does not preclude SARS-Cov-2 infection and should not be used as the sole basis for treatment or other patient management decisions. A negative result may occur with  improper specimen collection/handling, submission of specimen other than nasopharyngeal swab, presence of viral mutation(s) within the areas targeted by this assay, and inadequate number of viral copies (<131 copies/mL). A negative result must be combined with clinical observations, patient history, and epidemiological information. The expected result is Negative. Fact Sheet for Patients:  PinkCheek.be Fact Sheet for Healthcare Providers:  GravelBags.it This test is not yet ap proved or cleared by the [place] FDA and  has been authorized for detection and/or diagnosis of SARS-CoV-2 by FDA under an Emergency Use Authorization (EUA). This EUA will remain  in effect (meaning this test can be used) for the duration of the COVID-19 declaration under Section 564(b)(1) of the Act, 21 U.S.C. section 360bbb-3(b)(1), unless the authorization is terminated or revoked sooner.    Influenza A by PCR NEGATIVE NEGATIVE Final   Influenza B by PCR NEGATIVE NEGATIVE Final    Comment: (NOTE) The Xpert Xpress SARS-CoV-2/FLU/RSV assay is intended as an aid in  the diagnosis of influenza from  Nasopharyngeal swab specimens and  should not be used as a sole basis for treatment. Nasal washings and  aspirates are unacceptable for Xpert Xpress SARS-CoV-2/FLU/RSV  testing. Fact Sheet for Patients: PinkCheek.be Fact Sheet for Healthcare Providers: GravelBags.it This test is not yet approved or cleared by the [place] FDA and  has been authorized for detection and/or diagnosis of SARS-CoV-2 by  FDA under an Emergency Use Authorization (EUA).  This EUA will remain  in effect (meaning this test can be used) for the duration of the  Covid-19 declaration under Section 564(b)(1) of the Act, 21  U.S.C. section 360bbb-3(b)(1), unless the authorization is  terminated or revoked. Performed at [facility], [address], [place], [place] [address]   MRSA PCR Screening     Status: None   Collection Time: 06/27/19  9:50 PM   Specimen: Nasal Mucosa; Nasopharyngeal  Result Value Ref Range Status   MRSA by PCR NEGATIVE NEGATIVE Final    Comment:        The GeneXpert MRSA Assay (FDA approved for NASAL specimens only), is one component of a comprehensive MRSA colonization surveillance program. It is not intended to diagnose MRSA infection nor to guide or monitor treatment for MRSA infections. Performed at [facility], [address] [place], [place] [address]      Scheduled Meds: . ALPRAZolam  1 mg Oral QHS  . aspirin EC  81 mg Oral Daily  . budesonide (PULMICORT) nebulizer solution  0.5 mg Nebulization BID  . Chlorhexidine Gluconate Cloth  6 each Topical Daily  . enoxaparin (LOVENOX) injection  40 mg Subcutaneous Q24H  . escitalopram  20 mg Oral Daily  . feeding supplement (ENSURE ENLIVE)  237 mL Oral BID BM  . finasteride  5 mg Oral QHS  . mouth rinse  15 mL Mouth Rinse BID  . methylPREDNISolone (SOLU-MEDROL) injection  60 mg Intravenous Q6H  . pantoprazole  40 mg Oral Daily  . pravastatin  20 mg Oral QHS  . sodium chloride flush  3 mL Intravenous Q12H  . tamsulosin  0.4 mg Oral Daily  . umeclidinium bromide  1 puff Inhalation Daily  . cholecalciferol  1,000 Units Oral Daily   Continuous Infusions: . sodium chloride    . lactated ringers 75 mL/hr at 06/28/19 2156    Procedures/Studies: DG Chest Port 1 View  Result Date: 06/27/2019 CLINICAL DATA:  69 year old male with shortness of breath. EXAM: PORTABLE CHEST 1 VIEW COMPARISON:  Chest radiograph dated 09/04/2017 and CT dated 03/07/2018. FINDINGS: There is severe  emphysema with severe bullous changes in the right lung base. No focal consolidation, pleural effusion, pneumothorax. The cardiac silhouette is within normal limits. There is prominence of the central pulmonary arteries suggestive of a degree of pulmonary hypertension. Clinical correlation is recommended. A 7 mm nodular density over the right mid lung field likely represents nipple shadow. No acute osseous pathology. IMPRESSION: 1. No acute cardiopulmonary process. 2. Severe emphysema with severe bullous changes in the right lung base. Electronically Signed   By: Anner Crete M.D.   On: 06/27/2019 16:13    Orson Eva, DO  [facility]  If 7PM-7AM, please contact night-coverage [email or web address] Password [facility] 06/29/2019, 9:26 AM   LOS: 2 days

## 2019-06-29 NOTE — Progress Notes (Signed)
Initial Nutrition Assessment  DOCUMENTATION CODES:   Not applicable  INTERVENTION:  Provide Boost Breeze po TID, each supplement provides 250 kcal and 9 grams of protein  Encourage adequate PO intake.   NUTRITION DIAGNOSIS:   Increased nutrient needs related to chronic illness(COPD) as evidenced by estimated needs.  GOAL:   Patient will meet greater than or equal to 90% of their needs  MONITOR:   PO intake, Supplement acceptance, Skin, Weight trends, Labs, I & O's  REASON FOR ASSESSMENT:   Malnutrition Screening Tool    ASSESSMENT:   69 year old male with a history of COPD, hypertension, coronary artery disease presenting with worsening shortness of breath and coughing. Chest x-ray showed severe emphysema with bullous changes in the right lower lobe. Pt with acute on chronic respiratory failure secondary to COPD exacerbation  RD working remotely.  Pt unavailable during attempted time of contact. RD unable to obtain pt nutrition history at this time. Meal completion has been 75-90%. Pt currently has Ensure ordered, however has been refusing them. RD to order Boost Breeze instead to aid in increased caloric and protein needs.   Unable to complete Nutrition-Focused physical exam at this time.   Labs and medications reviewed.   Diet Order:   Diet Order            Diet regular Room service appropriate? Yes; Fluid consistency: Thin  Diet effective now              EDUCATION NEEDS:   Not appropriate for education at this time  Skin:  Skin Assessment: Reviewed RN Assessment  Last BM:  4/24  Height:   Ht Readings from Last 1 Encounters:  06/27/19 5\' 7"  (1.702 m)    Weight:   Wt Readings from Last 1 Encounters:  06/29/19 61.5 kg    BMI:  Body mass index is 21.24 kg/m.  Estimated Nutritional Needs:   Kcal:  1850-2050  Protein:  90-100 grams  Fluid:  >/= 1.8 L/day   Corrin Parker, MS, RD, LDN RD pager number/after hours weekend pager number on  Amion.

## 2019-06-30 ENCOUNTER — Inpatient Hospital Stay (HOSPITAL_COMMUNITY): Payer: Medicare Other

## 2019-06-30 DIAGNOSIS — J441 Chronic obstructive pulmonary disease with (acute) exacerbation: Principal | ICD-10-CM

## 2019-06-30 DIAGNOSIS — J9622 Acute and chronic respiratory failure with hypercapnia: Secondary | ICD-10-CM

## 2019-06-30 DIAGNOSIS — J9621 Acute and chronic respiratory failure with hypoxia: Secondary | ICD-10-CM

## 2019-06-30 DIAGNOSIS — F419 Anxiety disorder, unspecified: Secondary | ICD-10-CM

## 2019-06-30 LAB — CBC
HCT: 36 % — ABNORMAL LOW (ref 39.0–52.0)
Hemoglobin: 11 g/dL — ABNORMAL LOW (ref 13.0–17.0)
MCH: 26.7 pg (ref 26.0–34.0)
MCHC: 30.6 g/dL (ref 30.0–36.0)
MCV: 87.4 fL (ref 80.0–100.0)
Platelets: 342 10*3/uL (ref 150–400)
RBC: 4.12 MIL/uL — ABNORMAL LOW (ref 4.22–5.81)
RDW: 15 % (ref 11.5–15.5)
WBC: 10.3 10*3/uL (ref 4.0–10.5)
nRBC: 0 % (ref 0.0–0.2)

## 2019-06-30 LAB — BASIC METABOLIC PANEL
Anion gap: 10 (ref 5–15)
BUN: 37 mg/dL — ABNORMAL HIGH (ref 8–23)
CO2: 31 mmol/L (ref 22–32)
Calcium: 9 mg/dL (ref 8.9–10.3)
Chloride: 94 mmol/L — ABNORMAL LOW (ref 98–111)
Creatinine, Ser: 0.78 mg/dL (ref 0.61–1.24)
GFR calc Af Amer: >60 mL/min (ref >60)
GFR calc non Af Amer: >60 mL/min (ref >60)
Glucose, Bld: 121 mg/dL — ABNORMAL HIGH (ref 70–99)
Potassium: 4.5 mmol/L (ref 3.5–5.1)
Sodium: 135 mmol/L (ref 135–145)

## 2019-06-30 LAB — MAGNESIUM: Magnesium: 2.3 mg/dL (ref 1.7–2.4)

## 2019-06-30 MED ORDER — ARFORMOTEROL TARTRATE 15 MCG/2ML IN NEBU
15.0000 ug | INHALATION_SOLUTION | Freq: Two times a day (BID) | RESPIRATORY_TRACT | Status: DC
  Administered 2019-06-30 – 2019-07-03 (×7): 15 ug via RESPIRATORY_TRACT
  Filled 2019-06-30 (×7): qty 2

## 2019-06-30 NOTE — Progress Notes (Signed)
After patient received Ativan @ 2215 pt resting quietly in bed. HR WNL @ 80- O2 sats 99% on bipap and BP 124/76. No more complaints of shortness of breath at this time. Will continue to monitor pt.

## 2019-06-30 NOTE — Progress Notes (Addendum)
PROGRESS NOTE  Jay Mullins [id number] DOB: 01/19/1951 DOA: 06/27/2019 PCP: Chesley Noon, MD  Brief History: 69 year old male with a history of COPD, hypertension, chronic respiratory failure on 5 L, coronary artery disease, AAA, BPH, depression presenting with 5 to 6-day history of worsening shortness of breath and coughing. He denies any fevers, chills, chest pain, nausea, vomiting, diarrhea, abdominal pain. He denies any recent sick contacts. His cough is nonproductive. He denies any hemoptysis. The patient quit smoking 3 years ago. He has over 50-pack-year history of tobacco. Notably, the patient states that he has been taking BC powders on a daily basis for the last several weeks. In the emergency department, the patient was afebrile hemodynamically stable. He required BiPAP because of his respiratory distress. BMP and CBC were essentially unremarkable. UA was negative for pyuria. Chest x-ray showed severe emphysema with bullous changes in the right lower lobe. The patient was admitted for further treatment of his acute on chronic respiratory failure.  Assessment/Plan: Acute on chronic respiratory failure with hypoxia and hypercarbia -Secondary to COPD exacerbation -Patient requiring BiPAP ventilation more frequently now -partly driven by anxiety -Wean oxygen back to 5 L as tolerated for saturation 88-92% -Personally reviewed EKG--sinus rhythm, nonspecific T wave changes -repeat CXR -wean for saturation 88-90% -consult pulmonary  COPD exacerbation -Start Pulmicort -Start duo nebs -Continue IV Solu-Medrol -add [place]  Depression/anxiety -Patient does NOT have allergy to ativan despite documentation by 06/28/19-06/29/19 PM RN -pt received multiple doses ativan IV (due to risk of aspiration being on BiPAP) with good response on 06/28/19--no rashes, urticaria, angioedema nor anaphylactoid type response with IV ativan -PMP Aware queried--pt receives  xanax 1 mg #90 monthly from one provider Melford Aase, MD) -increase xanax to q 6 hrs -continue IV ativan for breakthrough anxiety  Essential hypertension -Holding lisinopril secondary to increasing potassium -BP stable  Hyperlipidemia -Continue statin  Coronary artery disease -No chest pain presently -Continue aspirin  BPH -Continue tamsulosin and finasteride  Goals of care -DNR confirmed with family Advance care planning, including the explanation and discussion of advance directives was carried out with the patient and family.  Code status including explanations of "Full Code" and "DNR" and alternatives were discussed in detail.  Discussion of end-of-life issues including but not limited palliative care, hospice care and the concept of hospice, other end-of-life care options, power of attorney for health care decisions, living wills, and physician orders for life-sustaining treatment were also discussed with the patient and family.  Total face to face time 16 minutes.      Total time spent 35 minutes.  Greater than 50% spent face to face counseling and coordinating care.     Disposition Plan: Patient From: Home D/C Place: Home with hospice vs residential hospice Barriers: Not Clinically Stable--requiring BiPAP more frequently now  Family Communication:spouse and son updated at bedside 4/26  Consultants:pulmonary, palliative medicine  Code Status: DNR  DVT Prophylaxis: [place] Lovenox   Procedures: As Listed in Progress Note Above  Antibiotics: None    Subjective: Pt continues to have sob with minimal exertion.  Anxiety making it worse.  Denied cp, n/v/d, abd pain, f/c  Objective: Vitals:   06/30/19 0953 06/30/19 1000 06/30/19 1100 06/30/19 1204  BP: (!) 145/90 (!) 151/91 (!) 104/57   Pulse: (!) 111 98 95   Resp: (!) 29 (!) 30 (!) 21   Temp:    98 F (36.7 C)  TempSrc:    Axillary  SpO2:  93% 94% 94%   Weight:      Height:         Intake/Output Summary (Last 24 hours) at 06/30/2019 1205 Last data filed at 06/29/2019 2007 Gross per 24 hour  Intake 363 ml  Output 1000 ml  Net -637 ml   Weight change:  Exam:   General:  Pt is alert, follows commands appropriately, not in acute distress  HEENT: No icterus, No thrush, No neck mass, [place]/AT  Cardiovascular: RRR, S1/S2, no rubs, no gallops  Respiratory: bibasilar wheeze.  Diminished BS.  Scattered rales bilateral  Abdomen: Soft/+BS, non tender, non distended, no guarding  Extremities: No edema, No lymphangitis, No petechiae, No rashes, no synovitis   Data Reviewed: I have personally reviewed following labs and imaging studies Basic Metabolic Panel: Recent Labs  Lab 06/27/19 1540 06/28/19 0418 06/29/19 0416 06/30/19 0347  NA 135 133* 135 135  K 4.7 4.9 4.7 4.5  CL 94* 94* 95* 94*  CO2 30 31 29 31   GLUCOSE 118* 133* 127* 121*  BUN 19 21 30* 37*  CREATININE 0.67 0.63 0.71 0.78  CALCIUM 9.2 8.9 9.1 9.0  MG  --   --  2.3 2.3   Liver Function Tests: No results for input(s): AST, ALT, ALKPHOS, BILITOT, PROT, ALBUMIN in the last 168 hours. No results for input(s): LIPASE, AMYLASE in the last 168 hours. No results for input(s): AMMONIA in the last 168 hours. Coagulation Profile: No results for input(s): INR, PROTIME in the last 168 hours. CBC: Recent Labs  Lab 06/27/19 1540 06/28/19 0418 06/29/19 0416 06/30/19 0347  WBC 7.2 5.6 6.7 10.3  HGB 12.1* 10.9* 11.4* 11.0*  HCT 40.5 35.7* 37.3* 36.0*  MCV 89.2 86.7 88.0 87.4  PLT 301 318 319 342   Cardiac Enzymes: No results for input(s): CKTOTAL, CKMB, CKMBINDEX, TROPONINI in the last 168 hours. BNP: Invalid input(s): POCBNP CBG: No results for input(s): GLUCAP in the last 168 hours. HbA1C: No results for input(s): HGBA1C in the last 72 hours. Urine analysis:    Component Value Date/Time   COLORURINE YELLOW 06/27/2019 1622   APPEARANCEUR CLEAR 06/27/2019 1622   LABSPEC 1.015 06/27/2019  1622   PHURINE 7.0 06/27/2019 1622   GLUCOSEU NEGATIVE 06/27/2019 1622   HGBUR MODERATE (A) 06/27/2019 1622   BILIRUBINUR NEGATIVE 06/27/2019 1622   KETONESUR NEGATIVE 06/27/2019 1622   PROTEINUR NEGATIVE 06/27/2019 1622   UROBILINOGEN 1.0 12/13/2014 1248   NITRITE NEGATIVE 06/27/2019 1622   LEUKOCYTESUR NEGATIVE 06/27/2019 1622   Sepsis Labs: @LABRCNTIP (procalcitonin:4,lacticidven:4) ) Recent Results (from the past 240 hour(s))  Respiratory Panel by RT PCR (Flu A&B, Covid) - Nasopharyngeal Swab     Status: None   Collection Time: 06/27/19  7:09 PM   Specimen: Nasopharyngeal Swab  Result Value Ref Range Status   SARS Coronavirus 2 by RT PCR NEGATIVE NEGATIVE Final    Comment: (NOTE) SARS-CoV-2 target nucleic acids are NOT DETECTED. The SARS-CoV-2 RNA is generally detectable in upper respiratoy specimens during the acute phase of infection. The lowest concentration of SARS-CoV-2 viral copies this assay can detect is 131 copies/mL. A negative result does not preclude SARS-Cov-2 infection and should not be used as the sole basis for treatment or other patient management decisions. A negative result may occur with  improper specimen collection/handling, submission of specimen other than nasopharyngeal swab, presence of viral mutation(s) within the areas targeted by this assay, and inadequate number of viral copies (<131 copies/mL). A negative result must be combined with clinical observations,  patient history, and epidemiological information. The expected result is Negative. Fact Sheet for Patients:  PinkCheek.be Fact Sheet for Healthcare Providers:  GravelBags.it This test is not yet ap proved or cleared by the [place] FDA and  has been authorized for detection and/or diagnosis of SARS-CoV-2 by FDA under an Emergency Use Authorization (EUA). This EUA will remain  in effect (meaning this test can be used) for the  duration of the COVID-19 declaration under Section 564(b)(1) of the Act, 21 U.S.C. section 360bbb-3(b)(1), unless the authorization is terminated or revoked sooner.    Influenza A by PCR NEGATIVE NEGATIVE Final   Influenza B by PCR NEGATIVE NEGATIVE Final    Comment: (NOTE) The Xpert Xpress SARS-CoV-2/FLU/RSV assay is intended as an aid in  the diagnosis of influenza from Nasopharyngeal swab specimens and  should not be used as a sole basis for treatment. Nasal washings and  aspirates are unacceptable for Xpert Xpress SARS-CoV-2/FLU/RSV  testing. Fact Sheet for Patients: PinkCheek.be Fact Sheet for Healthcare Providers: GravelBags.it This test is not yet approved or cleared by the [place] FDA and  has been authorized for detection and/or diagnosis of SARS-CoV-2 by  FDA under an Emergency Use Authorization (EUA). This EUA will remain  in effect (meaning this test can be used) for the duration of the  Covid-19 declaration under Section 564(b)(1) of the Act, 21  U.S.C. section 360bbb-3(b)(1), unless the authorization is  terminated or revoked. Performed at [facility], [address] [place], [place] [address]   MRSA PCR Screening     Status: None   Collection Time: 06/27/19  9:50 PM   Specimen: Nasal Mucosa; Nasopharyngeal  Result Value Ref Range Status   MRSA by PCR NEGATIVE NEGATIVE Final    Comment:        The GeneXpert MRSA Assay (FDA approved for NASAL specimens only), is one component of a comprehensive MRSA colonization surveillance program. It is not intended to diagnose MRSA infection nor to guide or monitor treatment for MRSA infections. Performed at [facility], [address], [place], [place] [address]      Scheduled Meds: . ALPRAZolam  1 mg Oral TID  . arformoterol  15 mcg Nebulization BID  . aspirin EC  81 mg Oral Daily  . budesonide (PULMICORT) nebulizer solution  0.5 mg Nebulization BID  .  Chlorhexidine Gluconate Cloth  6 each Topical Daily  . enoxaparin (LOVENOX) injection  40 mg Subcutaneous Q24H  . escitalopram  20 mg Oral Daily  . feeding supplement  1 Container Oral TID BM  . finasteride  5 mg Oral QHS  . ipratropium-albuterol  3 mL Nebulization Q6H  . mouth rinse  15 mL Mouth Rinse BID  . methylPREDNISolone (SOLU-MEDROL) injection  60 mg Intravenous Q12H  . pantoprazole  40 mg Oral Daily  . pravastatin  20 mg Oral QHS  . sodium chloride flush  3 mL Intravenous Q12H  . tamsulosin  0.4 mg Oral Daily  . cholecalciferol  1,000 Units Oral Daily   Continuous Infusions: . sodium chloride      Procedures/Studies: DG Chest Port 1 View  Result Date: 06/27/2019 CLINICAL DATA:  69 year old male with shortness of breath. EXAM: PORTABLE CHEST 1 VIEW COMPARISON:  Chest radiograph dated 09/04/2017 and CT dated 03/07/2018. FINDINGS: There is severe emphysema with severe bullous changes in the right lung base. No focal consolidation, pleural effusion, pneumothorax. The cardiac silhouette is within normal limits. There is prominence of the central pulmonary arteries suggestive of a  degree of pulmonary hypertension. Clinical correlation is recommended. A 7 mm nodular density over the right mid lung field likely represents nipple shadow. No acute osseous pathology. IMPRESSION: 1. No acute cardiopulmonary process. 2. Severe emphysema with severe bullous changes in the right lung base. Electronically Signed   By: Anner Crete M.D.   On: 06/27/2019 16:13    Orson Eva, DO  [facility]  If 7PM-7AM, please contact night-coverage [email or web address] Password [email or web address] 06/30/2019, 12:05 PM   LOS: 3 days

## 2019-06-30 NOTE — Consult Note (Signed)
NAME:  Jay Mullins, MRN:  [id number], DOB:  1951/03/01, LOS: 3 ADMISSION DATE:  06/27/2019, CONSULTATION DATE:  06/30/2019  REFERRING MD:  Ria Comment MD, CHIEF COMPLAINT: Respiratory distress  Brief History   69 year old man with advanced COPD on 4 to 5 L oxygen admitted with respiratory distress and acute hypercarbic respiratory failure.  Remains BiPAP dependent hence PCCM is to consult  History of present illness   69 year old ex-smoker with COPD on 4 to 5 L of oxygen for chronic hypoxic respiratory failure admitted with shortness of breath and coughing, placed on BiPAP Chest x-ray personally reviewed which showed extensive bullous disease of the lower right lung with mediastinal shift to the left ABG was 7.4 0/50/74 He has remained BiPAP dependent and hence we are asked to consult Per nursing, came off BiPAP this morning for about an hour and then developed hypoxia and severe anxiety Wife reports that he has severe anxiety and Xanax seem to help over the past few months  I have reviewed his recent pulmonology evaluation with [facility] COPD, bullous emphysema, 4 to 5 L of oxygen at baseline, recent screening CT noted to have left lower lobe and left lower lobe pulmonary nodules measuring up to 1.4 cm with no hypermetabolic activity on PET He smoked more than 40 pack years before quitting in 2018 He was advised to continue Trelegy, palliative versus hospice care to help with as needed morphine for dyspnea  Past Medical History  AAA  Significant Hospital Events     Consults:    Procedures:    Significant Diagnostic Tests:  CT chest 05/2019  >> Small amount of layering debris within the trachea. No pleural effusion or pneumothorax. There is severe emphysema with bullous replacement of nearly the entire right middle lobe. There is atelectasis in the medial aspect of the right lower lobe. There is also consolidation in the medial aspect of the right lower lobe.   CT chest 03/2018  -bullous changes of right lower lung, nodular thickening at right base unchanged compared to 2019  Micro Data:    Antimicrobials:     Interim history/subjective:  Complains of shortness of breath Wife at bedside  Objective   Blood pressure 112/76, pulse 94, temperature 98 F (36.7 C), temperature source Axillary, resp. rate (!) 21, height 5\' 7"  (1.702 m), weight 61.5 kg, SpO2 92 %.    FiO2 (%):  [40 %-50 %] 40 %   Intake/Output Summary (Last 24 hours) at 06/30/2019 1348 Last data filed at 06/29/2019 2007 Gross per 24 hour  Intake 360 ml  Output 1000 ml  Net -640 ml   Filed Weights   06/27/19 2148 06/28/19 0400 06/29/19 0500  Weight: 62.1 kg 62.1 kg 61.5 kg    Examination: General: Elderly man, mild distress, on BiPAP full facemask HENT: Mild pallor, no icterus, no JVD Lungs: Decreased breath sounds on right, ventilated sounds on left, no rhonchi Cardiovascular: S1-S2 regular Abdomen: Soft nontender abdomen Extremities: No edema Neuro: Alert, interactive, no asterixis   Chest x-ray personally reviewed which shows bullous changes of the right lung, no clear infiltrate or effusion  Resolved Hospital Problem list     Assessment & Plan:  Acute on chronic hypoxic/hypercarbic respiratory failure Treating as COPD exacerbation , no other cause identified Severe anxiety Underlying end-stage bullous emphysema  Plan -We took him off BiPAP, I educated him on pursed lip breathing and he seems to be tolerating this currently about 6 to 7 L nasal cannula -Agree with reinstituting his Xanax  and adding low-dose Ativan for breakthrough anxiety -If this does not work we can consider adding low-dose morphine for chronic dyspnea  -Goal would be to prolong his time off BiPAP and hopefully reach a goal of nocturnal BiPAP -Continue current regimen of Pulmicort/Brovana/DuoNebs -Continue IV Solu-Medrol 60 every 12  Wife updated at bedside   Labs   CBC: Recent Labs  Lab  06/27/19 1540 06/28/19 0418 06/29/19 0416 06/30/19 0347  WBC 7.2 5.6 6.7 10.3  HGB 12.1* 10.9* 11.4* 11.0*  HCT 40.5 35.7* 37.3* 36.0*  MCV 89.2 86.7 88.0 87.4  PLT 301 318 319 [id number]    Basic Metabolic Panel: Recent Labs  Lab 06/27/19 1540 06/28/19 0418 06/29/19 0416 06/30/19 0347  NA 135 133* 135 135  K 4.7 4.9 4.7 4.5  CL 94* 94* 95* 94*  CO2 30 31 29 31   GLUCOSE 118* 133* 127* 121*  BUN 19 21 30* 37*  CREATININE 0.67 0.63 0.71 0.78  CALCIUM 9.2 8.9 9.1 9.0  MG  --   --  2.3 2.3   GFR: Estimated Creatinine Clearance: 76.9 mL/min (by C-G formula based on SCr of 0.78 mg/dL). Recent Labs  Lab 06/27/19 1540 06/27/19 1803 06/28/19 0418 06/29/19 0416 06/30/19 0347  WBC 7.2  --  5.6 6.7 10.3  LATICACIDVEN 1.1 1.1  --   --   --     Liver Function Tests: No results for input(s): AST, ALT, ALKPHOS, BILITOT, PROT, ALBUMIN in the last 168 hours. No results for input(s): LIPASE, AMYLASE in the last 168 hours. No results for input(s): AMMONIA in the last 168 hours.  ABG    Component Value Date/Time   PHART 7.405 06/27/2019 2310   PCO2ART 49.9 (H) 06/27/2019 2310   PO2ART 74.0 (L) 06/27/2019 2310   HCO3 29.2 (H) 06/27/2019 2310   TCO2 30 11/18/2016 1522   O2SAT 93.9 06/27/2019 2310     Coagulation Profile: No results for input(s): INR, PROTIME in the last 168 hours.  Cardiac Enzymes: No results for input(s): CKTOTAL, CKMB, CKMBINDEX, TROPONINI in the last 168 hours.  HbA1C: No results found for: HGBA1C  CBG: No results for input(s): GLUCAP in the last 168 hours.  Review of Systems:   Unable to obtain since on BiPAP No pedal edema, orthopnea or proximal nocturnal dyspnea No chest pain or palpitations No abdominal pain or distention  Past Medical History  He,  has a past medical history of AAA (abdominal aortic aneurysm) ([place]), Aortic aneurysm ([place]), Arthritis, Benign prostatic hyperplasia, COPD (chronic obstructive pulmonary disease) ([place]), Depression,  Dyspnea, GERD (gastroesophageal reflux disease), Headache, Hypertension, Myocardial infarction ([place]) (1980's), On home oxygen therapy, Panlobular emphysema ([place]), Pneumonia, and Prostate disorder.   Surgical History    Past Surgical History:  Procedure Laterality Date  . ABDOMINAL AORTIC ENDOVASCULAR STENT GRAFT N/A 07/27/2016   Procedure: ABDOMINAL AORTIC ENDOVASCULAR STENT GRAFT;  Surgeon: Angelia Mould, MD;  Location: [place];  Service: Vascular;  Laterality: N/A;  . BRAIN SURGERY  Jul 14, 2013   Mass front Left side  . CARDIAC CATHETERIZATION  1980's  . ENDOSCOPIC TRANS NASAL APPROACH WITH FUSION N/A 01/29/2017   Procedure: ENDOSCOPIC TRANS NASAL APPROACH AND TRANS ORAL APPROACH WITH EXCISION PAPALOM;  Surgeon: Melida Quitter, MD;  Location: [place];  Service: ENT;  Laterality: N/A;  . LAPAROTOMY  1970's   ulcer ruptured     Social History   reports that he quit smoking about 3 years ago. His smoking use included cigarettes. He has a 40.00  pack-year smoking history. He has quit using smokeless tobacco.  His smokeless tobacco use included snuff and chew. He reports that he does not drink alcohol or use drugs.   Family History   His family history includes COPD in his sister.   Allergies Allergies  Allergen Reactions  . Lorazepam Other (See Comments)    hallucinations 06/28/19--tolerated IV ativan--without any rash, hives, angioedema or any anaphlylactoid response     Home Medications  Prior to Admission medications   Medication Sig Start Date End Date Taking? Authorizing Provider  albuterol (PROVENTIL HFA;VENTOLIN HFA) 108 (90 Base) MCG/ACT inhaler Inhale 2 puffs into the lungs every 6 (six) hours as needed for wheezing or shortness of breath.   Yes [provider]  ALPRAZolam Duanne Moron) 1 MG tablet Take 1-2 mg at bedtime as needed by mouth for anxiety (sleep). Take 1 tablet (1 mg) by mouth daily at bedtime, may also take 1/2-1 tablet (0.5 - 1 mg) during the day as  needed for anxiety    Yes [provider]  aspirin EC 81 MG tablet Take 81 mg by mouth daily.   Yes [provider]  Aspirin-Salicylamide-Caffeine (BC HEADACHE) 325-95-16 MG TABS Take 1 packet by mouth once as needed (FOR PAIN).   Yes [provider]  budesonide-formoterol (SYMBICORT) 160-4.5 MCG/ACT inhaler Inhale 1 puff into the lungs 2 (two) times daily.    Yes [provider]  Cholecalciferol (D 1000) 1000 units capsule Take 1,000 Units by mouth daily.    Yes [provider]  colchicine 0.6 MG tablet Take 0.6 mg by mouth every 6 (six) hours as needed (gout flares). Mitigare   Yes [provider]  escitalopram (LEXAPRO) 20 MG tablet Take 20 mg by mouth daily.   Yes [provider]  finasteride (PROSCAR) 5 MG tablet Take 5 mg by mouth at bedtime.  10/17/15  Yes [provider]  Fluticasone-Umeclidin-Vilant (TRELEGY ELLIPTA) 200-62.5-25 MCG/INH AEPB Inhale into the lungs. 05/06/19  Yes [provider]  ipratropium-albuterol (DUONEB) 0.5-2.5 (3) MG/3ML SOLN Inhale 3 mLs into the lungs every 6 (six) hours as needed (shortness of breath/wheezing).  11/26/14  Yes [provider]  lisinopril (PRINIVIL,ZESTRIL) 10 MG tablet Take 10 mg by mouth at bedtime.   Yes [provider]  OXYGEN Inhale 5 L into the lungs continuous.    Yes [provider]  pantoprazole (PROTONIX) 40 MG tablet Take 40 mg by mouth daily. Received from [facility]   Yes [provider]  pravastatin (PRAVACHOL) 20 MG tablet TAKE 1 TABLET BY MOUTH AT BEDTIME 11/12/18  Yes Fay Records, MD  tamsulosin (FLOMAX) 0.4 MG CAPS capsule Take 0.4 mg by mouth daily. Received from [facility]    Yes [provider]  Tiotropium Bromide Monohydrate (SPIRIVA RESPIMAT) 2.5 MCG/ACT AERS Inhale 1 puff into the lungs daily.   Yes [provider]  butalbital-acetaminophen-caffeine (FIORICET, ESGIC) 50-325-40 MG tablet Take 2  tablets every 4 (four) hours as needed by mouth for headache.     [provider]     Kara Mead MD. FCCP. [facility]  If no response to pager , please call [phone]   06/30/2019

## 2019-06-30 NOTE — Progress Notes (Signed)
Pt continuously stating that he can not breathe. Pt sitting in tripod position and trying to get OOB stating he needs to get up. RN reminded him of pursed lip breathing and deep breaths in and out. Pt asks, "why is this happening. Why can't I breathe." RN reminded patient that he has emphysema and that it will continue to be hard for him to breathe. RN advised patient that he is talking in complete sentences and that his 02 sats are 97% on bipap @ 35% FIO2- HR is increased to 120's, and RR 25.  Pt advised that he is anxious and Ativan will be given per PRN order.

## 2019-06-30 NOTE — Care Management Important Message (Signed)
Important Message  Patient Details  Name: Jay Mullins MRN: [id number] Date of Birth: July 02, 1950   Medicare Important Message Given:  Yes     Tommy Medal 06/30/2019, 4:32 PM

## 2019-06-30 NOTE — Progress Notes (Signed)
PT Cancellation Note  Patient Details Name: Jay Mullins MRN: [id number] DOB: [birth year]   Cancelled Treatment:    Reason Eval/Treat Not Completed: Medical issues which prohibited therapy.  Patient on BiPAP and having difficulty breathing - therapy held, will check back tomorrow.   3:00 PM, 06/30/19 Lonell Grandchild, MPT Physical Therapist with [facility] [phone] office [phone] mobile phone

## 2019-07-01 ENCOUNTER — Encounter (HOSPITAL_COMMUNITY): Payer: Self-pay | Admitting: Internal Medicine

## 2019-07-01 DIAGNOSIS — Z515 Encounter for palliative care: Secondary | ICD-10-CM

## 2019-07-01 DIAGNOSIS — Z7189 Other specified counseling: Secondary | ICD-10-CM

## 2019-07-01 LAB — BASIC METABOLIC PANEL
Anion gap: 10 (ref 5–15)
BUN: 29 mg/dL — ABNORMAL HIGH (ref 8–23)
CO2: 31 mmol/L (ref 22–32)
Calcium: 8.7 mg/dL — ABNORMAL LOW (ref 8.9–10.3)
Chloride: 94 mmol/L — ABNORMAL LOW (ref 98–111)
Creatinine, Ser: 0.54 mg/dL — ABNORMAL LOW (ref 0.61–1.24)
GFR calc Af Amer: >60 mL/min (ref >60)
GFR calc non Af Amer: >60 mL/min (ref >60)
Glucose, Bld: 129 mg/dL — ABNORMAL HIGH (ref 70–99)
Potassium: 4.3 mmol/L (ref 3.5–5.1)
Sodium: 135 mmol/L (ref 135–145)

## 2019-07-01 LAB — MAGNESIUM: Magnesium: 2.4 mg/dL (ref 1.7–2.4)

## 2019-07-01 LAB — PHOSPHORUS: Phosphorus: 3.7 mg/dL (ref 2.5–4.6)

## 2019-07-01 NOTE — Progress Notes (Signed)
BIPAP on standby at this time. Will place back on patient if he needs it. Treatment being given now. O2 sat 98% on 4 lpm HFNC, RR: 25-34.

## 2019-07-01 NOTE — Progress Notes (Signed)
NAME:  Cheron Neyer, MRN:  [id number], DOB:  Apr 26, 1950, LOS: 4 ADMISSION DATE:  06/27/2019, CONSULTATION DATE:  07/01/2019  REFERRING MD:  Ria Comment MD, CHIEF COMPLAINT: Respiratory distress  Brief History   69 year old man with advanced COPD on 4 to 5 L oxygen admitted with respiratory distress and acute hypercarbic respiratory failure.  Remains BiPAP dependent hence PCCM is to consult  History of present illness   69 year old ex-smoker with COPD on 4 to 5 L of oxygen for chronic hypoxic respiratory failure admitted with shortness of breath and coughing, placed on BiPAP Chest x-ray personally reviewed which showed extensive bullous disease of the lower right lung with mediastinal shift to the left ABG was 7.4 0/50/74 He has remained BiPAP dependent and hence we are asked to consult Per nursing, came off BiPAP this morning for about an hour and then developed hypoxia and severe anxiety Wife reports that he has severe anxiety and Xanax seem to help over the past few months  I have reviewed his recent pulmonology evaluation with [facility] COPD, bullous emphysema, 4 to 5 L of oxygen at baseline, recent screening CT noted to have left lower lobe and left lower lobe pulmonary nodules measuring up to 1.4 cm with no hypermetabolic activity on PET He smoked more than 40 pack years before quitting in 2018 He was advised to continue Trelegy, palliative versus hospice care to help with as needed morphine for dyspnea  Past Medical History  AAA  Significant Hospital Events     Consults:    Procedures:    Significant Diagnostic Tests:  CT chest 05/2019  >> Small amount of layering debris within the trachea. No pleural effusion or pneumothorax. There is severe emphysema with bullous replacement of nearly the entire right middle lobe. There is atelectasis in the medial aspect of the right lower lobe. There is also consolidation in the medial aspect of the right lower lobe.   CT chest 03/2018  -bullous changes of right lower lung, nodular thickening at right base unchanged compared to 2019  Micro Data:    Antimicrobials:     Interim history/subjective:   Appears improved Afebrile Does not use BiPAP last 24 hours, placed back on it around 2 PM  Objective   Blood pressure (!) 149/83, pulse 95, temperature 98 F (36.7 C), temperature source Axillary, resp. rate (!) 28, height 5\' 7"  (1.702 m), weight 62.5 kg, SpO2 94 %.    FiO2 (%):  [40 %] 40 %   Intake/Output Summary (Last 24 hours) at 07/01/2019 1534 Last data filed at 06/30/2019 1700 Gross per 24 hour  Intake --  Output 950 ml  Net -950 ml   Filed Weights   06/28/19 0400 06/29/19 0500 07/01/19 0710  Weight: 62.1 kg 61.5 kg 62.5 kg    Examination: General: Elderly man, no distress, on [place] HENT: Mild pallor, no icterus, no JVD Lungs: Decreased breath sounds on right, clear on left, no rhonchi Cardiovascular: S1-S2 regular Abdomen: Soft nontender abdomen Extremities: No edema Neuro: Alert, interactive, no asterixis   Chest x-ray personally reviewed which shows bullous changes of the right lung, no clear infiltrate or effusion  Resolved Hospital Problem list     Assessment & Plan:  Acute on chronic hypoxic/hypercarbic respiratory failure Treating as COPD exacerbation , no other cause identified Severe anxiety Underlying end-stage bullous emphysema  Plan -Please like him to continue nocturnal BiPAP and only as needed daytime.  Gradually wean him off daytime use -Decrease oxygen to maintain saturations 90% and  above, baseline 5L -Ct  Xanax and adding low-dose Ativan for breakthrough anxiety -If WOB becomes excessive, consider adding low-dose morphine for chronic dyspnea  -Continue current regimen of Pulmicort/Brovana/DuoNebs -Continue IV Solu-Medrol 60 every 12, decrease to 60 every 24 tomorrow    Kara Mead MD. FCCP. [facility]  If no response to pager , please call [phone]  [phone]   07/01/2019

## 2019-07-01 NOTE — Progress Notes (Signed)
Patient is resting comfortably on 7L HFNC with sats of 100%. All vitals are stable and patient is in no distress. BIPAP is at bedside but is not needed at this time. RN aware.

## 2019-07-01 NOTE — Progress Notes (Signed)
Pt requested BIPAP placement by RN. Pt decided to take scheduled nebulizer via aerosol mask instead of inline with BIPAP. Pt requested BIPAP placement after nebulizer treatment. Pt returned to BIPAP on previous setting. VS stable throughout

## 2019-07-01 NOTE — Progress Notes (Signed)
PT Cancellation Note  Patient Details Name: Jay Mullins MRN: [id number] DOB: [birth year]   Cancelled Treatment:    Reason Eval/Treat Not Completed: Patient declined, no reason specified.  Patient refused therapy x 3 attempts due to c/o fatigue and apprehension - RN aware.  Will check back tomorrow.    2:31 PM, 07/01/19 Lonell Grandchild, MPT Physical Therapist with [facility] [phone] office [phone] mobile phone

## 2019-07-01 NOTE — Progress Notes (Addendum)
PROGRESS NOTE  Jay Mullins [id number] DOB: Dec 23, 1950 DOA: 06/27/2019 PCP: Chesley Noon, MD  Brief History: 69 year old male with a history of COPD, hypertension, chronic respiratory failure on 5 L, coronary artery disease, AAA, BPH, depression presenting with 5 to 6-day history of worsening shortness of breath and coughing. He denies any fevers, chills, chest pain, nausea, vomiting, diarrhea, abdominal pain. He denies any recent sick contacts. His cough is nonproductive. He denies any hemoptysis. The patient quit smoking 3 years ago. He has over 50-pack-year history of tobacco. Notably, the patient states that he has been taking BC powders on a daily basis for the last several weeks. In the emergency department, the patient was afebrile hemodynamically stable. He required BiPAP because of his respiratory distress. BMP and CBC were essentially unremarkable. UA was negative for pyuria. Chest x-ray showed severe emphysema with bullous changes in the right lower lobe. The patient was admitted for further treatment of his acute on chronic respiratory failure.  Assessment/Plan: Acute on chronic respiratory failure with hypoxia and hypercarbia -Secondary to COPD exacerbation -Patient requiring BiPAP ventilationmore frequently now -partly driven by anxiety -Wean oxygen back to 5 L as tolerated for saturation 88-92% -Personally reviewed EKG--sinus rhythm, nonspecific T wave changes -06/30/19 CXR--unchanged bullous changes R-lung and interstitial prominence -wean for saturation 88-90% -increase xanax to 1mg  tid -ativan prn for breakthrough -wean off BiPAP during day if possible to night time only -consult pulmonary--appreciated  COPD exacerbation -Start Pulmicort -Start duo nebs -Continue IV Solu-Medrol -continue Brovana  Depression/anxiety -Patient does NOT have allergy to ativan despite documentation by 06/28/19-06/29/19 PM RN -pt received multiple doses  ativan IV (due to risk of aspiration being on BiPAP) with good response on 06/28/19--no rashes, urticaria, angioedema nor anaphylactoid type response with IV ativan -PMP Aware queried--pt receives xanax 1 mg #90 monthly from one provider Melford Aase, MD) -increase xanax to q 6 hrs -continue IV ativan for breakthrough anxiety  Essential hypertension -Holding lisinopril secondary to increasing potassium -BP stable  Hyperlipidemia -Continue statin  Coronary artery disease -No chest pain presently -Continue aspirin  BPH -Continue tamsulosin and finasteride  Goals of care -DNR confirmed with family -palliative medicine following   Disposition Plan: Patient From: Home D/C Place: Home with hospice vs residential hospice Barriers: Not Clinically Stable--requiring BiPAP more frequently now  Family Communication:spouse and son updated at bedside 4/27  Consultants:pulmonary, palliative medicine  Code Status: DNR  DVT Prophylaxis: [place] Lovenox   Procedures: As Listed in Progress Note Above  Antibiotics: None   Subjective: Pt complains of sob with any type of minimal activity.  Denies f/c, cp, n/v/d, abd pain.  Anxiety is high with sob  Objective: Vitals:   07/01/19 1130 07/01/19 1350 07/01/19 1359 07/01/19 1640  BP:      Pulse: 95     Resp: (!) 28     Temp:    98 F (36.7 C)  TempSrc:    Oral  SpO2: 91% 96% 94%   Weight:      Height:        Intake/Output Summary (Last 24 hours) at 07/01/2019 1831 Last data filed at 07/01/2019 1639 Gross per 24 hour  Intake --  Output 550 ml  Net -550 ml   Weight change:  Exam:   General:  Pt is alert, follows commands appropriately, not in acute distress  HEENT: No icterus, No thrush, No neck mass, [place]/AT  Cardiovascular: RRR, S1/S2, no rubs, no gallops  Respiratory:  diminished breath sounds.  Bibasilar rales.  Basilar wheeze  Abdomen: Soft/+BS, non tender, non distended, no guarding  Extremities: No  edema, No lymphangitis, No petechiae, No rashes, no synovitis   Data Reviewed: I have personally reviewed following labs and imaging studies Basic Metabolic Panel: Recent Labs  Lab 06/27/19 1540 06/28/19 0418 06/29/19 0416 06/30/19 0347 07/01/19 0401  NA 135 133* 135 135 135  K 4.7 4.9 4.7 4.5 4.3  CL 94* 94* 95* 94* 94*  CO2 30 31 29 31 31   GLUCOSE 118* 133* 127* 121* 129*  BUN 19 21 30* 37* 29*  CREATININE 0.67 0.63 0.71 0.78 0.54*  CALCIUM 9.2 8.9 9.1 9.0 8.7*  MG  --   --  2.3 2.3 2.4  PHOS  --   --   --   --  3.7   Liver Function Tests: No results for input(s): AST, ALT, ALKPHOS, BILITOT, PROT, ALBUMIN in the last 168 hours. No results for input(s): LIPASE, AMYLASE in the last 168 hours. No results for input(s): AMMONIA in the last 168 hours. Coagulation Profile: No results for input(s): INR, PROTIME in the last 168 hours. CBC: Recent Labs  Lab 06/27/19 1540 06/28/19 0418 06/29/19 0416 06/30/19 0347  WBC 7.2 5.6 6.7 10.3  HGB 12.1* 10.9* 11.4* 11.0*  HCT 40.5 35.7* 37.3* 36.0*  MCV 89.2 86.7 88.0 87.4  PLT 301 318 319 342   Cardiac Enzymes: No results for input(s): CKTOTAL, CKMB, CKMBINDEX, TROPONINI in the last 168 hours. BNP: Invalid input(s): POCBNP CBG: No results for input(s): GLUCAP in the last 168 hours. HbA1C: No results for input(s): HGBA1C in the last 72 hours. Urine analysis:    Component Value Date/Time   COLORURINE YELLOW 06/27/2019 1622   APPEARANCEUR CLEAR 06/27/2019 1622   LABSPEC 1.015 06/27/2019 1622   PHURINE 7.0 06/27/2019 1622   GLUCOSEU NEGATIVE 06/27/2019 1622   HGBUR MODERATE (A) 06/27/2019 1622   BILIRUBINUR NEGATIVE 06/27/2019 1622   KETONESUR NEGATIVE 06/27/2019 1622   PROTEINUR NEGATIVE 06/27/2019 1622   UROBILINOGEN 1.0 12/13/2014 1248   NITRITE NEGATIVE 06/27/2019 1622   LEUKOCYTESUR NEGATIVE 06/27/2019 1622   Sepsis Labs: @LABRCNTIP (procalcitonin:4,lacticidven:4) ) Recent Results (from the past 240 hour(s))    Respiratory Panel by RT PCR (Flu A&B, Covid) - Nasopharyngeal Swab     Status: None   Collection Time: 06/27/19  7:09 PM   Specimen: Nasopharyngeal Swab  Result Value Ref Range Status   SARS Coronavirus 2 by RT PCR NEGATIVE NEGATIVE Final    Comment: (NOTE) SARS-CoV-2 target nucleic acids are NOT DETECTED. The SARS-CoV-2 RNA is generally detectable in upper respiratoy specimens during the acute phase of infection. The lowest concentration of SARS-CoV-2 viral copies this assay can detect is 131 copies/mL. A negative result does not preclude SARS-Cov-2 infection and should not be used as the sole basis for treatment or other patient management decisions. A negative result may occur with  improper specimen collection/handling, submission of specimen other than nasopharyngeal swab, presence of viral mutation(s) within the areas targeted by this assay, and inadequate number of viral copies (<131 copies/mL). A negative result must be combined with clinical observations, patient history, and epidemiological information. The expected result is Negative. Fact Sheet for Patients:  PinkCheek.be Fact Sheet for Healthcare Providers:  GravelBags.it This test is not yet ap proved or cleared by the [place] FDA and  has been authorized for detection and/or diagnosis of SARS-CoV-2 by FDA under an Emergency Use Authorization (EUA). This EUA will remain  in  effect (meaning this test can be used) for the duration of the COVID-19 declaration under Section 564(b)(1) of the Act, 21 U.S.C. section 360bbb-3(b)(1), unless the authorization is terminated or revoked sooner.    Influenza A by PCR NEGATIVE NEGATIVE Final   Influenza B by PCR NEGATIVE NEGATIVE Final    Comment: (NOTE) The Xpert Xpress SARS-CoV-2/FLU/RSV assay is intended as an aid in  the diagnosis of influenza from Nasopharyngeal swab specimens and  should not be used as a sole  basis for treatment. Nasal washings and  aspirates are unacceptable for Xpert Xpress SARS-CoV-2/FLU/RSV  testing. Fact Sheet for Patients: PinkCheek.be Fact Sheet for Healthcare Providers: GravelBags.it This test is not yet approved or cleared by the [place] FDA and  has been authorized for detection and/or diagnosis of SARS-CoV-2 by  FDA under an Emergency Use Authorization (EUA). This EUA will remain  in effect (meaning this test can be used) for the duration of the  Covid-19 declaration under Section 564(b)(1) of the Act, 21  U.S.C. section 360bbb-3(b)(1), unless the authorization is  terminated or revoked. Performed at [facility], [address] [place], [place] [address]   MRSA PCR Screening     Status: None   Collection Time: 06/27/19  9:50 PM   Specimen: Nasal Mucosa; Nasopharyngeal  Result Value Ref Range Status   MRSA by PCR NEGATIVE NEGATIVE Final    Comment:        The GeneXpert MRSA Assay (FDA approved for NASAL specimens only), is one component of a comprehensive MRSA colonization surveillance program. It is not intended to diagnose MRSA infection nor to guide or monitor treatment for MRSA infections. Performed at [facility] [place], [address], [place], [place] [address]      Scheduled Meds: . ALPRAZolam  1 mg Oral TID  . arformoterol  15 mcg Nebulization BID  . aspirin EC  81 mg Oral Daily  . budesonide (PULMICORT) nebulizer solution  0.5 mg Nebulization BID  . Chlorhexidine Gluconate Cloth  6 each Topical Daily  . enoxaparin (LOVENOX) injection  40 mg Subcutaneous Q24H  . escitalopram  20 mg Oral Daily  . feeding supplement  1 Container Oral TID BM  . finasteride  5 mg Oral QHS  . ipratropium-albuterol  3 mL Nebulization Q6H  . mouth rinse  15 mL Mouth Rinse BID  . methylPREDNISolone (SOLU-MEDROL) injection  60 mg Intravenous Q12H  . pantoprazole  40 mg Oral Daily  . pravastatin  20 mg Oral  QHS  . sodium chloride flush  3 mL Intravenous Q12H  . tamsulosin  0.4 mg Oral Daily  . cholecalciferol  1,000 Units Oral Daily   Continuous Infusions: . sodium chloride      Procedures/Studies: DG CHEST PORT 1 VIEW  Result Date: 06/30/2019 CLINICAL DATA:  COPD exacerbation EXAM: PORTABLE CHEST 1 VIEW COMPARISON:  Portable exam 1314 hours compared to 06/27/2019 FINDINGS: Normal heart size and pulmonary vascularity. Emphysematous and central bronchitic changes consistent with COPD. Extensive bullous disease and hyperinflation of the lower RIGHT lung with mediastinal shift to the LEFT. Chronic accentuation of interstitial markings RIGHT upper lobe and throughout LEFT lung. No definite acute infiltrate, pleural effusion or pneumothorax. Tiny nodular density in the lower RIGHT lung seen on the previous exam not identified. Bones demineralized. IMPRESSION: Changes of COPD and chronic interstitial prominence with advanced bullous changes of the lower RIGHT lung and persistent mediastinal shift to LEFT. No acute infiltrate. Electronically Signed   By: Crist Infante.D.  On: 06/30/2019 13:31   DG Chest Port 1 View  Result Date: 06/27/2019 CLINICAL DATA:  69 year old male with shortness of breath. EXAM: PORTABLE CHEST 1 VIEW COMPARISON:  Chest radiograph dated 09/04/2017 and CT dated 03/07/2018. FINDINGS: There is severe emphysema with severe bullous changes in the right lung base. No focal consolidation, pleural effusion, pneumothorax. The cardiac silhouette is within normal limits. There is prominence of the central pulmonary arteries suggestive of a degree of pulmonary hypertension. Clinical correlation is recommended. A 7 mm nodular density over the right mid lung field likely represents nipple shadow. No acute osseous pathology. IMPRESSION: 1. No acute cardiopulmonary process. 2. Severe emphysema with severe bullous changes in the right lung base. Electronically Signed   By: Anner Crete M.D.   On:  06/27/2019 16:13    Orson Eva, DO  [facility]  If 7PM-7AM, please contact night-coverage [email or web address] Password [facility] 07/01/2019, 6:31 PM   LOS: 4 days

## 2019-07-01 NOTE — Consult Note (Addendum)
Consultation Note Date: 07/01/2019   Patient Name: Jay Mullins  DOB: 1950/10/06  MRN: [id number]  Age / Sex: 69 y.o., male  PCP: Chesley Noon, MD Referring Physician: Orson Eva, MD  Reason for Consultation: Establishing goals of care and Psychosocial/spiritual support  HPI/Patient Profile: 69 y.o. male  with past medical history of COPD on home oxygen, panlobular emphysema, AAA with stent graft 2018, HTN, MI 1980s, BPH, depression, GERD, headache/brain surgery - mass left front side 2015, history of tobacco use with 40-pack-year history admitted on 06/27/2019 with acute on chronic respiratory failure with hypoxia and hypercapnia, COPD exacerbation.   Clinical Assessment and Goals of Care: [name], Mullins, is resting quietly in bed.  He appears acutely/chronically ill, and older than stated age.  He is alert and oriented, able to make his basic needs known.  There is no family at bedside at this time.  We talked about his acute and chronic health conditions including but not limited to his respiratory status.  He tells me that he feels that he is improving.  We talked about follow-up with pulmonologist, and Jay Mullins tells me that he will continue to follow with pulmonology.  He shares that he lives in [place], had seen a pulmonologist in [place] in the past, but that he will continue with the pulmonology service that he has seen in the hospital.  We talked about symptom management; the use of benzodiazepines and morphine.  He states that he feels these medications help him.  We also talked about at home hospice care to assist with prescribing and managing these medications.  Jay Mullins shares that 2 of his sisters also had lung disease, and they had hospice care.  He shares that they had hospice care for several years prior to passing.  [name] tells me that he is ready to accept "treat the treatable" and  home hospice care at this time.  He has no preference for provider.  Conference with attending, bedside nursing staff, transition of care team related to patient condition, needs, goals of care, agreement for in-home "treat the treatable" hospice care for continue goalsetting and symptom management.  Conference with chaplain service related to patient request for assistance in completing healthcare power of attorney paperwork.  I return later in the day to find Jay Mullins at bedside along with her daughter.  AD paperwork briefly reviewed and left with patient and family.   HCPOA    OTHER -[name] is requesting that his significant other of 15 years, Jay Mullins, be his Ambulance person.  We talked about completing healthcare power of attorney paperwork, and he is agreeable.  Consult made for completion of paperwork.    SUMMARY OF RECOMMENDATIONS   Continue to treat the treatable but no CPR or intubation.   Agreeable to "treat the treatable" in-home hospice care (provider undetermined). At this point, rehospitalize as needed  Code Status/Advance Care Planning:  DNR  Symptom Management:   Per hospitalist, no additional needs at this time.   Palliative Prophylaxis:  Frequent Pain Assessment and Oral Care  Additional Recommendations (Limitations, Scope, Preferences):  treat the treatable, no CPR or intubation.  Agreeable to hospice.   Psycho-social/Spiritual:   Desire for further Chaplaincy support:no  Additional Recommendations: Caregiving  Support/Resources and Education on Hospice  Prognosis:   Unable to determine.  Pulmonary disorders are notoriously difficult to prognosticate.  6 months or less is expected based on chronic illness burden.   Discharge Planning: anticipate home with Hospice care.        Primary Diagnoses: Present on Admission: . Acute on chronic respiratory failure with hypoxia and hypercapnia (HCC) . Essential (primary) hypertension .  COPD with acute exacerbation ([place]) . Anxiety . COPD exacerbation ([place])   I have reviewed the medical record, interviewed the patient and family, and examined the patient. The following aspects are pertinent.  Past Medical History:  Diagnosis Date  . AAA (abdominal aortic aneurysm) ([place])    infrarenal AAA  . Aortic aneurysm ([place])   . Arthritis   . Benign prostatic hyperplasia   . COPD (chronic obstructive pulmonary disease) ([place])   . Depression   . Dyspnea    on exertion  . GERD (gastroesophageal reflux disease)   . Headache   . Hypertension   . Myocardial infarction ([place]) 1980's  . On home oxygen therapy    "2l; 24/7 when I'm home" (07/27/2016)  . Panlobular emphysema ([place])   . Pneumonia    9/18  . Prostate disorder    Social History   Socioeconomic History  . Marital status: Significant Other    Spouse name: Not on file  . Number of children: Not on file  . Years of education: Not on file  . Highest education level: Not on file  Occupational History  . Not on file  Tobacco Use  . Smoking status: Former Smoker    Packs/day: 1.00    Years: 40.00    Pack years: 40.00    Types: Cigarettes    Quit date: 2018    Years since quitting: 3.3  . Smokeless tobacco: Former Systems developer    Types: Snuff, Chew  Substance and Sexual Activity  . Alcohol use: No    Alcohol/week: 0.0 standard drinks  . Drug use: No  . Sexual activity: Not on file  Other Topics Concern  . Not on file  Social History Narrative   Worked in plumbing, Firefighter, carpenting.   Social Determinants of Health   Financial Resource Strain:   . Difficulty of Paying Living Expenses:   Food Insecurity:   . Worried About Charity fundraiser in the Last Year:   . Arboriculturist in the Last Year:   Transportation Needs:   . Film/video editor (Medical):   Marland Kitchen Lack of Transportation (Non-Medical):   Physical Activity:   . Days of Exercise per Week:   . Minutes of Exercise per Session:   Stress:   .  Feeling of Stress :   Social Connections:   . Frequency of Communication with Friends and Family:   . Frequency of Social Gatherings with Friends and Family:   . Attends Religious Services:   . Active Member of Clubs or Organizations:   . Attends Archivist Meetings:   Marland Kitchen Marital Status:    Family History  Problem Relation Age of Onset  . COPD Sister    Scheduled Meds: . ALPRAZolam  1 mg Oral TID  . arformoterol  15 mcg Nebulization BID  . aspirin EC  81 mg Oral Daily  . budesonide (PULMICORT) nebulizer solution  0.5 mg Nebulization BID  . Chlorhexidine Gluconate Cloth  6 each Topical Daily  . enoxaparin (LOVENOX) injection  40 mg Subcutaneous Q24H  . escitalopram  20 mg Oral Daily  . feeding supplement  1 Container Oral TID BM  . finasteride  5 mg Oral QHS  . ipratropium-albuterol  3 mL Nebulization Q6H  . mouth rinse  15 mL Mouth Rinse BID  . methylPREDNISolone (SOLU-MEDROL) injection  60 mg Intravenous Q12H  . pantoprazole  40 mg Oral Daily  . pravastatin  20 mg Oral QHS  . sodium chloride flush  3 mL Intravenous Q12H  . tamsulosin  0.4 mg Oral Daily  . cholecalciferol  1,000 Units Oral Daily   Continuous Infusions: . sodium chloride     PRN Meds:.sodium chloride, albuterol, ALPRAZolam, bisacodyl, hydrALAZINE, HYDROcodone-homatropine, LORazepam, ondansetron **OR** ondansetron (ZOFRAN) IV, polyethylene glycol, sodium chloride flush, traMADol Medications Prior to Admission:  Prior to Admission medications   Medication Sig Start Date End Date Taking? Authorizing Provider  albuterol (PROVENTIL HFA;VENTOLIN HFA) 108 (90 Base) MCG/ACT inhaler Inhale 2 puffs into the lungs every 6 (six) hours as needed for wheezing or shortness of breath.   Yes [provider]  ALPRAZolam Duanne Moron) 1 MG tablet Take 1-2 mg at bedtime as needed by mouth for anxiety (sleep). Take 1 tablet (1 mg) by mouth daily at bedtime, may also take 1/2-1 tablet (0.5 - 1 mg) during the day as  needed for anxiety    Yes [provider]  aspirin EC 81 MG tablet Take 81 mg by mouth daily.   Yes [provider]  Aspirin-Salicylamide-Caffeine (BC HEADACHE) 325-95-16 MG TABS Take 1 packet by mouth once as needed (FOR PAIN).   Yes [provider]  budesonide-formoterol (SYMBICORT) 160-4.5 MCG/ACT inhaler Inhale 1 puff into the lungs 2 (two) times daily.    Yes [provider]  Cholecalciferol (D 1000) 1000 units capsule Take 1,000 Units by mouth daily.    Yes [provider]  colchicine 0.6 MG tablet Take 0.6 mg by mouth every 6 (six) hours as needed (gout flares). Mitigare   Yes [provider]  escitalopram (LEXAPRO) 20 MG tablet Take 20 mg by mouth daily.   Yes [provider]  finasteride (PROSCAR) 5 MG tablet Take 5 mg by mouth at bedtime.  10/17/15  Yes [provider]  Fluticasone-Umeclidin-Vilant (TRELEGY ELLIPTA) 200-62.5-25 MCG/INH AEPB Inhale into the lungs. 05/06/19  Yes [provider]  ipratropium-albuterol (DUONEB) 0.5-2.5 (3) MG/3ML SOLN Inhale 3 mLs into the lungs every 6 (six) hours as needed (shortness of breath/wheezing).  11/26/14  Yes [provider]  lisinopril (PRINIVIL,ZESTRIL) 10 MG tablet Take 10 mg by mouth at bedtime.   Yes [provider]  OXYGEN Inhale 5 L into the lungs continuous.    Yes [provider]  pantoprazole (PROTONIX) 40 MG tablet Take 40 mg by mouth daily. Received from [facility]   Yes [provider]  pravastatin (PRAVACHOL) 20 MG tablet TAKE 1 TABLET BY MOUTH AT BEDTIME 11/12/18  Yes Fay Records, MD  tamsulosin (FLOMAX) 0.4 MG CAPS capsule Take 0.4 mg by mouth daily. Received from [facility]    Yes [provider]  Tiotropium Bromide Monohydrate (SPIRIVA RESPIMAT) 2.5 MCG/ACT AERS Inhale 1 puff into the lungs daily.   Yes [provider]  butalbital-acetaminophen-caffeine (FIORICET, ESGIC) 50-325-40 MG tablet Take 2  tablets every 4 (four) hours as  needed by mouth for headache.     [provider]   Allergies  Allergen Reactions  . Lorazepam Other (See Comments)    hallucinations 06/28/19--tolerated IV ativan--without any rash, hives, angioedema or any anaphlylactoid response   Review of Systems  Unable to perform ROS: Age    Physical Exam Vitals and nursing note reviewed.  Constitutional:      General: He is not in acute distress.    Appearance: He is ill-appearing.  HENT:     Head: Atraumatic.  Cardiovascular:     Rate and Rhythm: Normal rate.  Pulmonary:     Effort: Pulmonary effort is normal. No tachypnea.  Abdominal:     Palpations: Abdomen is soft.  Skin:    General: Skin is warm and dry.  Neurological:     Mental Status: He is alert and oriented to person, place, and time.  Psychiatric:        Mood and Affect: Mood normal.        Behavior: Behavior normal.     Vital Signs: BP (!) 149/83   Pulse 95   Temp 98 F (36.7 C) (Axillary)   Resp (!) 28   Ht 5\' 7"  (1.702 m)   Wt 62.5 kg   SpO2 91%   BMI 21.58 kg/m  Pain Scale: 0-10 POSS *See Group Information*: 1-Acceptable,Awake and alert Pain Score: 0-No pain   SpO2: SpO2: 91 % O2 Device:SpO2: 91 % O2 Flow Rate: .O2 Flow Rate (L/min): 6 L/min  IO: Intake/output summary:   Intake/Output Summary (Last 24 hours) at 07/01/2019 1236 Last data filed at 06/30/2019 1700 Gross per 24 hour  Intake --  Output 950 ml  Net -950 ml    LBM: Last BM Date: 06/28/19 Baseline Weight: Weight: 67.1 kg Most recent weight: Weight: 62.5 kg     Palliative Assessment/Data:   Flowsheet Rows     Most Recent Value  Intake Tab  Referral Department  Hospitalist  Unit at Time of Referral  Intermediate Care Unit  Palliative Care Primary Diagnosis  Pulmonary  Date Notified  06/30/19  Palliative Care Type  New Palliative care  Reason for referral  Clarify Goals of Care, Counsel Regarding Hospice  Date of Admission  06/27/19  Date  first seen by Palliative Care  07/01/19  # of days Palliative referral response time  1 Day(s)  # of days IP prior to Palliative referral  3  Clinical Assessment  Palliative Performance Scale Score  30%  Pain Max last 24 hours  Not able to report  Pain Min Last 24 hours  Not able to report  Dyspnea Max Last 24 Hours  Not able to report  Dyspnea Min Last 24 hours  Not able to report  Psychosocial & Spiritual Assessment  Palliative Care Outcomes      Time In: 0910 Time Out: 1020  Time Total: 70 minutes  Greater than 50%  of this time was spent counseling and coordinating care related to the above assessment and plan.  Signed by: Drue Novel, NP   Please contact Palliative Medicine Team phone at [phone] for questions and concerns.  For individual provider: See Shea Evans

## 2019-07-02 DIAGNOSIS — J441 Chronic obstructive pulmonary disease with (acute) exacerbation: Secondary | ICD-10-CM

## 2019-07-02 LAB — BASIC METABOLIC PANEL
Anion gap: 10 (ref 5–15)
BUN: 33 mg/dL — ABNORMAL HIGH (ref 8–23)
CO2: 33 mmol/L — ABNORMAL HIGH (ref 22–32)
Calcium: 8.8 mg/dL — ABNORMAL LOW (ref 8.9–10.3)
Chloride: 93 mmol/L — ABNORMAL LOW (ref 98–111)
Creatinine, Ser: 0.7 mg/dL (ref 0.61–1.24)
GFR calc Af Amer: >60 mL/min (ref >60)
GFR calc non Af Amer: >60 mL/min (ref >60)
Glucose, Bld: 154 mg/dL — ABNORMAL HIGH (ref 70–99)
Potassium: 4.6 mmol/L (ref 3.5–5.1)
Sodium: 136 mmol/L (ref 135–145)

## 2019-07-02 LAB — PHOSPHORUS: Phosphorus: 3.8 mg/dL (ref 2.5–4.6)

## 2019-07-02 LAB — MAGNESIUM: Magnesium: 2.5 mg/dL — ABNORMAL HIGH (ref 1.7–2.4)

## 2019-07-02 MED ORDER — METHYLPREDNISOLONE SODIUM SUCC 125 MG IJ SOLR
60.0000 mg | Freq: Every day | INTRAMUSCULAR | Status: DC
  Administered 2019-07-02: 60 mg via INTRAVENOUS
  Filled 2019-07-02: qty 2

## 2019-07-02 NOTE — Progress Notes (Signed)
NAME:  Jay Mullins, MRN:  [id number], DOB:  1950/07/22, LOS: 5 ADMISSION DATE:  06/27/2019, CONSULTATION DATE:  07/02/2019  REFERRING MD:  Ria Comment MD, CHIEF COMPLAINT: Respiratory distress  Brief History   69 year old man with advanced COPD on 4 to 5 L oxygen admitted with respiratory distress and acute hypercarbic respiratory failure.  Remains BiPAP dependent hence PCCM is to consult  History of present illness   69 year old ex-smoker with COPD on 4 to 5 L of oxygen for chronic hypoxic respiratory failure admitted with shortness of breath and coughing, placed on BiPAP Chest x-ray personally reviewed which showed extensive bullous disease of the lower right lung with mediastinal shift to the left ABG was 7.4 0/50/74 He has remained BiPAP dependent and hence we are asked to consult Per nursing, came off BiPAP this morning for about an hour and then developed hypoxia and severe anxiety Wife reports that he has severe anxiety and Xanax seem to help over the past few months  I have reviewed his recent pulmonology evaluation with [facility] COPD, bullous emphysema, 4 to 5 L of oxygen at baseline, recent screening CT noted to have left lower lobe and left lower lobe pulmonary nodules measuring up to 1.4 cm with no hypermetabolic activity on PET He smoked more than 40 pack years before quitting in 2018 He was advised to continue Trelegy, palliative versus hospice care to help with as needed morphine for dyspnea  Past Medical History  AAA  Significant Hospital Events     Consults:    Procedures:    Significant Diagnostic Tests:  CT chest 05/2019  >> Small amount of layering debris within the trachea. No pleural effusion or pneumothorax. There is severe emphysema with bullous replacement of nearly the entire right middle lobe. There is atelectasis in the medial aspect of the right lower lobe. There is also consolidation in the medial aspect of the right lower lobe.   CT chest 03/2018  -bullous changes of right lower lung, nodular thickening at right base unchanged compared to 2019  Micro Data:    Antimicrobials:     Interim history/subjective:   Afebrile, out of bed Wife and daughter at bedside Again did not use BiPAP overnight   Objective   Blood pressure 130/88, pulse 77, temperature 98.2 F (36.8 C), temperature source Oral, resp. rate (!) 26, height 5\' 7"  (1.702 m), weight 59.5 kg, SpO2 97 %.    FiO2 (%):  [40 %] 40 %   Intake/Output Summary (Last 24 hours) at 07/02/2019 1352 Last data filed at 07/01/2019 1639 Gross per 24 hour  Intake --  Output 550 ml  Net -550 ml   Filed Weights   06/29/19 0500 07/01/19 0710 07/02/19 0503  Weight: 61.5 kg 62.5 kg 59.5 kg    Examination: General: Elderly man, no distress, on [place] HENT: Mild pallor, no icterus, no JVD Lungs: Decreased breath sounds on right, clear on left, no rhonchi Cardiovascular: S1-S2 regular Extremities: No edema Neuro: Alert, interactive, no asterixis   Chest x-ray 4/26 shows bullous changes of the right lung, no clear infiltrate or effusion  Resolved Hospital Problem list     Assessment & Plan:  Acute on chronic hypoxic/hypercarbic respiratory failure Treating as COPD exacerbation , no other cause identified Severe anxiety Underlying end-stage bullous emphysema  Plan -Decrease oxygen to maintain saturations 90% and above, baseline 5L -BiPAP as needed -Ct  Xanax and adding low-dose Ativan for breakthrough anxiety -If WOB becomes excessive, consider adding low-dose morphine for chronic dyspnea  -  Continue current regimen of Pulmicort/Brovana/DuoNebs -Continue IV Solu-Medrol 60  every 24 -transition to oral prednisone tomorrow  He can transfer to floor if he stays off BiPAP    Kara Mead MD. FCCP. [facility]  If no response to pager , please call [phone]   07/02/2019

## 2019-07-02 NOTE — TOC Initial Note (Signed)
Transition of Care [facility]) - Initial/Assessment Note   Patient Details  Name: Jay Mullins MRN: [id number] Date of Birth: October 15, 1950  Transition [facility] Care [facility]) CM/SW Contact:    Sherie Don, LCSW Phone Number: 07/02/2019, 3:38 PM  Clinical Narrative: TOC received consult for patient's need for home hospice services. CSW met with patient to complete initial assessment and discuss home hospice. Per patient, he lives in a single family home with his significant other, [name]. Patient receives home O2 from [facility] and is currently on 5L. Patient is agreeable to discharge with home hospice and does not have a preference for provider. Patient gave CSW verbal permission to make referral.  CSW called [facility] and spoke with Randall Hiss. [facility] accepted referral and requested orders be placed evaluation and admittance to hospice services if eligible. Per Joneen Roach can provide bipap if it is determined patient will need it going home. CSW spoke with patient and Ms. Laurance Flatten and informed them [facility] accepted the referral. Ms. Laurance Flatten stated patient will need wheelchair at discharge, but patient stated he does not need one at this time. Patient and Ms. Moore requested to speak with chaplain. Order placed for consult with chaplain.               Expected Discharge Plan: Home w Hospice Care Barriers to Discharge: Continued Medical Work up  Patient Goals and CMS Choice Patient states their goals for this hospitalization and ongoing recovery are:: Return home with hospice services CMS Medicare.gov Compare Post Acute Care list provided to:: Patient Choice offered to / list presented to : Patient  Expected Discharge Plan and Services Expected Discharge Plan: [place] In-house Referral: Chaplain Post Acute Care Choice: Hospice([facility]) Living arrangements for the past 2 months: [place]  Prior Living Arrangements/Services Living arrangements for the past 2 months: Single Family  Home Lives with:: Significant Other, Adult Children(Brenda Laurance Flatten (significant other) PH: [phone]) Patient language and need for interpreter reviewed:: Yes Do you feel safe going back to the place where you live?: Yes      Need for Family Participation in Patient Care: No (Comment) Care giver support system in place?: Yes (comment)(Brenda Moore (significant other) PH: [phone]) Current home services: DME(Home O2 w/[facility]) Criminal Activity/Legal Involvement Pertinent to Current Situation/Hospitalization: No - Comment as needed  Activities of Daily Living Home Assistive Devices/Equipment: Shower chair without back, Raised toilet seat with rails, Oxygen, Grab bars in shower, Grab bars around toilet ADL Screening (condition at time of admission) Patient's cognitive ability adequate to safely complete daily activities?: Yes Is the patient deaf or have difficulty hearing?: No Does the patient have difficulty seeing, even when wearing glasses/contacts?: No Does the patient have difficulty concentrating, remembering, or making decisions?: No Patient able to express need for assistance with ADLs?: Yes Does the patient have difficulty dressing or bathing?: No Independently performs ADLs?: Yes (appropriate for developmental age) Does the patient have difficulty walking or climbing stairs?: No Weakness of Legs: Both Weakness of Arms/Hands: Both  Permission Sought/Granted Permission sought to share information with : Other (comment)([facility] for home hospice) Permission granted to share information with : Yes, Verbal Permission Granted Permission granted to share info w AGENCY: [facility]  Emotional Assessment Appearance:: Appears older than stated age Attitude/Demeanor/Rapport: Engaged Affect (typically observed): Accepting Orientation: : Oriented to Place, Oriented to Self, Oriented to  Time, Oriented to Situation Alcohol / Substance Use: Other (comment)(Former smoker) Psych  Involvement: No (comment)  Admission diagnosis:  Acute respiratory distress [R06.03] Dyspnea [  R06.00] COPD exacerbation (HCC) [J44.1] Chronic obstructive pulmonary disease with acute exacerbation ([place]) [J44.1] Patient Active Problem List   Diagnosis Date Noted  . Chronic obstructive pulmonary disease with acute exacerbation ([place])   . Encounter for hospice care discussion   . Acute respiratory distress   . COPD exacerbation ([place]) 06/27/2019  . Community acquired pneumonia of left lower lobe of lung 08/23/2017  . Goals of care, counseling/discussion   . Palliative care by specialist   . DNR (do not resuscitate) discussion   . Severe protein-calorie malnutrition ([place]) 08/18/2017  . Anxiety   . 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 Acute on chronic respiratory failure with hypoxia and hypercapnia ([place]) 11/18/2016  . History of abdominal aortic aneurysm (AAA) 07/27/2016  . Current smoker 03/10/2015  . COPD with acute exacerbation ([place]) 03/10/2015  . Meningioma ([place]) 07/14/2013  . Essential (primary) hypertension 07/09/2013  . Atherosclerotic heart disease of native coronary artery without angina pectoris 07/09/2013   PCP:  Chesley Noon, MD Pharmacy:   [facility] [phone] - [place], [place] - [address] [place] [address] Phone: [phone] Fax: [phone]  Readmission Risk Interventions No flowsheet data found.

## 2019-07-02 NOTE — Progress Notes (Signed)
Palliative: Mr. Cullers is resting quietly in bed.  He greets me making and mostly keeping eye contact.  He appears acutely/chronically ill and frail, older than stated age.  His significant other of 15 years and her daughter are at bedside.   We talked about the treatment plan including anticipated discharge home tomorrow with hospice care.  It seems that Mr. Hamdan is at his baseline with his breathing.  He is to have follow-up outpatient pulmonary visits as he tolerates.  I encourage family to lean on hospice.  Mr. Falsetti is requesting assistance in completing his healthcare power of attorney paperwork.  Conference with attending, bedside nursing staff, transition of care team related to patient condition, needs, goals of care, disposition with hospice.  Plan:   Continue to treat the treatable but no CPR or intubation.  Home with the benefits of hospice.  8 minutes [name], NP Palliative Medicine Team Team Phone # [phone] Greater than 50% of this time was spent counseling and coordinating care related to the above assessment and plan.

## 2019-07-02 NOTE — Plan of Care (Signed)
  Problem: Acute Rehab PT Goals(only PT should resolve) Goal: Pt Will Go Supine/Side To Sit Outcome: Progressing Flowsheets (Taken 07/02/2019 1213) Pt will go Supine/Side to Sit: Independently Goal: Patient Will Transfer Sit To/From Stand Outcome: Progressing Flowsheets (Taken 07/02/2019 1213) Patient will transfer sit to/from stand: with modified independence Goal: Pt Will Transfer Bed To Chair/Chair To Bed Outcome: Progressing Flowsheets (Taken 07/02/2019 1213) Pt will Transfer Bed to Chair/Chair to Bed: with modified independence Goal: Pt Will Ambulate Outcome: Progressing Flowsheets (Taken 07/02/2019 1213) Pt will Ambulate:  25 feet  with supervision  with rolling walker   12:14 PM, 07/02/19 Lonell Grandchild, MPT Physical Therapist with [facility] [phone] office [phone] mobile phone

## 2019-07-02 NOTE — Progress Notes (Signed)
Pt has BIPAP on standby at bedside for PRN use. BIPAP settings are 12/6 and 40% of oxygen.

## 2019-07-02 NOTE — Progress Notes (Signed)
PROGRESS NOTE   Jay Mullins  [id number] DOB: 28-Aug-1950 DOA: 06/27/2019 PCP: Chesley Noon, MD   Chief Complaint  Patient presents with  . Shortness of Breath    Brief Narrative:  69 year old male with a history of COPD, hypertension, chronic respiratory failure on 5 L, coronary artery disease, AAA, BPH, depression presenting with 5 to 6-day history of worsening shortness of breath and coughing. He denies any fevers, chills, chest pain, nausea, vomiting, diarrhea, abdominal pain. He denies any recent sick contacts. His cough is nonproductive. He denies any hemoptysis. The patient quit smoking 3 years ago. He has over 50-pack-year history of tobacco. Notably, the patient states that he has been taking BC powders on a daily basis for the last several weeks.  In the emergency department, the patient was afebrile hemodynamically stable. He required BiPAP because of his respiratory distress. BMP and CBC were essentially unremarkable. UA was negative for pyuria. Chest x-ray showed severe emphysema with bullous changes in the right lower lobe. The patient was admitted for further treatment of his acute on chronic respiratory failure.  Assessment & Plan:   Principal Problem:   COPD with acute exacerbation ([place]) Active Problems:   History of abdominal aortic aneurysm (AAA)   Acute on chronic respiratory failure with hypoxia and hypercapnia (HCC)   Essential (primary) hypertension   Anxiety   Goals of care, counseling/discussion   COPD exacerbation ([place])   Acute respiratory distress   Encounter for hospice care discussion   1. Acute on chronic respiratory failure with hypoxia-secondary to COPD exacerbation-he is slowly responding to treatments.  He did not require BiPAP last night although he may require it again.  Pulmonary has seen him.  His Solu-Medrol is now reduced to once daily.  His anxiety has been managed with alprazolam.  Patient is agreeable to home with hospice  support.  If he remains stable planning to discharge home tomorrow with hospice support. 2. GAD and depression-he has been resumed on home alprazolam.  His symptoms seem to be better controlled now. 3. Essential hypertension -his lisinopril was temporarily held due to rising potassium.  Follow-up blood pressure. 4. Coronary artery disease-stable resume home statin and aspirin.  No chest pain symptoms. 5. BPH resume home tamsulosin and finasteride. 6. DNR on admission   DVT prophylaxis: Enoxaparin Code Status: DNR Family Communication: I made multiple telephone attempts to reach spouse and daughter but there was no answer. Disposition:   Status is: Inpatient  Remains inpatient appropriate because:Hemodynamically unstable and Inpatient level of care appropriate due to severity of illness   Dispo: The patient is from: Home              Anticipated d/c is to: Home              Anticipated d/c date is: 1 day              Patient currently is not medically stable to d/c.    Consultants:   pulmonary  Procedures:     Antimicrobials:    Subjective: Pt didn't use bipap last night, no complaint this morning except for malaise  Objective: Vitals:   07/02/19 0708 07/02/19 0725 07/02/19 0753 07/02/19 1113  BP: 130/88     Pulse: (!) 57 70  77  Resp: 17 (!) 24  (!) 26  Temp:  98.3 F (36.8 C)  98.2 F (36.8 C)  TempSrc:  Oral  Oral  SpO2: 98% 99% 98% 97%  Weight:  Height:        Intake/Output Summary (Last 24 hours) at 07/02/2019 1436 Last data filed at 07/02/2019 1352 Gross per 24 hour  Intake --  Output 1050 ml  Net -1050 ml   Filed Weights   06/29/19 0500 07/01/19 0710 07/02/19 0503  Weight: 61.5 kg 62.5 kg 59.5 kg    Examination:  General exam: Appears calm and comfortable does not appear to be in any distress and speaking in full sentences. Respiratory system: Shallow breathing poor air movement no rales heard no crackles heard. Respiratory effort  normal. Cardiovascular system: S1 & S2 heard, RRR. No JVD, murmurs, rubs, gallops or clicks. No pedal edema. Gastrointestinal system: Abdomen is nondistended, soft and nontender. No organomegaly or masses felt. Normal bowel sounds heard. Central nervous system: Alert and oriented. No focal neurological deficits. Extremities: Symmetric 5 x 5 power. Skin: No rashes, lesions or ulcers Psychiatry: Judgement and insight appear normal. Mood & affect appropriate.     Data Reviewed: I have personally reviewed following labs and imaging studies  CBC: Recent Labs  Lab 06/27/19 1540 06/28/19 0418 06/29/19 0416 06/30/19 0347  WBC 7.2 5.6 6.7 10.3  HGB 12.1* 10.9* 11.4* 11.0*  HCT 40.5 35.7* 37.3* 36.0*  MCV 89.2 86.7 88.0 87.4  PLT 301 318 319 [id number]    Basic Metabolic Panel: Recent Labs  Lab 06/28/19 0418 06/29/19 0416 06/30/19 0347 07/01/19 0401 07/02/19 0402  NA 133* 135 135 135 136  K 4.9 4.7 4.5 4.3 4.6  CL 94* 95* 94* 94* 93*  CO2 31 29 31 31  33*  GLUCOSE 133* 127* 121* 129* 154*  BUN 21 30* 37* 29* 33*  CREATININE 0.63 0.71 0.78 0.54* 0.70  CALCIUM 8.9 9.1 9.0 8.7* 8.8*  MG  --  2.3 2.3 2.4 2.5*  PHOS  --   --   --  3.7 3.8    GFR: Estimated Creatinine Clearance: 74.4 mL/min (by C-G formula based on SCr of 0.7 mg/dL).  Liver Function Tests: No results for input(s): AST, ALT, ALKPHOS, BILITOT, PROT, ALBUMIN in the last 168 hours.  CBG: No results for input(s): GLUCAP in the last 168 hours.   Recent Results (from the past 240 hour(s))  Respiratory Panel by RT PCR (Flu A&B, Covid) - Nasopharyngeal Swab     Status: None   Collection Time: 06/27/19  7:09 PM   Specimen: Nasopharyngeal Swab  Result Value Ref Range Status   SARS Coronavirus 2 by RT PCR NEGATIVE NEGATIVE Final    Comment: (NOTE) SARS-CoV-2 target nucleic acids are NOT DETECTED. The SARS-CoV-2 RNA is generally detectable in upper respiratoy specimens during the acute phase of infection. The  lowest concentration of SARS-CoV-2 viral copies this assay can detect is 131 copies/mL. A negative result does not preclude SARS-Cov-2 infection and should not be used as the sole basis for treatment or other patient management decisions. A negative result may occur with  improper specimen collection/handling, submission of specimen other than nasopharyngeal swab, presence of viral mutation(s) within the areas targeted by this assay, and inadequate number of viral copies (<131 copies/mL). A negative result must be combined with clinical observations, patient history, and epidemiological information. The expected result is Negative. Fact Sheet for Patients:  PinkCheek.be Fact Sheet for Healthcare Providers:  GravelBags.it This test is not yet ap proved or cleared by the [place] FDA and  has been authorized for detection and/or diagnosis of SARS-CoV-2 by FDA under an Emergency Use Authorization (EUA). This EUA will remain  in effect (meaning this test can be used) for the duration of the COVID-19 declaration under Section 564(b)(1) of the Act, 21 U.S.C. section 360bbb-3(b)(1), unless the authorization is terminated or revoked sooner.    Influenza A by PCR NEGATIVE NEGATIVE Final   Influenza B by PCR NEGATIVE NEGATIVE Final    Comment: (NOTE) The Xpert Xpress SARS-CoV-2/FLU/RSV assay is intended as an aid in  the diagnosis of influenza from Nasopharyngeal swab specimens and  should not be used as a sole basis for treatment. Nasal washings and  aspirates are unacceptable for Xpert Xpress SARS-CoV-2/FLU/RSV  testing. Fact Sheet for Patients: PinkCheek.be Fact Sheet for Healthcare Providers: GravelBags.it This test is not yet approved or cleared by the [place] FDA and  has been authorized for detection and/or diagnosis of SARS-CoV-2 by  FDA under an Emergency  Use Authorization (EUA). This EUA will remain  in effect (meaning this test can be used) for the duration of the  Covid-19 declaration under Section 564(b)(1) of the Act, 21  U.S.C. section 360bbb-3(b)(1), unless the authorization is  terminated or revoked. Performed at [facility], [address] [place], [place] [address]   MRSA PCR Screening     Status: None   Collection Time: 06/27/19  9:50 PM   Specimen: Nasal Mucosa; Nasopharyngeal  Result Value Ref Range Status   MRSA by PCR NEGATIVE NEGATIVE Final    Comment:        The GeneXpert MRSA Assay (FDA approved for NASAL specimens only), is one component of a comprehensive MRSA colonization surveillance program. It is not intended to diagnose MRSA infection nor to guide or monitor treatment for MRSA infections. Performed at [facility], [address], [place], [place] [address]          Radiology Studies: No results found.      Scheduled Meds: . ALPRAZolam  1 mg Oral TID  . arformoterol  15 mcg Nebulization BID  . aspirin EC  81 mg Oral Daily  . budesonide (PULMICORT) nebulizer solution  0.5 mg Nebulization BID  . Chlorhexidine Gluconate Cloth  6 each Topical Daily  . enoxaparin (LOVENOX) injection  40 mg Subcutaneous Q24H  . escitalopram  20 mg Oral Daily  . feeding supplement  1 Container Oral TID BM  . finasteride  5 mg Oral QHS  . ipratropium-albuterol  3 mL Nebulization Q6H  . mouth rinse  15 mL Mouth Rinse BID  . methylPREDNISolone (SOLU-MEDROL) injection  60 mg Intravenous Daily  . pantoprazole  40 mg Oral Daily  . pravastatin  20 mg Oral QHS  . sodium chloride flush  3 mL Intravenous Q12H  . tamsulosin  0.4 mg Oral Daily  . cholecalciferol  1,000 Units Oral Daily   Continuous Infusions: . sodium chloride       LOS: 5 days    Critical Care Procedure Note Authorized and Performed by: Murvin Natal MD  Total Critical Care time:  36 mins Due to a high probability of clinically significant, life  threatening deterioration, the patient required my highest level of preparedness to intervene emergently and I personally spent this critical care time directly and personally managing the patient.  This critical care time included obtaining a history; examining the patient, pulse oximetry; ordering and review of studies; arranging urgent treatment with development of a management plan; evaluation of patient's response of treatment; frequent reassessment; and discussions with other providers.  This critical care time was performed to assess and manage the high probability of imminent  and life threatening deterioration that could result in multi-organ failure.  It was exclusive of separately billable procedures and treating other patients and teaching time.    Irwin Brakeman, MD [facility]   To contact the attending provider between 7A-7P or the covering provider during after hours 7P-7A, please log into the web site [email or web address] and access using universal [place] password for that web site. If you do not have the password, please call the hospital operator.  07/02/2019, 2:36 PM

## 2019-07-02 NOTE — Evaluation (Signed)
Physical Therapy Evaluation Patient Details Name: Jay Mullins MRN: [id number] DOB: [birth year] Today's Date: 07/02/2019   History of Present Illness  Jay Mullins is a 69 y.o. male with medical history significant of COPD, hypertension, hyperlipidemia, GERD, depression, anxiety who presented to the ER with worsening shortness of breath.  Patient reports a 3 to 4-day history of increased shortness of breath, wheezing, dyspnea on exertion.  No significant cough or expectoration.No nausea, vomiting, abdominal pain, diarrhea, dysuria, chest pain, dizziness, lightheadedness, seizures, syncope.    Clinical Impression  Patient present on 5 LPM O2, agreeable for therapy after encouragement and limited to a few steps at bedside due to SOB and SpO2 dropping from 93% to 80%, SpO2 increased to 90% after sitting up in chair for approximately 4-5 minutes.  Patient tolerated staying up in chair after therapy - RN notified.  Patient will benefit from continued physical therapy in hospital and recommended venue below to increase strength, balance, endurance for safe ADLs and gait.    Follow Up Recommendations Home health PT;Supervision for mobility/OOB;Supervision - Intermittent    Equipment Recommendations  None recommended by PT    Recommendations for Other Services       Precautions / Restrictions Precautions Precautions: Fall Restrictions Weight Bearing Restrictions: No      Mobility  Bed Mobility Overal bed mobility: Modified Independent                Transfers Overall transfer level: Needs assistance Equipment used: None Transfers: Sit to/from Stand;Stand Pivot Transfers Sit to Stand: Supervision Stand pivot transfers: Supervision       General transfer comment: increased time, labored movement  Ambulation/Gait Ambulation/Gait assistance: Min guard Gait Distance (Feet): 4 Feet Assistive device: None Gait Pattern/deviations: Decreased step length - right;Decreased step  length - left;Decreased stride length Gait velocity: decreased   General Gait Details: limited to 4-5 steps at bedside due to SOB and SpO2 dropping from 93% to 80% while on 5 LPM O2  Stairs            Wheelchair Mobility    Modified Rankin (Stroke Patients Only)       Balance Overall balance assessment: Needs assistance Sitting-balance support: Feet supported;No upper extremity supported Sitting balance-Leahy Scale: Good Sitting balance - Comments: seated at EOB   Standing balance support: During functional activity;No upper extremity supported Standing balance-Leahy Scale: Fair Standing balance comment: slightly unsteady, has to lean on armrest of chair for support                             Pertinent Vitals/Pain Pain Assessment: No/denies pain    Home Living Family/patient expects to be discharged to:: Private residence Living Arrangements: Spouse/significant other Available Help at Discharge: Family;Available 24 hours/day Type of Home: House Home Access: Stairs to enter Entrance Stairs-Rails: None Entrance Stairs-Number of Steps: 1-2 Home Layout: One level Home Equipment: Walker - 4 wheels;Wheelchair - manual Additional Comments: on 5 LPM home O2    Prior Function Level of Independence: Independent         Comments: household ambulator without AD, on 5 LPM home O2     Hand Dominance        Extremity/Trunk Assessment   Upper Extremity Assessment Upper Extremity Assessment: Overall WFL for tasks assessed    Lower Extremity Assessment Lower Extremity Assessment: Generalized weakness    Cervical / Trunk Assessment Cervical / Trunk Assessment: Normal  Communication   Communication: No difficulties  Cognition Arousal/Alertness: Awake/alert Behavior During Therapy: WFL for tasks assessed/performed Overall Cognitive Status: Within Functional Limits for tasks assessed                                        General  Comments      Exercises     Assessment/Plan    PT Assessment Patient needs continued PT services  PT Problem List Decreased strength;Decreased activity tolerance;Decreased balance;Decreased mobility;Cardiopulmonary status limiting activity       PT Treatment Interventions Balance training;Gait training;Stair training;Functional mobility training;Therapeutic activities;Therapeutic exercise;Patient/family education    PT Goals (Current goals can be found in the Care Plan section)  Acute Rehab PT Goals Patient Stated Goal: return home with family to assist PT Goal Formulation: With patient Time For Goal Achievement: 07/09/19 Potential to Achieve Goals: Good    Frequency Min 3X/week   Barriers to discharge        Co-evaluation               AM-PAC PT "6 Clicks" Mobility  Outcome Measure Help needed turning from your back to your side while in a flat bed without using bedrails?: None Help needed moving from lying on your back to sitting on the side of a flat bed without using bedrails?: None Help needed moving to and from a bed to a chair (including a wheelchair)?: A Little Help needed standing up from a chair using your arms (e.g., wheelchair or bedside chair)?: A Little Help needed to walk in hospital room?: A Little Help needed climbing 3-5 steps with a railing? : A Lot 6 Click Score: 19    End of Session Equipment Utilized During Treatment: Oxygen Activity Tolerance: Patient tolerated treatment well;Patient limited by fatigue Patient left: in chair;with call bell/phone within reach Nurse Communication: Mobility status PT Visit Diagnosis: Unsteadiness on feet (R26.81);Other abnormalities of gait and mobility (R26.89);Muscle weakness (generalized) (M62.81)    Time: 1020-1036 PT Time Calculation (min) (ACUTE ONLY): 16 min   Charges:   PT Evaluation $PT Eval Low Complexity: 1 Low PT Treatments $Therapeutic Activity: 8-22 mins        12:12 PM,  07/02/19 Lonell Grandchild, MPT Physical Therapist with [facility] [phone] office [phone] mobile phone

## 2019-07-02 NOTE — Progress Notes (Signed)
BIPAP still on standby if needed. Will place back on patient if he requires it.

## 2019-07-03 DIAGNOSIS — Z66 Do not resuscitate: Secondary | ICD-10-CM

## 2019-07-03 MED ORDER — ALPRAZOLAM 1 MG PO TABS: 1.0000 mg | ORAL_TABLET | Freq: Three times a day (TID) | ORAL | 0 refills | Status: AC | PRN

## 2019-07-03 MED ORDER — IPRATROPIUM-ALBUTEROL 0.5-2.5 (3) MG/3ML IN SOLN: 3.0000 mL | Freq: Four times a day (QID) | RESPIRATORY_TRACT | Status: AC

## 2019-07-03 MED ORDER — PREDNISONE 20 MG PO TABS
60.0000 mg | ORAL_TABLET | Freq: Every day | ORAL | Status: DC
  Administered 2019-07-03: 60 mg via ORAL
  Filled 2019-07-03: qty 3

## 2019-07-03 MED ORDER — HYDROCODONE-HOMATROPINE 5-1.5 MG/5ML PO SYRP: 5.0000 mL | ORAL_SOLUTION | Freq: Four times a day (QID) | ORAL | 0 refills | Status: AC | PRN

## 2019-07-03 MED ORDER — PREDNISONE 20 MG PO TABS: ORAL_TABLET | ORAL | 0 refills | Status: AC

## 2019-07-03 MED ORDER — MORPHINE SULFATE 10 MG/5ML PO SOLN: 2.5000 mg | ORAL | 0 refills | Status: AC | PRN

## 2019-07-03 NOTE — Discharge Summary (Signed)
Physician Discharge Summary  Jay Mullins [id number] DOB: March 01, 1951 DOA: 06/27/2019  PCP: Chesley Noon, MD  Admit date: 06/27/2019 Discharge date: 07/03/2019  Admitted From:  Home  Disposition:  Home with hospice   Discharge Condition: HOSPICE   CODE STATUS: DNR    Brief Hospitalization Summary: Please see all hospital notes, images, labs for full details of the hospitalization. ADMISSION HPI: Jay Mullins is a 69 y.o. male with medical history significant of COPD, hypertension, hyperlipidemia, GERD, depression, anxiety who presented to the ER with worsening shortness of breath.  Patient reports a 3 to 4-day history of increased shortness of breath, wheezing, dyspnea on exertion.  No significant cough or expectoration. No nausea, vomiting, abdominal pain, diarrhea, dysuria, chest pain, dizziness, lightheadedness, seizures, syncope.  ED Course:  Vital Signs reviewed on presentation, significant for temperature 98.9, heart rate 95, blood pressure 149/79, saturation 100% on 7 L nasal cannula. Labs reviewed, significant for sodium 135, potassium 4.7, BUN 19, creatinine 0.67, lactic acid 1.1, WBC count 7.2, hemoglobin 12.1 hematocrit 40, platelets 301.  SARS Covid RT-PCR, flu PCR negative.  Urinalysis is negative.  Imaging personally Reviewed, chest x-ray shows no acute cardiopulmonary process.  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 EKG personally reviewed, shows sinus rhythm, no acute ST-T changes  Brief Narrative:  69 year old male with a history of COPD, hypertension, chronic respiratory failure on 5 L, coronary artery disease, AAA, BPH, depression presenting with 5 to 6-day history of worsening shortness of breath and coughing. He denies any fevers, chills, chest pain, nausea, vomiting, diarrhea, abdominal pain. He denies any recent sick contacts. His cough is nonproductive. He denies any hemoptysis. The patient quit smoking 3 years ago. He has over 50-pack-year  history of tobacco. Notably, the patient states that he has been taking BC powders on a daily basis for the last several weeks.  In the emergency department, the patient was afebrile hemodynamically stable. He required BiPAP because of his respiratory distress. BMP and CBC were essentially unremarkable. UA was negative for pyuria. Chest x-ray showed severe emphysema with bullous changes in the right lower lobe. The patient was admitted for further treatment of his acute on chronic respiratory failure.  Assessment & Plan:   Principal Problem:   COPD with acute exacerbation ([place]) Active Problems:   History of abdominal aortic aneurysm (AAA)   Acute on chronic respiratory failure with hypoxia and hypercapnia (HCC)   Essential (primary) hypertension   Anxiety   Goals of care, counseling/discussion   COPD exacerbation ([place])   Acute respiratory distress   Encounter for hospice care discussion   1. Acute on chronic respiratory failure with hypoxia-secondary to COPD exacerbation-he has slowly responded to treatments and feels that he is at his baseline and would like to go home.  He did not require BiPAP last night although he may require it again and his home hospice agency made arrangements for him to have home bipap.  Pulmonary has seen him.  He wants to discharge home today with hospice.  He will be discharged on a prednisone taper.   2. GAD and depression-he has been resumed on alprazolam.  His symptoms seem to be better controlled now. 3. Essential hypertension -stable. 4. Coronary artery disease-stable resume home statin and aspirin.  No chest pain symptoms. 5. BPH resume home tamsulosin and finasteride. 6. DNR on admission  DVT prophylaxis: Enoxaparin Code Status: DNR Family Communication: updated at bedside  Discharge Diagnoses:  Principal Problem:   COPD with acute exacerbation ([place])  Active Problems:   History of abdominal aortic aneurysm (AAA)   Acute on chronic  respiratory failure with hypoxia and hypercapnia (HCC)   Essential (primary) hypertension   Anxiety   Goals of care, counseling/discussion   COPD exacerbation ([place])   Acute respiratory distress   Encounter for hospice care discussion   Chronic obstructive pulmonary disease with acute exacerbation [facility])   Discharge Instructions:  Allergies as of 07/03/2019      Reactions   Lorazepam Other (See Comments)   hallucinations 06/28/19--tolerated IV ativan--without any rash, hives, angioedema or any anaphlylactoid response      Medication List    STOP taking these medications   BC Headache 325-95-16 MG Tabs Generic drug: Aspirin-Salicylamide-Caffeine   lisinopril 10 MG tablet Commonly known as: ZESTRIL     TAKE these medications   albuterol 108 (90 Base) MCG/ACT inhaler Commonly known as: VENTOLIN HFA Inhale 2 puffs into the lungs every 6 (six) hours as needed for wheezing or shortness of breath.   ALPRAZolam 1 MG tablet Commonly known as: XANAX Take 1 tablet (1 mg total) by mouth 3 (three) times daily as needed for anxiety (sleep). What changed:   how much to take  when to take this  additional instructions   aspirin EC 81 MG tablet Take 81 mg by mouth daily.   butalbital-acetaminophen-caffeine 50-325-40 MG tablet Commonly known as: FIORICET Take 2 tablets every 4 (four) hours as needed by mouth for headache.   colchicine 0.6 MG tablet Take 0.6 mg by mouth every 6 (six) hours as needed (gout flares). Mitigare   D 1000 25 MCG (1000 UT) capsule Generic drug: Cholecalciferol Take 1,000 Units by mouth daily.   escitalopram 20 MG tablet Commonly known as: LEXAPRO Take 20 mg by mouth daily.   finasteride 5 MG tablet Commonly known as: PROSCAR Take 5 mg by mouth at bedtime.   HYDROcodone-homatropine 5-1.5 MG/5ML syrup Commonly known as: HYCODAN Take 5 mLs by mouth every 6 (six) hours as needed for cough.   ipratropium-albuterol 0.5-2.5 (3) MG/3ML Soln Commonly  known as: DUONEB Inhale 3 mLs into the lungs every 6 (six) hours. What changed:   when to take this  reasons to take this   morphine 10 MG/5ML solution Take 1.3 mLs (2.6 mg total) by mouth every 2 (two) hours as needed for up to 5 days for severe pain (shortness of breath).   OXYGEN Inhale 5 L into the lungs continuous.   pantoprazole 40 MG tablet Commonly known as: PROTONIX Take 40 mg by mouth daily. Received from [facility]   pravastatin 20 MG tablet Commonly known as: PRAVACHOL TAKE 1 TABLET BY MOUTH AT BEDTIME   predniSONE 20 MG tablet Commonly known as: DELTASONE Take 3 PO QAM x5days, 2 PO QAM x5days, 1 PO QAM x5days   Spiriva Respimat 2.5 MCG/ACT Aers Generic drug: Tiotropium Bromide Monohydrate Inhale 1 puff into the lungs daily.   Symbicort 160-4.5 MCG/ACT inhaler Generic drug: budesonide-formoterol Inhale 1 puff into the lungs 2 (two) times daily.   tamsulosin 0.4 MG Caps capsule Commonly known as: FLOMAX Take 0.4 mg by mouth daily. Received from [place] 200-62.5-25 MCG/INH Aepb Generic drug: Fluticasone-Umeclidin-Vilant Inhale into the lungs.      Follow-up Information    Chesley Noon, MD. Schedule an appointment as soon as possible for a visit in 2 week(s).   Specialty: Family Medicine Contact information: [address] [place] [address] [phone]  Fay Records, MD .   Specialty: Cardiology Contact information: [address] [place] [address] [phone]          Allergies  Allergen Reactions  . Lorazepam Other (See Comments)    hallucinations 06/28/19--tolerated IV ativan--without any rash, hives, angioedema or any anaphlylactoid response   Allergies as of 07/03/2019      Reactions   Lorazepam Other (See Comments)   hallucinations 06/28/19--tolerated IV ativan--without any rash, hives, angioedema or any anaphlylactoid response      Medication List    STOP  taking these medications   BC Headache 325-95-16 MG Tabs Generic drug: Aspirin-Salicylamide-Caffeine   lisinopril 10 MG tablet Commonly known as: ZESTRIL     TAKE these medications   albuterol 108 (90 Base) MCG/ACT inhaler Commonly known as: VENTOLIN HFA Inhale 2 puffs into the lungs every 6 (six) hours as needed for wheezing or shortness of breath.   ALPRAZolam 1 MG tablet Commonly known as: XANAX Take 1 tablet (1 mg total) by mouth 3 (three) times daily as needed for anxiety (sleep). What changed:   how much to take  when to take this  additional instructions   aspirin EC 81 MG tablet Take 81 mg by mouth daily.   butalbital-acetaminophen-caffeine 50-325-40 MG tablet Commonly known as: FIORICET Take 2 tablets every 4 (four) hours as needed by mouth for headache.   colchicine 0.6 MG tablet Take 0.6 mg by mouth every 6 (six) hours as needed (gout flares). Mitigare   D 1000 25 MCG (1000 UT) capsule Generic drug: Cholecalciferol Take 1,000 Units by mouth daily.   escitalopram 20 MG tablet Commonly known as: LEXAPRO Take 20 mg by mouth daily.   finasteride 5 MG tablet Commonly known as: PROSCAR Take 5 mg by mouth at bedtime.   HYDROcodone-homatropine 5-1.5 MG/5ML syrup Commonly known as: HYCODAN Take 5 mLs by mouth every 6 (six) hours as needed for cough.   ipratropium-albuterol 0.5-2.5 (3) MG/3ML Soln Commonly known as: DUONEB Inhale 3 mLs into the lungs every 6 (six) hours. What changed:   when to take this  reasons to take this   morphine 10 MG/5ML solution Take 1.3 mLs (2.6 mg total) by mouth every 2 (two) hours as needed for up to 5 days for severe pain (shortness of breath).   OXYGEN Inhale 5 L into the lungs continuous.   pantoprazole 40 MG tablet Commonly known as: PROTONIX Take 40 mg by mouth daily. Received from [place] [facility]   pravastatin 20 MG tablet Commonly known as: PRAVACHOL TAKE 1 TABLET BY MOUTH AT BEDTIME   predniSONE 20 MG  tablet Commonly known as: DELTASONE Take 3 PO QAM x5days, 2 PO QAM x5days, 1 PO QAM x5days   Spiriva Respimat 2.5 MCG/ACT Aers Generic drug: Tiotropium Bromide Monohydrate Inhale 1 puff into the lungs daily.   Symbicort 160-4.5 MCG/ACT inhaler Generic drug: budesonide-formoterol Inhale 1 puff into the lungs 2 (two) times daily.   tamsulosin 0.4 MG Caps capsule Commonly known as: FLOMAX Take 0.4 mg by mouth daily. Received from [place] 200-62.5-25 MCG/INH Aepb Generic drug: Fluticasone-Umeclidin-Vilant Inhale into the lungs.       Procedures/Studies: DG CHEST PORT 1 VIEW  Result Date: 06/30/2019 CLINICAL DATA:  COPD exacerbation EXAM: PORTABLE CHEST 1 VIEW COMPARISON:  Portable exam 1314 hours compared to 06/27/2019 FINDINGS: Normal heart size and pulmonary vascularity. Emphysematous and central bronchitic changes consistent with COPD. Extensive bullous disease and hyperinflation of the lower  RIGHT lung with mediastinal shift to the LEFT. Chronic accentuation of interstitial markings RIGHT upper lobe and throughout LEFT lung. No definite acute infiltrate, pleural effusion or pneumothorax. Tiny nodular density in the lower RIGHT lung seen on the previous exam not identified. Bones demineralized. IMPRESSION: Changes of COPD and chronic interstitial prominence with advanced bullous changes of the lower RIGHT lung and persistent mediastinal shift to LEFT. No acute infiltrate. Electronically Signed   By: Lavonia Dana M.D.   On: 06/30/2019 13:31   DG Chest Port 1 View  Result Date: 06/27/2019 CLINICAL DATA:  69 year old male with shortness of breath. EXAM: PORTABLE CHEST 1 VIEW COMPARISON:  Chest radiograph dated 09/04/2017 and CT dated 03/07/2018. FINDINGS: There is severe emphysema with severe bullous changes in the right lung base. No focal consolidation, pleural effusion, pneumothorax. The cardiac silhouette is within normal limits. There is prominence of the central  pulmonary arteries suggestive of a degree of pulmonary hypertension. Clinical correlation is recommended. A 7 mm nodular density over the right mid lung field likely represents nipple shadow. No acute osseous pathology. IMPRESSION: 1. No acute cardiopulmonary process. 2. Severe emphysema with severe bullous changes in the right lung base. Electronically Signed   By: Anner Crete M.D.   On: 06/27/2019 16:13      Subjective: Pt says he feels much better and wants to go home.  He is agreeable to home hospice.    Discharge Exam: Vitals:   07/03/19 0804 07/03/19 0815  BP:    Pulse:    Resp:    Temp:    SpO2: 100% 98%   Vitals:   07/03/19 0800 07/03/19 0801 07/03/19 0804 07/03/19 0815  BP:      Pulse:      Resp: 20     Temp:      TempSrc:      SpO2:  100% 100% 98%  Weight:      Height:       General exam: Appears calm and comfortable does not appear to be in any distress and speaking in full sentences. Respiratory system: Shallow breathing poor air movement no rales heard no crackles heard. Respiratory effort normal. Cardiovascular system: S1 & S2 heard, RRR. No JVD, murmurs, rubs, gallops or clicks. No pedal edema. Gastrointestinal system: Abdomen is nondistended, soft and nontender. No organomegaly or masses felt. Normal bowel sounds heard. Central nervous system: Alert and oriented. No focal neurological deficits. Extremities: Symmetric 5 x 5 power. Skin: No rashes, lesions or ulcers Psychiatry: Judgement and insight appear normal. Mood & affect appropriate.   The results of significant diagnostics from this hospitalization (including imaging, microbiology, ancillary and laboratory) are listed below for reference.     Microbiology: Recent Results (from the past 240 hour(s))  Respiratory Panel by RT PCR (Flu A&B, Covid) - Nasopharyngeal Swab     Status: None   Collection Time: 06/27/19  7:09 PM   Specimen: Nasopharyngeal Swab  Result Value Ref Range Status   SARS  Coronavirus 2 by RT PCR NEGATIVE NEGATIVE Final    Comment: (NOTE) SARS-CoV-2 target nucleic acids are NOT DETECTED. The SARS-CoV-2 RNA is generally detectable in upper respiratoy specimens during the acute phase of infection. The lowest concentration of SARS-CoV-2 viral copies this assay can detect is 131 copies/mL. A negative result does not preclude SARS-Cov-2 infection and should not be used as the sole basis for treatment or other patient management decisions. A negative result may occur with  improper specimen collection/handling, submission of specimen other  than nasopharyngeal swab, presence of viral mutation(s) within the areas targeted by this assay, and inadequate number of viral copies (<131 copies/mL). A negative result must be combined with clinical observations, patient history, and epidemiological information. The expected result is Negative. Fact Sheet for Patients:  PinkCheek.be Fact Sheet for Healthcare Providers:  GravelBags.it This test is not yet ap proved or cleared by the [place] FDA and  has been authorized for detection and/or diagnosis of SARS-CoV-2 by FDA under an Emergency Use Authorization (EUA). This EUA will remain  in effect (meaning this test can be used) for the duration of the COVID-19 declaration under Section 564(b)(1) of the Act, 21 U.S.C. section 360bbb-3(b)(1), unless the authorization is terminated or revoked sooner.    Influenza A by PCR NEGATIVE NEGATIVE Final   Influenza B by PCR NEGATIVE NEGATIVE Final    Comment: (NOTE) The Xpert Xpress SARS-CoV-2/FLU/RSV assay is intended as an aid in  the diagnosis of influenza from Nasopharyngeal swab specimens and  should not be used as a sole basis for treatment. Nasal washings and  aspirates are unacceptable for Xpert Xpress SARS-CoV-2/FLU/RSV  testing. Fact Sheet for Patients: PinkCheek.be Fact Sheet  for Healthcare Providers: GravelBags.it This test is not yet approved or cleared by the [place] FDA and  has been authorized for detection and/or diagnosis of SARS-CoV-2 by  FDA under an Emergency Use Authorization (EUA). This EUA will remain  in effect (meaning this test can be used) for the duration of the  Covid-19 declaration under Section 564(b)(1) of the Act, 21  U.S.C. section 360bbb-3(b)(1), unless the authorization is  terminated or revoked. Performed at [facility], [address], [place], [place] [address]   MRSA PCR Screening     Status: None   Collection Time: 06/27/19  9:50 PM   Specimen: Nasal Mucosa; Nasopharyngeal  Result Value Ref Range Status   MRSA by PCR NEGATIVE NEGATIVE Final    Comment:        The GeneXpert MRSA Assay (FDA approved for NASAL specimens only), is one component of a comprehensive MRSA colonization surveillance program. It is not intended to diagnose MRSA infection nor to guide or monitor treatment for MRSA infections. Performed at [facility], [address] [place], [place] [address]      Labs: BNP (last 3 results) No results for input(s): BNP in the last 8760 hours. Basic Metabolic Panel: Recent Labs  Lab 06/28/19 0418 06/29/19 0416 06/30/19 0347 07/01/19 0401 07/02/19 0402  NA 133* 135 135 135 136  K 4.9 4.7 4.5 4.3 4.6  CL 94* 95* 94* 94* 93*  CO2 31 29 31 31  33*  GLUCOSE 133* 127* 121* 129* 154*  BUN 21 30* 37* 29* 33*  CREATININE 0.63 0.71 0.78 0.54* 0.70  CALCIUM 8.9 9.1 9.0 8.7* 8.8*  MG  --  2.3 2.3 2.4 2.5*  PHOS  --   --   --  3.7 3.8   Liver Function Tests: No results for input(s): AST, ALT, ALKPHOS, BILITOT, PROT, ALBUMIN in the last 168 hours. No results for input(s): LIPASE, AMYLASE in the last 168 hours. No results for input(s): AMMONIA in the last 168 hours. CBC: Recent Labs  Lab 06/27/19 1540 06/28/19 0418 06/29/19 0416 06/30/19 0347  WBC 7.2 5.6 6.7 10.3  HGB  12.1* 10.9* 11.4* 11.0*  HCT 40.5 35.7* 37.3* 36.0*  MCV 89.2 86.7 88.0 87.4  PLT 301 318 319 342   Cardiac Enzymes: No results for input(s): CKTOTAL, CKMB, CKMBINDEX, TROPONINI  in the last 168 hours. BNP: Invalid input(s): POCBNP CBG: No results for input(s): GLUCAP in the last 168 hours. D-Dimer No results for input(s): DDIMER in the last 72 hours. Hgb A1c No results for input(s): HGBA1C in the last 72 hours. Lipid Profile No results for input(s): CHOL, HDL, LDLCALC, TRIG, CHOLHDL, LDLDIRECT in the last 72 hours. Thyroid function studies No results for input(s): TSH, T4TOTAL, T3FREE, THYROIDAB in the last 72 hours.  Invalid input(s): FREET3 Anemia work up No results for input(s): VITAMINB12, FOLATE, FERRITIN, TIBC, IRON, RETICCTPCT in the last 72 hours. Urinalysis    Component Value Date/Time   COLORURINE YELLOW 06/27/2019 1622   APPEARANCEUR CLEAR 06/27/2019 1622   LABSPEC 1.015 06/27/2019 1622   PHURINE 7.0 06/27/2019 1622   GLUCOSEU NEGATIVE 06/27/2019 1622   HGBUR MODERATE (A) 06/27/2019 1622   BILIRUBINUR NEGATIVE 06/27/2019 1622   KETONESUR NEGATIVE 06/27/2019 1622   PROTEINUR NEGATIVE 06/27/2019 1622   UROBILINOGEN 1.0 12/13/2014 1248   NITRITE NEGATIVE 06/27/2019 1622   LEUKOCYTESUR NEGATIVE 06/27/2019 1622   Sepsis Labs Invalid input(s): PROCALCITONIN,  WBC,  LACTICIDVEN Microbiology Recent Results (from the past 240 hour(s))  Respiratory Panel by RT PCR (Flu A&B, Covid) - Nasopharyngeal Swab     Status: None   Collection Time: 06/27/19  7:09 PM   Specimen: Nasopharyngeal Swab  Result Value Ref Range Status   SARS Coronavirus 2 by RT PCR NEGATIVE NEGATIVE Final    Comment: (NOTE) SARS-CoV-2 target nucleic acids are NOT DETECTED. The SARS-CoV-2 RNA is generally detectable in upper respiratoy specimens during the acute phase of infection. The lowest concentration of SARS-CoV-2 viral copies this assay can detect is 131 copies/mL. A negative result does not  preclude SARS-Cov-2 infection and should not be used as the sole basis for treatment or other patient management decisions. A negative result may occur with  improper specimen collection/handling, submission of specimen other than nasopharyngeal swab, presence of viral mutation(s) within the areas targeted by this assay, and inadequate number of viral copies (<131 copies/mL). A negative result must be combined with clinical observations, patient history, and epidemiological information. The expected result is Negative. Fact Sheet for Patients:  PinkCheek.be Fact Sheet for Healthcare Providers:  GravelBags.it This test is not yet ap proved or cleared by the [place] FDA and  has been authorized for detection and/or diagnosis of SARS-CoV-2 by FDA under an Emergency Use Authorization (EUA). This EUA will remain  in effect (meaning this test can be used) for the duration of the COVID-19 declaration under Section 564(b)(1) of the Act, 21 U.S.C. section 360bbb-3(b)(1), unless the authorization is terminated or revoked sooner.    Influenza A by PCR NEGATIVE NEGATIVE Final   Influenza B by PCR NEGATIVE NEGATIVE Final    Comment: (NOTE) The Xpert Xpress SARS-CoV-2/FLU/RSV assay is intended as an aid in  the diagnosis of influenza from Nasopharyngeal swab specimens and  should not be used as a sole basis for treatment. Nasal washings and  aspirates are unacceptable for Xpert Xpress SARS-CoV-2/FLU/RSV  testing. Fact Sheet for Patients: PinkCheek.be Fact Sheet for Healthcare Providers: GravelBags.it This test is not yet approved or cleared by the [place] FDA and  has been authorized for detection and/or diagnosis of SARS-CoV-2 by  FDA under an Emergency Use Authorization (EUA). This EUA will remain  in effect (meaning this test can be used) for the duration of the   Covid-19 declaration under Section 564(b)(1) of the Act, 21  U.S.C. section 360bbb-3(b)(1), unless the authorization  is  terminated or revoked. Performed at [facility], [address], [place], [place] [address]   MRSA PCR Screening     Status: None   Collection Time: 06/27/19  9:50 PM   Specimen: Nasal Mucosa; Nasopharyngeal  Result Value Ref Range Status   MRSA by PCR NEGATIVE NEGATIVE Final    Comment:        The GeneXpert MRSA Assay (FDA approved for NASAL specimens only), is one component of a comprehensive MRSA colonization surveillance program. It is not intended to diagnose MRSA infection nor to guide or monitor treatment for MRSA infections. Performed at [facility], [address], [place], [place] [address]    Time coordinating discharge: 32 minutes   SIGNED:  Irwin Brakeman, MD  [facility] 07/03/2019, 10:50 AM How to contact the [facility] Attending or Consulting provider [place] or covering provider during after hours [place], for this patient?  1. Check the care team in [facility] and look for a) attending/consulting [facility] provider listed and b) the [facility] team listed 2. Log into [email or web address] and use [place]'s universal password to access. If you do not have the password, please contact the hospital operator. 3. Locate the [facility] provider you are looking for under [facility] and page to a number that you can be directly reached. 4. If you still have difficulty reaching the provider, please page the [facility] (Director on Call) for the Hospitalists listed on amion for assistance.

## 2019-07-03 NOTE — TOC Transition Note (Signed)
Transition of Care [facility]) - CM/SW Discharge Note   Patient Details  Name: Jay Mullins MRN: [id number] Date of Birth: Oct 25, 1950  Transition of Care [facility]) CM/SW Contact:  Shade Flood, LCSW Phone Number: 07/03/2019, 11:46 AM   Clinical Narrative:     Pt stable for dc home with hospice today per MD. Pt and family have selected [facility]. Updated Eric at [employer] [facility] [employer] plan with transport requested for 3pm. Provided Bipap settings to Randall Hiss and also relayed request from family to add a BSC to pt's DME order.  Updated RN. There are no other TOC needs for dc.  Final next level of care: Home w Hospice Care Barriers to Discharge: Barriers Resolved   Patient Goals and CMS Choice Patient states their goals for this hospitalization and ongoing recovery are:: Return home with hospice services CMS Medicare.gov Compare Post Acute Care list provided to:: Patient Choice offered to / list presented to : Patient  Discharge Placement                       Discharge Plan and Services In-house Referral: Chaplain   Post Acute Care Choice: Hospice([facility])                               Social Determinants of Health (SDOH) Interventions     Readmission Risk Interventions No flowsheet data found.

## 2019-07-03 NOTE — Progress Notes (Signed)
Palliative: Jay Mullins is sitting up in the bed.  He appears acutely/chronically ill and frail, older than stated age.  His significant other of 15 years, Jay Mullins and her daughter are at bedside.  We talked about discharge today with hospice.  Equipment needs discussed.  Jay Mullins has completed healthcare power of attorney paperwork naming his significant other, Jay Mullins as his Ambulance person.  He kept the original, a copy will be filed in medical records.  No further questions or concerns at this time.  Anticipate discharge today.  Conference with attending, bedside nursing staff, chaplain, transition of care team related to patient condition, needs, goals of care.  Plan:    Home with hospice care for treat the treatable care at this time.  DNR status.   31 minutes Jay Axe, NP Palliative Medicine Team Team Phone # [phone] Greater than 50% of this time was spent counseling and coordination care related to the above assessment and plan.

## 2019-11-28 ENCOUNTER — Other Ambulatory Visit: Payer: Self-pay | Admitting: Internal Medicine

## 2020-01-17 ENCOUNTER — Other Ambulatory Visit: Payer: Self-pay | Admitting: Internal Medicine

## 2020-02-04 DEATH — deceased
